# Patient Record
Sex: Male | Born: 1970 | Race: White | Hispanic: No | Smoking: Current every day smoker
Health system: Southern US, Community
[De-identification: ages and names within clinical notes are randomized; demographics above are authoritative.]

## PROBLEM LIST (undated history)

## (undated) DIAGNOSIS — K5792 Diverticulitis of intestine, part unspecified, without perforation or abscess without bleeding: Secondary | ICD-10-CM

## (undated) DIAGNOSIS — G4730 Sleep apnea, unspecified: Secondary | ICD-10-CM

## (undated) DIAGNOSIS — G2581 Restless legs syndrome: Secondary | ICD-10-CM

## (undated) DIAGNOSIS — E785 Hyperlipidemia, unspecified: Secondary | ICD-10-CM

## (undated) DIAGNOSIS — F419 Anxiety disorder, unspecified: Secondary | ICD-10-CM

## (undated) DIAGNOSIS — I2510 Atherosclerotic heart disease of native coronary artery without angina pectoris: Principal | ICD-10-CM

## (undated) DIAGNOSIS — F32A Depression, unspecified: Secondary | ICD-10-CM

## (undated) DIAGNOSIS — M1990 Unspecified osteoarthritis, unspecified site: Secondary | ICD-10-CM

## (undated) DIAGNOSIS — I219 Acute myocardial infarction, unspecified: Secondary | ICD-10-CM

## (undated) DIAGNOSIS — R7303 Prediabetes: Secondary | ICD-10-CM

## (undated) DIAGNOSIS — J449 Chronic obstructive pulmonary disease, unspecified: Secondary | ICD-10-CM

## (undated) DIAGNOSIS — I252 Old myocardial infarction: Secondary | ICD-10-CM

## (undated) HISTORY — DX: Atherosclerotic heart disease of native coronary artery without angina pectoris: I25.10

## (undated) HISTORY — PX: CARDIAC CATHETERIZATION: SHX172

## (undated) HISTORY — DX: Anxiety disorder, unspecified: F41.9

## (undated) HISTORY — DX: Unspecified osteoarthritis, unspecified site: M19.90

## (undated) HISTORY — DX: Old myocardial infarction: I25.2

## (undated) HISTORY — DX: Sleep apnea, unspecified: G47.30

## (undated) HISTORY — DX: Diverticulitis of intestine, part unspecified, without perforation or abscess without bleeding: K57.92

## (undated) HISTORY — DX: Depression, unspecified: F32.A

## (undated) HISTORY — DX: Restless legs syndrome: G25.81

## (undated) HISTORY — DX: Hyperlipidemia, unspecified: E78.5

---

## 2014-01-23 HISTORY — PX: COLONOSCOPY: SHX174

## 2015-04-11 ENCOUNTER — Ambulatory Visit (INDEPENDENT_AMBULATORY_CARE_PROVIDER_SITE_OTHER): Payer: PRIVATE HEALTH INSURANCE

## 2015-04-11 ENCOUNTER — Ambulatory Visit (INDEPENDENT_AMBULATORY_CARE_PROVIDER_SITE_OTHER): Payer: PRIVATE HEALTH INSURANCE | Admitting: Sports Medicine

## 2015-04-11 ENCOUNTER — Encounter: Payer: Self-pay | Admitting: Sports Medicine

## 2015-04-11 DIAGNOSIS — B079 Viral wart, unspecified: Secondary | ICD-10-CM

## 2015-04-11 DIAGNOSIS — M79671 Pain in right foot: Secondary | ICD-10-CM

## 2015-04-11 DIAGNOSIS — M899 Disorder of bone, unspecified: Secondary | ICD-10-CM

## 2015-04-11 DIAGNOSIS — L603 Nail dystrophy: Secondary | ICD-10-CM | POA: Diagnosis not present

## 2015-04-11 NOTE — Progress Notes (Signed)
Patient ID: Billy Cummings, male   DOB: Aug 30, 1970, 44 y.o.   MRN: [id number] Subjective: Billy Cummings is a 44 y.o. male patient who presents to office for evaluation of Right foot pain. Patient complains of progressive pain especially over the last few months at the second toe where there is a callus lesion, which he routinely trims himself to alleviate pain. Patient also states that the lesion tends to build up and never appears to be getting better. Patient also admits to pain in ball of foot. States that this has been going on for several months as well made worse with long hours of standing admits to no treatment for this concern. Patient is also concerned of nail thickness and discoloration, especially at the big toes and fourth and fifth toes. Patient has tried over-the-counter medications with no improvement in appearance of nails. Patient denies any other pedal complaints. Denies injury/trip/fall/sprain/any causative factors.   Review of Systems  All other systems reviewed and are negative.  There are no active problems to display for this patient.  No current outpatient prescriptions on file prior to visit.   No current facility-administered medications on file prior to visit.   No Known Allergies   Objective:  General: Alert and oriented x3 in no acute distress  Dermatology: There is a less than 0.5 cm keratotic lesion once pared revealing multiple pinpoint capillaries suggestive of wart at the plantar aspect of the right second toe, No open lesions bilateral lower extremities, no webspace macerations, no ecchymosis bilateral, all nails x 10 are mildly dystrophic, thickened, yellow, with most thickening noted at bilateral hallux and fourth and fifth toes. There is also generalized dryness and peeling skin however asymptomatic possible tinea.   Vascular: Dorsalis Pedis and Posterior Tibial pedal pulses 2/4, Capillary Fill Time 3 seconds,(+) pedal hair growth bilateral, no edema bilateral  lower extremities, Temperature gradient within normal limits.  Neurology: Gross sensation intact via light touch bilateral, No babinski sign present bilateral. (- )Tinels sign.   Musculoskeletal: There is moderate tenderness to palpation of the right second toe at area of warty lesion Mild tenderness with palpation at fibular sesamoid, right foot,No pain with calf compression bilateral. Ankle and pedal joint range of motion within normal limits. Strength within normal limits in all groups bilateral.   Xrays  Right Foot 3 views    Impression: Normal osseous mineralization. Joint spaces well preserved. There is mild narrowing at the sesamoid complex and not clear visualization of the grooves that the sesamoid sits in underneath the first metatarsal head, likely suggestive of arthritis. There are no fractures or dislocations. Mild hammertoe deformity present. Soft tissues within normal limits, no foreign body.  Assessment and Plan: Problem List Items Addressed This Visit    None    Visit Diagnoses    Right foot pain    -  Primary    Relevant Orders    DG Foot 2 Views Right    Viral warts        Right second toe    Relevant Orders    Fungus Culture with Smear    Sesamoid pain        Likely secondary to mechanical overload versus arthritis    Nail dystrophy        Possible onychomycosis    Relevant Orders    Fungus Culture with Smear       -Complete examination performed -Xrays reviewed -Discussed treatement options -Mechanically debrided warty lesion using a sterile chisel blade at right second  toe until pinpoint bleeding and applied chemical Cantharone covered with Band-Aid. Advised patient on blistering reaction and appropriate care. -Patient metatarsal pad for ball of foot/ sesamoid pain with instructions on use and application. Informed patient that if these pads work well that we will make custom orthotic with sesamoid accommodation. -Fungal Culture was obtained from all nails and  sent to lab for analysis for possible onychomycosis; will further discuss treatment options for fungus pending lab results -Recommend good supportive shoes daily for foot type. -Patient to return to office in 2 weeks or sooner if condition worsens.  Landis Martins, DPM

## 2015-04-25 ENCOUNTER — Ambulatory Visit (INDEPENDENT_AMBULATORY_CARE_PROVIDER_SITE_OTHER): Payer: PRIVATE HEALTH INSURANCE | Admitting: Sports Medicine

## 2015-04-25 ENCOUNTER — Encounter: Payer: Self-pay | Admitting: Sports Medicine

## 2015-04-25 DIAGNOSIS — B079 Viral wart, unspecified: Secondary | ICD-10-CM

## 2015-04-25 DIAGNOSIS — M899 Disorder of bone, unspecified: Secondary | ICD-10-CM

## 2015-04-25 DIAGNOSIS — B351 Tinea unguium: Secondary | ICD-10-CM

## 2015-04-25 DIAGNOSIS — M79671 Pain in right foot: Secondary | ICD-10-CM | POA: Diagnosis not present

## 2015-04-25 DIAGNOSIS — B070 Plantar wart: Secondary | ICD-10-CM | POA: Diagnosis not present

## 2015-04-25 NOTE — Progress Notes (Signed)
Patient ID: Billy Cummings, male   DOB: 26-Sep-1970, 45 y.o.   MRN: [id number]  Subjective: Billy Cummings is a 45 y.o. male patient who returns to office for follow up evaluation of Right foot pain. Patient states that wart site is feeling better and that he got the blister reaction and applied neosporin and bandaid with no problems. Patient states that met pad helped pain in ball of foot. Also is here for Fungal culture results. No other issues.   There are no active problems to display for this patient.  No current outpatient prescriptions on file prior to visit.   No current facility-administered medications on file prior to visit.   No Known Allergies   Objective:  General: Alert and oriented x3 in no acute distress  Dermatology: There is a less than 0.4 cm keratotic lesion once pared revealing multiple pinpoint capillaries suggestive of wart at the plantar aspect of the right second toe, No open lesions bilateral lower extremities, no webspace macerations, no ecchymosis bilateral, all nails x 10 are mildly dystrophic, thickened, yellow, with most thickening noted at bilateral hallux and fourth and fifth toes. There is also generalized dryness and peeling skin however asymptomatic possible tinea.   Vascular: Dorsalis Pedis and Posterior Tibial pedal pulses 2/4, Capillary Fill Time 3 seconds,(+) pedal hair growth bilateral, no edema bilateral lower extremities, Temperature gradient within normal limits.  Neurology: Gross sensation intact via light touch bilateral, No babinski sign present bilateral. (- )Tinels sign.   Musculoskeletal: There is decreased tenderness to palpation of the right second toe at area of warty lesion, No tenderness with palpation at fibular sesamoid, right foot,No pain with calf compression bilateral. Ankle and pedal joint range of motion within normal limits. Strength within normal limits in all groups bilateral.   Assessment and Plan: Problem List Items Addressed This  Visit    None    Visit Diagnoses    Right foot pain    -  Primary    Viral warts        R 2nd toe    Sesamoid pain        resolved    Dermatophytosis of nail        + Fungal culture, T. Rubrum     Relevant Orders    Hepatic Function Panel    CBC with Differential    Basic Metabolic Panel      -Complete examination performed -Discussed treatement options -Fungal culture results reviewed; ordered labs and will review to start Lamisil if within normal limits; will call patient. -Mechanically debrided warty lesion using a sterile chisel blade at right second toe until pinpoint bleeding and applied chemical Cantharone covered with Band-Aid. Advised patient on blistering reaction and appropriate care. -Gave patient metatarsal pad for ball of foot/ sesamoid pain with instructions on use and application. Office to check orthotic insurance coverage and will consider scanning at next visit and adding met pad accomodation. -Recommend good supportive shoes daily for foot type. -Patient to return to office in 4-6 weeks or sooner if condition worsens.  Landis Martins, DPM

## 2015-05-15 ENCOUNTER — Encounter: Payer: Self-pay | Admitting: Sports Medicine

## 2015-05-22 ENCOUNTER — Telehealth: Payer: Self-pay | Admitting: Sports Medicine

## 2015-05-22 NOTE — Telephone Encounter (Signed)
Discussed blood-work with patient; elevated WBC; patient reports that he had his labs work reviewed by [employer] who thinks that elevation is from Smoking; patient is to have repeat blood-work done in the next few weeks of which he will bring with him at next appointment for further review and consideration to start Lamisil. -Dr. Cannon Kettle

## 2015-06-06 ENCOUNTER — Ambulatory Visit: Payer: PRIVATE HEALTH INSURANCE | Admitting: Sports Medicine

## 2015-08-15 HISTORY — PX: CORONARY ANGIOPLASTY WITH STENT PLACEMENT: SHX49

## 2015-08-29 DIAGNOSIS — F17210 Nicotine dependence, cigarettes, uncomplicated: Secondary | ICD-10-CM

## 2015-08-29 DIAGNOSIS — I2510 Atherosclerotic heart disease of native coronary artery without angina pectoris: Secondary | ICD-10-CM | POA: Insufficient documentation

## 2015-08-29 DIAGNOSIS — E785 Hyperlipidemia, unspecified: Secondary | ICD-10-CM

## 2015-08-29 HISTORY — DX: Nicotine dependence, cigarettes, uncomplicated: F17.210

## 2015-08-29 HISTORY — DX: Hyperlipidemia, unspecified: E78.5

## 2015-08-29 HISTORY — DX: Atherosclerotic heart disease of native coronary artery without angina pectoris: I25.10

## 2017-04-08 ENCOUNTER — Ambulatory Visit (INDEPENDENT_AMBULATORY_CARE_PROVIDER_SITE_OTHER): Payer: No Typology Code available for payment source | Admitting: Cardiology

## 2017-04-08 ENCOUNTER — Encounter: Payer: Self-pay | Admitting: *Deleted

## 2017-04-08 ENCOUNTER — Other Ambulatory Visit: Payer: Self-pay | Admitting: *Deleted

## 2017-04-08 VITALS — BP 124/80 | HR 88 | Ht 74.0 in | Wt 215.0 lb

## 2017-04-08 DIAGNOSIS — I2510 Atherosclerotic heart disease of native coronary artery without angina pectoris: Secondary | ICD-10-CM | POA: Diagnosis not present

## 2017-04-08 DIAGNOSIS — E785 Hyperlipidemia, unspecified: Secondary | ICD-10-CM | POA: Diagnosis not present

## 2017-04-08 DIAGNOSIS — J020 Streptococcal pharyngitis: Secondary | ICD-10-CM | POA: Diagnosis not present

## 2017-04-08 DIAGNOSIS — R079 Chest pain, unspecified: Secondary | ICD-10-CM

## 2017-04-08 DIAGNOSIS — F17210 Nicotine dependence, cigarettes, uncomplicated: Secondary | ICD-10-CM

## 2017-04-08 NOTE — Patient Instructions (Signed)
Medication Instructions:  Your physician recommends that you continue on your current medications as directed. Please refer to the Current Medication list given to you today.  Labwork: None  Testing/Procedures: Your physician has requested that you have a lexiscan myoview. For further information please visit [email or web address]. Please follow instruction sheet, as given.   Follow-Up: Your physician recommends that you schedule a follow-up appointment in: 1 month  Any Other Special Instructions Will Be Listed Below (If Applicable).     If you need a refill on your cardiac medications before your next appointment, please call your pharmacy.   [place], RN, BSN

## 2017-04-08 NOTE — Progress Notes (Signed)
Cardiology Office Note:    Date:  04/08/2017   ID:  Billy Cummings, DOB 31-Aug-1970, MRN [id number]  PCP:  System, Pcp Not In  Cardiologist:  Jenean Lindau, MD   Referring MD: No ref. provider found    ASSESSMENT:    1. Coronary artery disease involving native coronary artery of native heart without angina pectoris   2. Strep throat   3. Cigarette smoker   4. Dyslipidemia   5. Chest pain, unspecified type    PLAN:    In order of problems listed above:  1. Secondary prevention stressed with the patient.  Importance of compliance with diet and medications stressed and he vocalized understanding. 2. I spent 5 minutes with the patient discussing solely about smoking. Smoking cessation was counseled. I suggested to the patient also different medications and pharmacological interventions. Patient is keen to try stopping on its own at this time. He will get back to me if he needs any further assistance in this matter. 3. His blood pressure stable I told him to keep himself well-hydrated. 4. I mentioned to the patient that he will need some form of stress testing to evaluate for objective evidence of coronary artery disease.  He is agreeable and vocalized understanding. 5. His lipids and other blood work was checked at his workplace and he will get me a copy of these when he comes to see me in follow-up appointment in a month. 6. He knows to go to the nearest emergency room for any concerning symptoms.   Medication Adjustments/Labs and Tests Ordered: Current medicines are reviewed at length with the patient today.  Concerns regarding medicines are outlined above.  Orders Placed This Encounter  Procedures  . MYOCARDIAL PERFUSION IMAGING   No orders of the defined types were placed in this encounter.    History of Present Illness:    Billy Cummings is a 46 y.o. male who is being seen today for the evaluation of chest pain at the request of No ref. provider found.  This patient has been  under my care in my previous practice.  He is here now to transfer his care and be established with my current practice.  Patient has known coronary artery disease and has coronary stenting and restenting for the restenosis.  Unfortunately continues to smoke.  Is an active gentleman.  He does not exercise on a regular basis.  He tells me that he has chest tightness at times.  This is without any exercise.  When he is active and ambulating or sexually active he does not have any chest pain.  No orthopnea or PND.  No syncope.  No radiation of the symptoms to the neck or to any part of the body.  At the time of my evaluation, the patient is alert awake oriented and in no distress.  Past Medical History:  Diagnosis Date  . Coronary artery disease involving native coronary artery of native heart without angina pectoris 08/29/2015   Overview:  STEMI -late restenosis ,inferior wall--stenting of RCA x 2 08/15/15 and old PCI and stent of RCA in 2012  . Dyslipidemia 08/29/2015    Past Surgical History:  Procedure Laterality Date  . CORONARY ANGIOPLASTY WITH STENT PLACEMENT Left 08/15/2015    Current Medications: Current Meds  Medication Sig  . amoxicillin-clavulanate (AUGMENTIN) 875-125 MG tablet Take 1 tablet by mouth 2 (two) times daily. For 10 days  . aspirin 81 MG chewable tablet Chew 81 mg by mouth.  Marland Kitchen atorvastatin (LIPITOR) 40  MG tablet Take 1 tablet once a day for high cholesterol  . BRILINTA 90 MG TABS tablet Take 90 mg by mouth 2 (two) times daily.  . Coenzyme Q-10 200 MG CAPS Take 200 mg by mouth.  . fenofibrate 160 MG tablet Take 160 mg by mouth daily.     Allergies:   Patient has no known allergies.   Social History   Socioeconomic History  . Marital status: Single    Spouse name: None  . Number of children: None  . Years of education: None  . Highest education level: None  Social Needs  . Financial resource strain: None  . Food insecurity - worry: None  . Food insecurity -  inability: None  . Transportation needs - medical: None  . Transportation needs - non-medical: None  Occupational History  . None  Tobacco Use  . Smoking status: Current Every Day Smoker  . Smokeless tobacco: Never Used  Substance and Sexual Activity  . Alcohol use: No    Alcohol/week: 0.0 oz    Frequency: Never  . Drug use: No  . Sexual activity: None  Other Topics Concern  . None  Social History Narrative  . None     Family History: The patient's family history includes Heart attack in his brother, father, and mother; Heart disease in his brother, father, and mother.  ROS:   Please see the history of present illness.    All other systems reviewed and are negative.  EKGs/Labs/Other Studies Reviewed:    The following studies were reviewed today: I discussed my findings with the patient at extensive length.  His coronary angiography report was discussed with the patient this was done in 2017   Recent Labs: No results found for requested labs within last 8760 hours.  Recent Lipid Panel No results found for: CHOL, TRIG, HDL, CHOLHDL, VLDL, LDLCALC, LDLDIRECT  Physical Exam:    VS:  BP 124/80 (BP Location: Left Arm, Patient Position: Sitting)   Pulse 88   Ht 6\' 2"  (1.88 m)   Wt 215 lb (97.5 kg)   SpO2 98%   BMI 27.60 kg/m     Wt Readings from Last 3 Encounters:  04/08/17 215 lb (97.5 kg)     GEN: Patient is in no acute distress HEENT: Normal NECK: No JVD; No carotid bruits LYMPHATICS: No lymphadenopathy CARDIAC: S1 S2 regular, 2/6 systolic murmur at the apex. RESPIRATORY:  Clear to auscultation without rales, wheezing or rhonchi  ABDOMEN: Soft, non-tender, non-distended MUSCULOSKELETAL:  No edema; No deformity  SKIN: Warm and dry NEUROLOGIC:  Alert and oriented x 3 PSYCHIATRIC:  Normal affect    Signed, Jenean Lindau, MD  04/08/2017 9:58 AM    [facility]

## 2017-04-10 ENCOUNTER — Other Ambulatory Visit: Payer: Self-pay

## 2017-04-10 ENCOUNTER — Telehealth: Payer: Self-pay

## 2017-04-10 DIAGNOSIS — E785 Hyperlipidemia, unspecified: Secondary | ICD-10-CM

## 2017-04-10 DIAGNOSIS — R079 Chest pain, unspecified: Secondary | ICD-10-CM

## 2017-04-10 MED ORDER — ICOSAPENT ETHYL 1 G PO CAPS: 2.0000 | ORAL_CAPSULE | Freq: Two times a day (BID) | ORAL | 1 refills | Status: DC

## 2017-04-10 NOTE — Telephone Encounter (Signed)
Informed patient of his lab results. Instructed patient that new medication would be added and repeat labs would be needed in 6 weeks. Educated patient regarding dieting; patient was agreeable to everything.

## 2017-04-15 ENCOUNTER — Telehealth (HOSPITAL_COMMUNITY): Payer: Self-pay | Admitting: *Deleted

## 2017-04-15 ENCOUNTER — Telehealth: Payer: Self-pay

## 2017-04-15 NOTE — Telephone Encounter (Signed)
Patient called stating that he was told to be at [facility] on 12/26 for lexi; no appointment could be found. Patient was given new date and time for lexi at [place]. Appointment made for 12/28 at 11:00. Prep information given to patient.

## 2017-04-15 NOTE — Telephone Encounter (Signed)
Patient given detailed instructions per Myocardial Perfusion Study Information Sheet for the test on 04/17/17 at 11:00. Patient notified to arrive 15 minutes early and that it is imperative to arrive on time for appointment to keep from having the test rescheduled.  If you need to cancel or reschedule your appointment, please call the office within 24 hours of your appointment. . Patient verbalized understanding.Billy Cummings

## 2017-04-17 ENCOUNTER — Encounter (HOSPITAL_COMMUNITY): Payer: No Typology Code available for payment source

## 2017-04-20 ENCOUNTER — Telehealth (HOSPITAL_COMMUNITY): Payer: Self-pay | Admitting: *Deleted

## 2017-04-20 NOTE — Telephone Encounter (Signed)
Left message on voicemail per DPR in reference to upcoming appointment scheduled on 04/24/17 at 0815 with detailed instructions given per Myocardial Perfusion Study Information Sheet for the test. LM to arrive 15 minutes early, and that it is imperative to arrive on time for appointment to keep from having the test rescheduled. If you need to cancel or reschedule your appointment, please call the office within 24 hours of your appointment. Failure to do so may result in a cancellation of your appointment, and a $50 no show fee. Phone number given for call back for any questions.

## 2017-04-24 ENCOUNTER — Ambulatory Visit (HOSPITAL_COMMUNITY): Payer: No Typology Code available for payment source | Attending: Cardiology

## 2017-04-24 DIAGNOSIS — R079 Chest pain, unspecified: Secondary | ICD-10-CM | POA: Insufficient documentation

## 2017-04-24 LAB — MYOCARDIAL PERFUSION IMAGING
CHL CUP MPHR: 174 {beats}/min
CHL CUP NUCLEAR SDS: 3
CHL CUP NUCLEAR SRS: 0
CHL CUP NUCLEAR SSS: 3
CHL CUP RESTING HR STRESS: 80 {beats}/min
CSEPED: 9 min
CSEPEW: 10.1 METS
CSEPHR: 86 %
CSEPPHR: 151 {beats}/min
Exercise duration (sec): 31 s
LV dias vol: 122 mL (ref 62–150)
LV sys vol: 47 mL
RATE: 0.39
TID: 0.91

## 2017-04-24 MED ORDER — TECHNETIUM TC 99M TETROFOSMIN IV KIT
10.5000 | PACK | Freq: Once | INTRAVENOUS | Status: AC | PRN
  Administered 2017-04-24: 10.5 via INTRAVENOUS
  Filled 2017-04-24: qty 11

## 2017-04-24 MED ORDER — TECHNETIUM TC 99M TETROFOSMIN IV KIT
32.3000 | PACK | Freq: Once | INTRAVENOUS | Status: AC | PRN
  Administered 2017-04-24: 32.3 via INTRAVENOUS
  Filled 2017-04-24: qty 33

## 2017-05-04 ENCOUNTER — Other Ambulatory Visit: Payer: Self-pay

## 2017-05-04 NOTE — Telephone Encounter (Signed)
Left voicemail and faxed to pharmacy that patient needs follow up liver/lipid in order for refill to be sent.

## 2017-05-11 ENCOUNTER — Other Ambulatory Visit: Payer: Self-pay

## 2017-05-11 ENCOUNTER — Ambulatory Visit (INDEPENDENT_AMBULATORY_CARE_PROVIDER_SITE_OTHER): Payer: No Typology Code available for payment source | Admitting: Cardiology

## 2017-05-11 ENCOUNTER — Encounter: Payer: Self-pay | Admitting: Cardiology

## 2017-05-11 VITALS — BP 128/94 | HR 76 | Ht 74.0 in | Wt 220.0 lb

## 2017-05-11 DIAGNOSIS — E781 Pure hyperglyceridemia: Secondary | ICD-10-CM | POA: Insufficient documentation

## 2017-05-11 DIAGNOSIS — F17210 Nicotine dependence, cigarettes, uncomplicated: Secondary | ICD-10-CM | POA: Diagnosis not present

## 2017-05-11 DIAGNOSIS — I2510 Atherosclerotic heart disease of native coronary artery without angina pectoris: Secondary | ICD-10-CM | POA: Diagnosis not present

## 2017-05-11 HISTORY — DX: Pure hyperglyceridemia: E78.1

## 2017-05-11 MED ORDER — ATORVASTATIN CALCIUM 80 MG PO TABS: 80.0000 mg | ORAL_TABLET | Freq: Every day | ORAL | 1 refills | Status: DC

## 2017-05-11 NOTE — Patient Instructions (Addendum)
Medication Instructions:  Your physician has recommended you make the following change in your medication:  CHANGE lipitor to 80 mg daily  Labwork: Your physician recommends that you have the following labs drawn: fasting liver and lipid panel in 6 weeks  Testing/Procedures: None  Follow-Up: Your physician recommends that you schedule a follow-up appointment in: 3 months  Any Other Special Instructions Will Be Listed Below (If Applicable).   Fat and Cholesterol Restricted Diet Getting too much fat and cholesterol in your diet may cause health problems. Following this diet helps keep your fat and cholesterol at normal levels. This can keep you from getting sick. What types of fat should I choose?  Choose monosaturated and polyunsaturated fats. These are found in foods such as olive oil, canola oil, flaxseeds, walnuts, almonds, and seeds.  Eat more omega-3 fats. Good choices include salmon, mackerel, sardines, tuna, flaxseed oil, and ground flaxseeds.  Limit saturated fats. These are in animal products such as meats, butter, and cream. They can also be in plant products such as palm oil, palm kernel oil, and coconut oil.  Avoid foods with partially hydrogenated oils in them. These contain trans fats. Examples of foods that have trans fats are stick margarine, some tub margarines, cookies, crackers, and other baked goods. What general guidelines do I need to follow?  Check food labels. Look for the words "trans fat" and "saturated fat."  When preparing a meal: ? Fill half of your plate with vegetables and green salads. ? Fill one fourth of your plate with whole grains. Look for the word "whole" as the first word in the ingredient list. ? Fill one fourth of your plate with lean protein foods.  Eat more foods that have fiber, like apples, carrots, beans, peas, and barley.  Eat more home-cooked foods. Eat less at restaurants and buffets.  Limit or avoid alcohol.  Limit foods high  in starch and sugar.  Limit fried foods.  [place] foods without frying them. Baking, boiling, grilling, and broiling are all great options.  Lose weight if you are overweight. Losing even a small amount of weight can help your overall health. It can also help prevent diseases such as diabetes and heart disease. What foods can I eat? Grains Whole grains, such as whole wheat or whole grain breads, crackers, cereals, and pasta. Unsweetened oatmeal, bulgur, barley, quinoa, or brown rice. Corn or whole wheat flour tortillas. Vegetables Fresh or frozen vegetables (raw, steamed, roasted, or grilled). Green salads. Fruits All fresh, canned (in natural juice), or frozen fruits. Meat and Other Protein Products Ground beef (85% or leaner), grass-fed beef, or beef trimmed of fat. Skinless chicken or [place]. Ground chicken or [place]. Pork trimmed of fat. All fish and seafood. Eggs. Dried beans, peas, or lentils. Unsalted nuts or seeds. Unsalted canned or dry beans. Dairy Low-fat dairy products, such as skim or 1% milk, 2% or reduced-fat cheeses, low-fat ricotta or cottage cheese, or plain low-fat yogurt. Fats and Oils Tub margarines without trans fats. Light or reduced-fat mayonnaise and salad dressings. Avocado. Olive, canola, sesame, or safflower oils. Natural peanut or almond butter (choose ones without added sugar and oil). The items listed above may not be a complete list of recommended foods or beverages. Contact your dietitian for more options. What foods are not recommended? Grains White bread. White pasta. White rice. Cornbread. Bagels, pastries, and croissants. Crackers that contain trans fat. Vegetables White potatoes. Corn. Creamed or fried vegetables. Vegetables in a cheese sauce. Fruits Dried fruits. Canned  fruit in light or heavy syrup. Fruit juice. Meat and Other Protein Products Fatty cuts of meat. Ribs, chicken wings, bacon, sausage, bologna, salami, chitterlings, fatback, hot dogs,  bratwurst, and packaged luncheon meats. Liver and organ meats. Dairy Whole or 2% milk, cream, half-and-half, and cream cheese. Whole milk cheeses. Whole-fat or sweetened yogurt. Full-fat cheeses. Nondairy creamers and whipped toppings. Processed cheese, cheese spreads, or cheese curds. Sweets and Desserts Corn syrup, sugars, honey, and molasses. Candy. Jam and jelly. Syrup. Sweetened cereals. Cookies, pies, cakes, donuts, muffins, and ice cream. Fats and Oils Butter, stick margarine, lard, shortening, ghee, or bacon fat. Coconut, palm kernel, or palm oils. Beverages Alcohol. Sweetened drinks (such as sodas, lemonade, and fruit drinks or punches). The items listed above may not be a complete list of foods and beverages to avoid. Contact your dietitian for more information. This information is not intended to replace advice given to you by your health care provider. Make sure you discuss any questions you have with your health care provider. Document Released: 10/07/2011 Document Revised: 12/13/2015 Document Reviewed: 07/07/2013 Elsevier Interactive Patient Education  [employer].    If you need a refill on your cardiac medications before your next appointment, please call your pharmacy.   [place], RN, BSN

## 2017-05-11 NOTE — Progress Notes (Signed)
Cardiology Office Note:    Date:  05/11/2017   ID:  Billy Cummings, DOB [birth year], MRN [id number]  PCP:  System, Pcp Not In  Cardiologist:  Jenean Lindau, MD   Referring MD: No ref. provider found    ASSESSMENT:    1. Coronary artery disease involving native coronary artery of native heart without angina pectoris   2. Cigarette smoker   3. Hypertriglyceridemia    PLAN:    In order of problems listed above:  1. Secondary prevention stressed with the patient.  Importance of compliance with diet and medications stressed and he vocalized understanding. 2. Importance of regular exercise stressed and I stressed to him the importance of having a daily exercise routine and protocol. 3. Lipids were reviewed and I increased his Lipitor to 80 mg daily.  He will be back in 6 weeks for liver lipid check.  I discussed with him extensively about his triglycerides and diet was outlined.  He plans to and promises to follow this. 4. I spent 5 minutes with the patient discussing solely about smoking. Smoking cessation was counseled. I suggested to the patient also different medications and pharmacological interventions. Patient is keen to try stopping on its own at this time. He will get back to me if he needs any further assistance in this matter.   Medication Adjustments/Labs and Tests Ordered: Current medicines are reviewed at length with the patient today.  Concerns regarding medicines are outlined above.  No orders of the defined types were placed in this encounter.  Meds ordered this encounter  Medications  . atorvastatin (LIPITOR) 80 MG tablet    Sig: Take 1 tablet (80 mg total) by mouth daily at 6 PM.    Dispense:  90 tablet    Refill:  1     Chief Complaint  Patient presents with  . Coronary Artery Disease    1 month f/u     History of Present Illness:    Billy Cummings is a 47 y.o. male.  The patient has known coronary artery disease and market hypertriglyceridemia.  Unfortunately  continues to smoke and leads a sedentary lifestyle.  No chest pain orthopnea or PND.  At the time of my evaluation, the patient is alert awake oriented and in no distress.  Past Medical History:  Diagnosis Date  . Coronary artery disease involving native coronary artery of native heart without angina pectoris 08/29/2015   Overview:  STEMI -late restenosis ,inferior wall--stenting of RCA x 2 08/15/15 and old PCI and stent of RCA in 2012  . Dyslipidemia 08/29/2015    Past Surgical History:  Procedure Laterality Date  . CORONARY ANGIOPLASTY WITH STENT PLACEMENT Left 08/15/2015    Current Medications: Current Meds  Medication Sig  . aspirin 325 MG tablet Take 325 mg by mouth daily.  . Coenzyme Q-10 200 MG CAPS Take 200 mg by mouth.  . DOCOSAHEXAENOIC ACID PO Take by mouth.  . fenofibrate 160 MG tablet Take 160 mg by mouth daily.  [name] 1 g CAPS Take 2 capsules by mouth 2 (two) times daily.  . [DISCONTINUED] atorvastatin (LIPITOR) 40 MG tablet Take 1 tablet once a day for high cholesterol     Allergies:   Patient has no known allergies.   Social History   Socioeconomic History  . Marital status: Single    Spouse name: None  . Number of children: None  . Years of education: None  . Highest education level: None  Social Needs  .  Financial resource strain: None  . Food insecurity - worry: None  . Food insecurity - inability: None  . Transportation needs - medical: None  . Transportation needs - non-medical: None  Occupational History  . None  Tobacco Use  . Smoking status: Current Every Day Smoker  . Smokeless tobacco: Never Used  Substance and Sexual Activity  . Alcohol use: No    Alcohol/week: 0.0 oz    Frequency: Never  . Drug use: No  . Sexual activity: None  Other Topics Concern  . None  Social History Narrative  . None     Family History: The patient's family history includes Heart attack in his brother, father, and mother; Heart disease in his  brother, father, and mother.  ROS:   Please see the history of present illness.    All other systems reviewed and are negative.  EKGs/Labs/Other Studies Reviewed:    The following studies were reviewed today: Patient is brought lab work and I reviewed this with him extensively.  His triglycerides are better but still markedly elevated and I discussed this with him.   Recent Labs: No results found for requested labs within last 8760 hours.  Recent Lipid Panel No results found for: CHOL, TRIG, HDL, CHOLHDL, VLDL, LDLCALC, LDLDIRECT  Physical Exam:    VS:  BP (!) 128/94 (BP Location: Right Arm, Patient Position: Sitting, Cuff Size: Large)   Pulse 76   Ht 6\' 2"  (1.88 m)   Wt 220 lb (99.8 kg)   SpO2 97%   BMI 28.25 kg/m     Wt Readings from Last 3 Encounters:  05/11/17 220 lb (99.8 kg)  04/08/17 215 lb (97.5 kg)     GEN: Patient is in no acute distress HEENT: Normal NECK: No JVD; No carotid bruits LYMPHATICS: No lymphadenopathy CARDIAC: Hear sounds regular, 2/6 systolic murmur at the apex. RESPIRATORY:  Clear to auscultation without rales, wheezing or rhonchi  ABDOMEN: Soft, non-tender, non-distended MUSCULOSKELETAL:  No edema; No deformity  SKIN: Warm and dry NEUROLOGIC:  Alert and oriented x 3 PSYCHIATRIC:  Normal affect   Signed, Jenean Lindau, MD  05/11/2017 10:20 AM    [place]

## 2017-06-04 ENCOUNTER — Telehealth: Payer: Self-pay | Admitting: Cardiology

## 2017-06-04 NOTE — Telephone Encounter (Signed)
Returning your call. °

## 2017-06-05 NOTE — Telephone Encounter (Signed)
Called about his mother's medications; refill sent.

## 2017-07-17 ENCOUNTER — Telehealth: Payer: Self-pay

## 2017-07-17 NOTE — Telephone Encounter (Signed)
Left voicemail regarding overdue liver and lipid panel. Instructed to have these done at the [facility] in [place].

## 2017-09-07 ENCOUNTER — Telehealth: Payer: Self-pay

## 2017-09-07 NOTE — Telephone Encounter (Signed)
Called and left patient a voicemail regarding overdue labs

## 2019-01-17 IMAGING — NM NM MISC PROCEDURE
3 series · 18 of 18 positions shown · non-contrast
Comparison: none

[Series 1: wbr_r-proj_st rest_(id)_sa · 6.5mm · 6.51mm/px · 6 of 64 frames shown]
[frame 6/64]
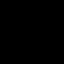
[frame 16/64]
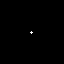
[frame 27/64]
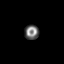
[frame 38/64]
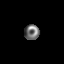
[frame 48/64]
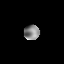
[frame 59/64]
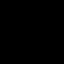

[Series 1: wbr_s-proj_st stress - gated_(id)_sa · 6.5mm · 6.51mm/px · 6 of 512 frames shown]
[frame 43/512]
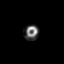
[frame 128/512]
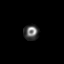
[frame 214/512]
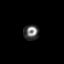
[frame 299/512]
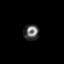
[frame 384/512]
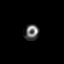
[frame 470/512]
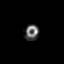

[Series 1: wbr_s-proj_st stress - perfusion_(id)_sa · 6.5mm · 6.51mm/px · 6 of 64 frames shown]
[frame 6/64]
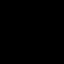
[frame 16/64]
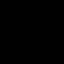
[frame 27/64]
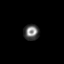
[frame 38/64]
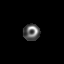
[frame 48/64]
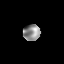
[frame 59/64]
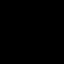

[18 of 18 positions shown; findings below may reference images not displayed]

Canned report from images found in remote index.

Refer to host system for actual result text.

## 2019-04-22 DIAGNOSIS — Z8673 Personal history of transient ischemic attack (TIA), and cerebral infarction without residual deficits: Secondary | ICD-10-CM

## 2019-04-22 HISTORY — DX: Personal history of transient ischemic attack (TIA), and cerebral infarction without residual deficits: Z86.73

## 2019-04-29 ENCOUNTER — Ambulatory Visit: Payer: No Typology Code available for payment source | Admitting: Cardiology

## 2019-05-04 ENCOUNTER — Ambulatory Visit (INDEPENDENT_AMBULATORY_CARE_PROVIDER_SITE_OTHER): Payer: No Typology Code available for payment source | Admitting: Cardiology

## 2019-05-04 ENCOUNTER — Encounter: Payer: Self-pay | Admitting: Cardiology

## 2019-05-04 ENCOUNTER — Other Ambulatory Visit: Payer: Self-pay

## 2019-05-04 VITALS — BP 132/92 | HR 72 | Ht 74.0 in | Wt 234.0 lb

## 2019-05-04 DIAGNOSIS — E781 Pure hyperglyceridemia: Secondary | ICD-10-CM | POA: Diagnosis not present

## 2019-05-04 DIAGNOSIS — I2510 Atherosclerotic heart disease of native coronary artery without angina pectoris: Secondary | ICD-10-CM

## 2019-05-04 DIAGNOSIS — F17210 Nicotine dependence, cigarettes, uncomplicated: Secondary | ICD-10-CM | POA: Diagnosis not present

## 2019-05-04 MED ORDER — ASPIRIN EC 81 MG PO TBEC: 81.0000 mg | DELAYED_RELEASE_TABLET | Freq: Every day | ORAL | 3 refills | Status: DC

## 2019-05-04 MED ORDER — NITROGLYCERIN 0.4 MG SL SUBL: 0.4000 mg | SUBLINGUAL_TABLET | SUBLINGUAL | 3 refills | Status: DC | PRN

## 2019-05-04 NOTE — Patient Instructions (Signed)
Medication Instructions:  Your physician has recommended you make the following change in your medication:  START taking nitroglycerin as needed for chest pain, When having chest pain, stop what you are doing and sit down. Take 1 nitro, wait 5 minutes. Still having chest pain, take 1 nitro, wait 5 minutes. Still having chest pain, take 1 nitro, dial 911. Total of 3 nitro in 15 minutes.   START taking aspirin 81 mg(1 tablet) once daily  *If you need a refill on your cardiac medications before your next appointment, please call your pharmacy*  Lab Work: NONE If you have labs (blood work) drawn today and your tests are completely normal, you will receive your results only by:  [facility] (if you have MyChart) OR  A paper copy in the mail If you have any lab test that is abnormal or we need to change your treatment, we will call you to review the results.  Testing/Procedures: You had an EKG Performed today    Follow-Up: At [facility], you and your health needs are our priority.  As part of our continuing mission to provide you with exceptional heart care, we have created designated Provider Care Teams.  These Care Teams include your primary Cardiologist (physician) and Advanced Practice Providers (APPs -  Physician Assistants and Nurse Practitioners) who all work together to provide you with the care you need, when you need it.  Your next appointment:   6 month(s)  The format for your next appointment:   In Person  Provider:   Jyl Heinz, MD  Other Instructions  Aspirin and Your Heart  Aspirin is a medicine that prevents the cells in the blood that are used for clotting, called platelets, from sticking together. Aspirin can be used to help reduce the risk of blood clots, heart attacks, and other heart-related problems. Can I take aspirin? Your health care provider will help you determine whether it is safe and beneficial for you to take aspirin daily. Taking aspirin  daily may be helpful if you:  Have had a heart attack or chest pain.  Are at risk for a heart attack.  Have undergone open-heart surgery, such as coronary artery bypass surgery (CABG).  Have had coronary angioplasty or a stent.  Have had certain types of stroke or transient ischemic attack (TIA).  Have peripheral artery disease (PAD).  Have chronic heart rhythm problems such as atrial fibrillation and cannot take an anticoagulant.  Have valve disease or have had surgery on a valve. What are the risks? Daily use of aspirin can cause side effects. Some of these include:  Bleeding. Bleeding problems can be minor or serious. An example of a minor problem is a cut that does not stop bleeding. An example of a more serious problem is stomach bleeding or, rarely, bleeding into the brain. Your risk of bleeding is increased if you are also taking non-steroidal anti-inflammatory drugs (NSAIDs).  Increased bruising.  Upset stomach.  An allergic reaction. People who have nasal polyps have an increased risk of developing an aspirin allergy. General guidelines  Take aspirin only as told by your health care provider. Make sure that you understand how much you should take and what form you should take. The two forms of aspirin are: ? Non-enteric-coated.This type of aspirin does not have a coating and is absorbed quickly. This type of aspirin also comes in a chewable form. ? Enteric-coated. This type of aspirin has a coating that releases the medicine very slowly. Enteric-coated aspirin might  cause less stomach upset than non-enteric-coated aspirin. This type of aspirin should not be chewed or crushed.  Limit alcohol intake to no more than 1 drink a day for nonpregnant women and 2 drinks a day for men. Drinking alcohol increases your risk of bleeding. One drink equals 12 oz of beer, 5 oz of wine, or 1 oz of hard liquor. Contact a health care provider if you:  Have unusual bleeding or  bruising.  Have stomach pain or nausea.  Have ringing in your ears.  Have an allergic reaction that causes: ? Hives. ? Itchy skin. ? Swelling of the lips, tongue, or face. Get help right away if you:  Notice that your bowel movements are bloody, dark red, or black in color.  Vomit or cough up blood.  Have blood in your urine.  Cough, have noisy breathing (wheeze), or feel short of breath.  Have chest pain, especially if the pain spreads to the arms, back, neck, or jaw.  Have a severe headache, or a headache with confusion, or dizziness. These symptoms may represent a serious problem that is an emergency. Do not wait to see if the symptoms will go away. Get medical help right away. Call your local emergency services (911 in the [place] Do not drive yourself to the hospital. Summary  Aspirin can be used to help reduce the risk of blood clots, heart attacks, and other heart-related problems.  Daily use of aspirin can increase your risk of side effects. Your health care provider will help you determine whether it is safe and beneficial for you to take aspirin daily.  Take aspirin only as told by your health care provider. Make sure that you understand how much you can take and what form you can take. This information is not intended to replace advice given to you by your health care provider. Make sure you discuss any questions you have with your health care provider. Document Revised: 02/05/2017 Document Reviewed: 02/05/2017 Elsevier Patient Education  [place]. Nitroglycerin sublingual tablets What is this medicine? NITROGLYCERIN (nye troe GLI ser in) is a type of vasodilator. It relaxes blood vessels, increasing the blood and oxygen supply to your heart. This medicine is used to relieve chest pain caused by angina. It is also used to prevent chest pain before activities like climbing stairs, going outdoors in cold weather, or sexual activity. This medicine may be used for  other purposes; ask your health care provider or pharmacist if you have questions. COMMON BRAND NAME(S): Nitroquick, Nitrostat, Nitrotab What should I tell my health care provider before I take this medicine? They need to know if you have any of these conditions:  anemia  head injury, recent stroke, or bleeding in the brain  liver disease  previous heart attack  an unusual or allergic reaction to nitroglycerin, other medicines, foods, dyes, or preservatives  pregnant or trying to get pregnant  breast-feeding How should I use this medicine? Take this medicine by mouth as needed. At the first sign of an angina attack (chest pain or tightness) place one tablet under your tongue. You can also take this medicine 5 to 10 minutes before an event likely to produce chest pain. Follow the directions on the prescription label. Let the tablet dissolve under the tongue. Do not swallow whole. Replace the dose if you accidentally swallow it. It will help if your mouth is not dry. Saliva around the tablet will help it to dissolve more quickly. Do not eat or drink, smoke  or chew tobacco while a tablet is dissolving. If you are not better within 5 minutes after taking ONE dose of nitroglycerin, call 9-1-1 immediately to seek emergency medical care. Do not take more than 3 nitroglycerin tablets over 15 minutes. If you take this medicine often to relieve symptoms of angina, your doctor or health care professional may provide you with different instructions to manage your symptoms. If symptoms do not go away after following these instructions, it is important to call 9-1-1 immediately. Do not take more than 3 nitroglycerin tablets over 15 minutes. Talk to your pediatrician regarding the use of this medicine in children. Special care may be needed. Overdosage: If you think you have taken too much of this medicine contact a poison control center or emergency room at once. NOTE: This medicine is only for you. Do not  share this medicine with others. What if I miss a dose? This does not apply. This medicine is only used as needed. What may interact with this medicine? Do not take this medicine with any of the following medications:  certain migraine medicines like ergotamine and dihydroergotamine (DHE)  medicines used to treat erectile dysfunction like sildenafil, tadalafil, and vardenafil  riociguat This medicine may also interact with the following medications:  alteplase  aspirin  heparin  medicines for high blood pressure  medicines for mental depression  other medicines used to treat angina  phenothiazines like chlorpromazine, mesoridazine, prochlorperazine, thioridazine This list may not describe all possible interactions. Give your health care provider a list of all the medicines, herbs, non-prescription drugs, or dietary supplements you use. Also tell them if you smoke, drink alcohol, or use illegal drugs. Some items may interact with your medicine. What should I watch for while using this medicine? Tell your doctor or health care professional if you feel your medicine is no longer working. Keep this medicine with you at all times. Sit or lie down when you take your medicine to prevent falling if you feel dizzy or faint after using it. Try to remain calm. This will help you to feel better faster. If you feel dizzy, take several deep breaths and lie down with your feet propped up, or bend forward with your head resting between your knees. You may get drowsy or dizzy. Do not drive, use machinery, or do anything that needs mental alertness until you know how this drug affects you. Do not stand or sit up quickly, especially if you are an older patient. This reduces the risk of dizzy or fainting spells. Alcohol can make you more drowsy and dizzy. Avoid alcoholic drinks. Do not treat yourself for coughs, colds, or pain while you are taking this medicine without asking your doctor or health care  professional for advice. Some ingredients may increase your blood pressure. What side effects may I notice from receiving this medicine? Side effects that you should report to your doctor or health care professional as soon as possible:  blurred vision  dry mouth  skin rash  sweating  the feeling of extreme pressure in the head  unusually weak or tired Side effects that usually do not require medical attention (report to your doctor or health care professional if they continue or are bothersome):  flushing of the face or neck  headache  irregular heartbeat, palpitations  nausea, vomiting This list may not describe all possible side effects. Call your doctor for medical advice about side effects. You may report side effects to FDA at 1-800-FDA-1088. Where should I keep  my medicine? Keep out of the reach of children. Store at room temperature between 20 and 25 degrees C (68 and 77 degrees F). Store in [facility]. Protect from light and moisture. Keep tightly closed. Throw away any unused medicine after the expiration date. NOTE: This sheet is a summary. It may not cover all possible information. If you have questions about this medicine, talk to your doctor, pharmacist, or health care provider.  2020 Elsevier/Gold Standard (2013-02-03 17:57:36)

## 2019-05-04 NOTE — Progress Notes (Signed)
Cardiology Office Note:    Date:  05/04/2019   ID:  Billy Cummings, DOB [birth year], MRN [id number]  PCP:  System, Pcp Not In  Cardiologist:  Jenean Lindau, MD   Referring MD: No ref. provider found    ASSESSMENT:    1. Coronary artery disease involving native coronary artery of native heart without angina pectoris   2. Hypertriglyceridemia   3. Cigarette smoker    PLAN:    In order of problems listed above:  1. Coronary artery disease: Secondary prevention stressed with the patient.  Importance of compliance with diet and medication stressed and he vocalized understanding.  Importance of walking at least half an hour a day 5 days a week was stressed and he says he will try to do so. 2. Mixed dyslipidemia: Diet was discussed.  He had blood work at his place about 2 months ago and is on drop a copy in the next few days and I will review it and get back to him. 3. Cigarette smoker: I spent 5 minutes with the patient discussing solely about smoking. Smoking cessation was counseled. I suggested to the patient also different medications and pharmacological interventions. Patient is keen to try stopping on its own at this time. He will get back to me if he needs any further assistance in this matter. 4. Patient will be seen in follow-up appointment in 6 months or earlier if the patient has any concerns 5. Sublingual nitroglycerin prescription was sent, its protocol and 911 protocol explained and the patient vocalized understanding questions were answered to the patient's satisfaction.  He was also advised to take a coated baby aspirin on a regular basis.   Medication Adjustments/Labs and Tests Ordered: Current medicines are reviewed at length with the patient today.  Concerns regarding medicines are outlined above.  No orders of the defined types were placed in this encounter.  No orders of the defined types were placed in this encounter.    Chief Complaint  Patient presents with  .  Follow-up    Annual Visit     History of Present Illness:    Billy Cummings is a 49 y.o. male.  Patient has past medical history of coronary artery disease, and dyslipidemia.  He denies any problems at this time and takes care of activities of daily living.  He leads a sedentary lifestyle.  He smokes on a regular basis.  At the time of my evaluation, the patient is alert awake oriented and in no distress.  Past Medical History:  Diagnosis Date  . Coronary artery disease involving native coronary artery of native heart without angina pectoris 08/29/2015   Overview:  STEMI -late restenosis ,inferior wall--stenting of RCA x 2 08/15/15 and old PCI and stent of RCA in 2012  . Dyslipidemia 08/29/2015    Past Surgical History:  Procedure Laterality Date  . CORONARY ANGIOPLASTY WITH STENT PLACEMENT Left 08/15/2015    Current Medications: Current Meds  Medication Sig  . Coenzyme Q-10 200 MG CAPS Take 200 mg by mouth.  . DOCOSAHEXAENOIC ACID PO Take by mouth.  . famotidine (PEPCID) 20 MG tablet Take 20 mg by mouth daily.  . fenofibrate 160 MG tablet Take 160 mg by mouth daily.  . fluticasone (FLONASE) 50 MCG/ACT nasal spray Place 1 spray into both nostrils daily.  [name] 1 g CAPS Take 2 capsules by mouth 2 (two) times daily.  Marland Kitchen levocetirizine (XYZAL) 5 MG tablet Take 5 mg by mouth at bedtime.  Marland Kitchen  rosuvastatin (CRESTOR) 10 MG tablet Take 10 mg by mouth at bedtime.  . Vitamin D, Ergocalciferol, (DRISDOL) 1.25 MG (50000 UNIT) CAPS capsule Take 50,000 Units by mouth once a week.     Allergies:   Patient has no known allergies.   Social History   Socioeconomic History  . Marital status: Single    Spouse name: Not on file  . Number of children: Not on file  . Years of education: Not on file  . Highest education level: Not on file  Occupational History  . Not on file  Tobacco Use  . Smoking status: Current Every Day Smoker  . Smokeless tobacco: Never Used  Substance and Sexual  Activity  . Alcohol use: No    Alcohol/week: 0.0 standard drinks  . Drug use: No  . Sexual activity: Not on file  Other Topics Concern  . Not on file  Social History Narrative  . Not on file   Social Determinants of Health   Financial Resource Strain:   . Difficulty of Paying Living Expenses: Not on file  Food Insecurity:   . Worried About Charity fundraiser in the Last Year: Not on file  . Ran Out of Food in the Last Year: Not on file  Transportation Needs:   . Lack of Transportation (Medical): Not on file  . Lack of Transportation (Non-Medical): Not on file  Physical Activity:   . Days of Exercise per Week: Not on file  . Minutes of Exercise per Session: Not on file  Stress:   . Feeling of Stress : Not on file  Social Connections:   . Frequency of Communication with Friends and Family: Not on file  . Frequency of Social Gatherings with Friends and Family: Not on file  . Attends Religious Services: Not on file  . Active Member of Clubs or Organizations: Not on file  . Attends Archivist Meetings: Not on file  . Marital Status: Not on file     Family History: The patient's family history includes Heart attack in his brother, father, and mother; Heart disease in his brother, father, and mother.  ROS:   Please see the history of present illness.    All other systems reviewed and are negative.  EKGs/Labs/Other Studies Reviewed:    The following studies were reviewed today: I discussed my findings with the patient at length   Recent Labs: No results found for requested labs within last 8760 hours.  Recent Lipid Panel No results found for: CHOL, TRIG, HDL, CHOLHDL, VLDL, LDLCALC, LDLDIRECT  Physical Exam:    VS:  BP (!) 132/92   Pulse 72   Ht 6\' 2"  (1.88 m)   Wt 234 lb (106.1 kg)   BMI 30.04 kg/m     Wt Readings from Last 3 Encounters:  05/04/19 234 lb (106.1 kg)  05/11/17 220 lb (99.8 kg)  04/08/17 215 lb (97.5 kg)     GEN: Patient is in no  acute distress HEENT: Normal NECK: No JVD; No carotid bruits LYMPHATICS: No lymphadenopathy CARDIAC: Hear sounds regular, 2/6 systolic murmur at the apex. RESPIRATORY:  Clear to auscultation without rales, wheezing or rhonchi  ABDOMEN: Soft, non-tender, non-distended MUSCULOSKELETAL:  No edema; No deformity  SKIN: Warm and dry NEUROLOGIC:  Alert and oriented x 3 PSYCHIATRIC:  Normal affect   Signed, Jenean Lindau, MD  05/04/2019 3:18 PM    [facility]

## 2020-02-23 ENCOUNTER — Ambulatory Visit (INDEPENDENT_AMBULATORY_CARE_PROVIDER_SITE_OTHER): Payer: No Typology Code available for payment source | Admitting: Cardiology

## 2020-02-23 ENCOUNTER — Encounter: Payer: Self-pay | Admitting: Cardiology

## 2020-02-23 ENCOUNTER — Other Ambulatory Visit: Payer: Self-pay

## 2020-02-23 VITALS — BP 157/97 | HR 70 | Ht 74.0 in | Wt 243.2 lb

## 2020-02-23 DIAGNOSIS — F17210 Nicotine dependence, cigarettes, uncomplicated: Secondary | ICD-10-CM

## 2020-02-23 DIAGNOSIS — I2510 Atherosclerotic heart disease of native coronary artery without angina pectoris: Secondary | ICD-10-CM | POA: Diagnosis not present

## 2020-02-23 DIAGNOSIS — E781 Pure hyperglyceridemia: Secondary | ICD-10-CM

## 2020-02-23 DIAGNOSIS — I10 Essential (primary) hypertension: Secondary | ICD-10-CM

## 2020-02-23 HISTORY — DX: Essential (primary) hypertension: I10

## 2020-02-23 MED ORDER — LOSARTAN POTASSIUM 50 MG PO TABS: 50.0000 mg | ORAL_TABLET | Freq: Every day | ORAL | 3 refills | Status: DC

## 2020-02-23 NOTE — Progress Notes (Signed)
Cardiology Office Note:    Date:  02/23/2020   ID:  Billy Cummings, DOB Feb 19, 1971, MRN [id number]  PCP:  Pc, [facility]  Cardiologist:  Billy Lindau, MD   Referring MD: No ref. provider found    ASSESSMENT:    1. Coronary artery disease involving native coronary artery of native heart without angina pectoris   2. Hypertriglyceridemia   3. Cigarette smoker   4. Essential hypertension    PLAN:    In order of problems listed above:  1. Coronary artery disease: Secondary prevention stressed with the patient.  Importance of compliance with diet medication stressed and she vocalized understanding. 2. Essential hypertension: Blood pressure is elevated.  Lifestyle modification and salt intake issues were discussed.  He was advised to take Cozaar 50 mg daily.  He will be back in 1 week for complete blood work including fasting lipids 3. Mixed dyslipidemia: Diet was emphasized.  He will be back in a week for blood work and I will advise him accordingly. 4. Cigarette smoker: I spent 5 minutes with the patient discussing solely about smoking. Smoking cessation was counseled. I suggested to the patient also different medications and pharmacological interventions. Patient is keen to try stopping on its own at this time. He will get back to me if he needs any further assistance in this matter. 5. Patient will be seen in follow-up appointment in 4 months or earlier if the patient has any concerns    Medication Adjustments/Labs and Tests Ordered: Current medicines are reviewed at length with the patient today.  Concerns regarding medicines are outlined above.  Orders Placed This Encounter  Procedures  . Basic metabolic panel  . CBC with Differential/Platelet  . Hepatic function panel  . Lipid panel  . TSH   Meds ordered this encounter  Medications  . losartan (COZAAR) 50 MG tablet    Sig: Take 1 tablet (50 mg total) by mouth daily.    Dispense:  30 tablet    Refill:  3      No chief complaint on file.    History of Present Illness:    Billy Cummings is a 49 y.o. male.  Patient has past medical history of coronary artery disease.  He denies any problems at this time and takes care of activities of daily living.  He has history of chronic smoking and continues to smoke unfortunately.  He has history of hypertriglyceridemia and essential hypertension.  His blood pressure is elevated.  He denies any problems at this time and takes care of activities of daily living.  Unfortunately is not very compliant with diet and exercise.  And he understands the risks.  Past Medical History:  Diagnosis Date  . Coronary artery disease involving native coronary artery of native heart without angina pectoris 08/29/2015   Overview:  STEMI -late restenosis ,inferior wall--stenting of RCA x 2 08/15/15 and old PCI and stent of RCA in 2012  . Dyslipidemia 08/29/2015    Past Surgical History:  Procedure Laterality Date  . CORONARY ANGIOPLASTY WITH STENT PLACEMENT Left 08/15/2015    Current Medications: Current Meds  Medication Sig  . amoxicillin-clavulanate (AUGMENTIN) 875-125 MG tablet Take 1 tablet by mouth 2 (two) times daily.  Marland Kitchen aspirin EC 81 MG tablet Take 1 tablet (81 mg total) by mouth daily.  . citalopram (CELEXA) 20 MG tablet Take 30 mg by mouth daily.  . Coenzyme Q-10 200 MG CAPS Take 200 mg by mouth.  . DOCOSAHEXAENOIC ACID PO  Take by mouth.  . famotidine (PEPCID) 20 MG tablet Take 20 mg by mouth daily.  . fenofibrate 160 MG tablet Take 160 mg by mouth daily.  . fluticasone (FLONASE) 50 MCG/ACT nasal spray Place 1 spray into both nostrils daily.  . hydrochlorothiazide (HYDRODIURIL) 12.5 MG tablet Take 12.5 mg by mouth every morning.  [name] 1 g CAPS Take 2 capsules by mouth 2 (two) times daily.  Marland Kitchen levocetirizine (XYZAL) 5 MG tablet Take 5 mg by mouth at bedtime.  Marland Kitchen omega-3 acid ethyl esters (LOVAZA) 1 g capsule Take 2 capsules by mouth 2 (two) times  daily.  . pantoprazole (PROTONIX) 40 MG tablet Take 40 mg by mouth 2 (two) times daily.  . rosuvastatin (CRESTOR) 40 MG tablet Take 40 mg by mouth daily.  . Vitamin D, Ergocalciferol, (DRISDOL) 1.25 MG (50000 UNIT) CAPS capsule Take 50,000 Units by mouth once a week.     Allergies:   Patient has no known allergies.   Social History   Socioeconomic History  . Marital status: Single    Spouse name: Not on file  . Number of children: Not on file  . Years of education: Not on file  . Highest education level: Not on file  Occupational History  . Not on file  Tobacco Use  . Smoking status: Current Every Day Smoker  . Smokeless tobacco: Never Used  Substance and Sexual Activity  . Alcohol use: No    Alcohol/week: 0.0 standard drinks  . Drug use: No  . Sexual activity: Not on file  Other Topics Concern  . Not on file  Social History Narrative  . Not on file   Social Determinants of Health   Financial Resource Strain:   . Difficulty of Paying Living Expenses: Not on file  Food Insecurity:   . Worried About Charity fundraiser in the Last Year: Not on file  . Ran Out of Food in the Last Year: Not on file  Transportation Needs:   . Lack of Transportation (Medical): Not on file  . Lack of Transportation (Non-Medical): Not on file  Physical Activity:   . Days of Exercise per Week: Not on file  . Minutes of Exercise per Session: Not on file  Stress:   . Feeling of Stress : Not on file  Social Connections:   . Frequency of Communication with Friends and Family: Not on file  . Frequency of Social Gatherings with Friends and Family: Not on file  . Attends Religious Services: Not on file  . Active Member of Clubs or Organizations: Not on file  . Attends Archivist Meetings: Not on file  . Marital Status: Not on file     Family History: The patient's family history includes Heart attack in his brother, father, and mother; Heart disease in his brother, father, and  mother.  ROS:   Please see the history of present illness.    All other systems reviewed and are negative.  EKGs/Labs/Other Studies Reviewed:    The following studies were reviewed today: EKG reveals sinus rhythm and nonspecific ST-T changes.   Recent Labs: No results found for requested labs within last 8760 hours.  Recent Lipid Panel No results found for: CHOL, TRIG, HDL, CHOLHDL, VLDL, LDLCALC, LDLDIRECT  Physical Exam:    VS:  BP (!) 157/97   Pulse 70   Ht 6\' 2"  (1.88 m)   Wt 243 lb 3.2 oz (110.3 kg)   SpO2 97%   BMI 31.23  kg/m     Wt Readings from Last 3 Encounters:  02/23/20 243 lb 3.2 oz (110.3 kg)  05/04/19 234 lb (106.1 kg)  05/11/17 220 lb (99.8 kg)     GEN: Patient is in no acute distress HEENT: Normal NECK: No JVD; No carotid bruits LYMPHATICS: No lymphadenopathy CARDIAC: Hear sounds regular, 2/6 systolic murmur at the apex. RESPIRATORY:  Clear to auscultation without rales, wheezing or rhonchi  ABDOMEN: Soft, non-tender, non-distended MUSCULOSKELETAL:  No edema; No deformity  SKIN: Warm and dry NEUROLOGIC:  Alert and oriented x 3 PSYCHIATRIC:  Normal affect   Signed, Billy Lindau, MD  02/23/2020 4:56 PM    [facility]

## 2020-02-23 NOTE — Patient Instructions (Signed)
Medication Instructions:  Your physician has recommended you make the following change in your medication:   Take Cozaar 50 mg daily.  *If you need a refill on your cardiac medications before your next appointment, please call your pharmacy*   Lab Work: Your physician recommends that you return for lab work in: 1 week. You need to have labs done when you are fasting.  You can come Monday through Friday 8:30 am to 12:00 pm and 1:15 to 4:30. You do not need to make an appointment as the order has already been placed. The labs you are going to have done are BMET, CBC, TSH, LFT and Lipids.   If you have labs (blood work) drawn today and your tests are completely normal, you will receive your results only by: Marland Kitchen MyChart Message (if you have MyChart) OR . A paper copy in the mail If you have any lab test that is abnormal or we need to change your treatment, we will call you to review the results.   Testing/Procedures: None ordered   Follow-Up: At [facility], you and your health needs are our priority.  As part of our continuing mission to provide you with exceptional heart care, we have created designated Provider Care Teams.  These Care Teams include your primary Cardiologist (physician) and Advanced Practice Providers (APPs -  Physician Assistants and Nurse Practitioners) who all work together to provide you with the care you need, when you need it.  We recommend signing up for the patient portal called "MyChart".  Sign up information is provided on this After Visit Summary.  MyChart is used to connect with patients for Virtual Visits (Telemedicine).  Patients are able to view lab/test results, encounter notes, upcoming appointments, etc.  Non-urgent messages can be sent to your provider as well.   To learn more about what you can do with MyChart, go to [email or web address].    Your next appointment:   3 month(s)  The format for your next appointment:   In Person  Provider:     Jyl Heinz, MD   Other Instructions  Blood Pressure Record Sheet To take your blood pressure, you will need a blood pressure machine. You can buy a blood pressure machine (blood pressure monitor) at your clinic, drug store, or online. When choosing one, consider:  An automatic monitor that has an arm cuff.  A cuff that wraps snugly around your upper arm. You should be able to fit only one finger between your arm and the cuff.  A device that stores blood pressure reading results.  Do not choose a monitor that measures your blood pressure from your wrist or finger. Follow your health care provider's instructions for how to take your blood pressure. To use this form:  Get one reading in the morning (a.m.) 1-2 hours after your medication you take any medicines.  Get one reading in the evening (p.m.) before supper.  Take at least 2 readings with each blood pressure check. This makes sure the results are correct. Wait 1-2 minutes between measurements.  Write down the results in the spaces on this form.  Repeat this once a week, or as told by your health care provider.  Make a follow-up appointment with your health care provider to discuss the results. Blood pressure log Date: _______________________  a.m. _____________________(1st reading) _____________________(2nd reading)  p.m. _____________________(1st reading) _____________________(2nd reading) Date: _______________________  a.m. _____________________(1st reading) _____________________(2nd reading)  p.m. _____________________(1st reading) _____________________(2nd reading) Date: _______________________  a.m. _____________________(1st reading) _____________________(2nd reading)  p.m. _____________________(1st reading) _____________________(2nd reading) Date: _______________________  a.m. _____________________(1st reading) _____________________(2nd reading)  p.m. _____________________(1st reading)  _____________________(2nd reading) Date: _______________________  a.m. _____________________(1st reading) _____________________(2nd reading)  p.m. _____________________(1st reading) _____________________(2nd reading) This information is not intended to replace advice given to you by your health care provider. Make sure you discuss any questions you have with your health care provider. Document Revised: 06/05/2017 Document Reviewed: 04/07/2017 Elsevier Patient Education  [employer].  Losartan Tablets What is this medicine? LOSARTAN (loe SAR tan) is an angiotensin II receptor blocker, also known as an ARB. It treats high blood pressure. It can slow kidney damage in some patients. It may also be used to lower the risk of stroke. This medicine may be used for other purposes; ask your health care provider or pharmacist if you have questions. COMMON BRAND NAME(S): Cozaar What should I tell my health care provider before I take this medicine? They need to know if you have any of these conditions:  heart failure  kidney or liver disease  an unusual or allergic reaction to losartan, other medicines, foods, dyes, or preservatives  pregnant or trying to get pregnant  breast-feeding How should I use this medicine? Take this drug by mouth. Take it as directed on the prescription label at the same time every day. You can take it with or without food. If it upsets your stomach, take it with food. Keep taking it unless your health care provider tells you to stop. Talk to your health care provider about the use of this drug in children. While it may be prescribed for children as young as 6 for selected conditions, precautions do apply. Overdosage: If you think you have taken too much of this medicine contact a poison control center or emergency room at once. NOTE: This medicine is only for you. Do not share this medicine with others. What if I miss a dose? If you miss a dose, take it as soon  as you can. If it is almost time for your next dose, take only that dose. Do not take double or extra doses. What may interact with this medicine?  blood pressure medicines  diuretics, especially triamterene, spironolactone, or amiloride  fluconazole  NSAIDs, medicines for pain and inflammation, like ibuprofen or naproxen  potassium salts or potassium supplements  rifampin This list may not describe all possible interactions. Give your health care provider a list of all the medicines, herbs, non-prescription drugs, or dietary supplements you use. Also tell them if you smoke, drink alcohol, or use illegal drugs. Some items may interact with your medicine. What should I watch for while using this medicine? Visit your doctor or health care professional for regular checks on your progress. Check your blood pressure as directed. Ask your doctor or health care professional what your blood pressure should be and when you should contact him or her. Call your doctor or health care professional if you notice an irregular or fast heart beat. Women should inform their doctor if they wish to become pregnant or think they might be pregnant. There is a potential for serious side effects to an unborn child, particularly in the second or third trimester. Talk to your health care professional or pharmacist for more information. You may get drowsy or dizzy. Do not drive, use machinery, or do anything that needs mental alertness until you know how this drug affects you. Do not stand or sit up quickly, especially if you are an older patient. This reduces the risk  of dizzy or fainting spells. Alcohol can make you more drowsy and dizzy. Avoid alcoholic drinks. Avoid salt substitutes unless you are told otherwise by your doctor or health care professional. Do not treat yourself for coughs, colds, or pain while you are taking this medicine without asking your doctor or health care professional for advice. Some  ingredients may increase your blood pressure. What side effects may I notice from receiving this medicine? Side effects that you should report to your doctor or health care professional as soon as possible:  confusion, dizziness, light headedness or fainting spells  decreased amount of urine passed  difficulty breathing or swallowing, hoarseness, or tightening of the throat  fast or irregular heart beat, palpitations, or chest pain  skin rash, itching  swelling of your face, lips, tongue, hands, or feet Side effects that usually do not require medical attention (report to your doctor or health care professional if they continue or are bothersome):  cough  decreased sexual function or desire  headache  nasal congestion or stuffiness  nausea or stomach pain  sore or cramping muscles This list may not describe all possible side effects. Call your doctor for medical advice about side effects. You may report side effects to FDA at 1-800-FDA-1088. Where should I keep my medicine? Keep out of the reach of children and pets. Store at room temperature between 15 and 30 degrees C (59 and 86 degrees F). Protect from light. Keep the container tightly closed. Throw away any unused drug after the expiration date. NOTE: This sheet is a summary. It may not cover all possible information. If you have questions about this medicine, talk to your doctor, pharmacist, or health care provider.  2020 Elsevier/Gold Standard (2018-11-10 12:12:28)

## 2020-05-30 ENCOUNTER — Ambulatory Visit: Payer: No Typology Code available for payment source | Admitting: Cardiology

## 2020-06-04 ENCOUNTER — Encounter: Payer: Self-pay | Admitting: Pulmonary Disease

## 2020-06-28 ENCOUNTER — Ambulatory Visit (INDEPENDENT_AMBULATORY_CARE_PROVIDER_SITE_OTHER): Payer: No Typology Code available for payment source | Admitting: Pulmonary Disease

## 2020-06-28 ENCOUNTER — Other Ambulatory Visit: Payer: Self-pay

## 2020-06-28 ENCOUNTER — Encounter: Payer: Self-pay | Admitting: Pulmonary Disease

## 2020-06-28 VITALS — BP 138/80 | HR 73 | Temp 97.2°F | Ht 74.0 in | Wt 242.0 lb

## 2020-06-28 DIAGNOSIS — G4733 Obstructive sleep apnea (adult) (pediatric): Secondary | ICD-10-CM

## 2020-06-28 DIAGNOSIS — F17210 Nicotine dependence, cigarettes, uncomplicated: Secondary | ICD-10-CM

## 2020-06-28 HISTORY — DX: Obstructive sleep apnea (adult) (pediatric): G47.33

## 2020-06-28 NOTE — Patient Instructions (Signed)
  Rx for autoCPAP 5-15 cm with nasal mask Start on nicotine patch 14/day & cut down on smoking

## 2020-06-28 NOTE — Progress Notes (Signed)
Subjective:    Patient ID: Billy Cummings, male    DOB: May 06, 1970, 50 y.o.   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 He works for [employer].  He smokes about 1 pack/day.  He used to be a heavy drinker but has cut down significantly PMH-MI 2017 & 2012 , hyperlipidemia, restless leg syndrome on Mirapex  He reports dyspnea on unusual exertion.  He denies frequent chest colds.  He has gained 40 pounds over the last 2 years.  He denies daily cough or wheezing.  He was given Flovent HFA inhaler but he has not opened the box yet.  Epworth sleepiness score is 18 and he reports sleepiness while sitting and reading, watching TV, as a passenger in a car.  He is to train other heavy equipment operator's and if he sits still for prolonged time he will feel very sleepy.  He is a Financial trader.  Bedtime is between 9 and 10 PM, sleep latency is minimal, he sleeps on his left side with 1 pillow, reports 3-4 nocturnal awakenings including nocturia and is out of bed by 5 AM feeling tired with dryness of mouth but denies headaches. Girlfriend has noted loud snoring. He often moves his legs around in bed, this is worse in the evenings and he has an irresistible urge to move his legs There is no history suggestive of cataplexy, sleep paralysis or parasomnias  Reviewed PCP notes  Significant tests/ events reviewed NPSG 10/2019 severe OSA, AHI 41/hour, supine AHI 71/hour, lowest desaturation 83%-210 pounds  Spirometry ratio 78, FEV1 72%, FVC 72%  Past Medical History:  Diagnosis Date  . Cigarette smoker 08/29/2015  . Coronary artery disease involving native coronary artery of native heart without angina pectoris 08/29/2015   Overview:  STEMI -late restenosis ,inferior wall--stenting of RCA x 2 08/15/15 and old PCI and stent of RCA in 2012  . Dyslipidemia 08/29/2015  . Essential hypertension 02/23/2020  .  Hypertriglyceridemia 05/11/2017                                                     Past Surgical History:  Procedure Laterality Date  . CORONARY ANGIOPLASTY WITH STENT PLACEMENT Left 08/15/2015    No Known Allergies  Social History   Socioeconomic History  . Marital status: Single    Spouse name: Not on file  . Number of children: Not on file  . Years of education: Not on file  . Highest education level: Not on file  Occupational History  . Not on file  Tobacco Use  . Smoking status: Current Every Day Smoker    Packs/day: 2.00    Years: 35.00    Pack years: 70.00    Types: Cigarettes  . Smokeless tobacco: Never Used  . Tobacco comment: smokes 1 pack per day 06/28/2020  Vaping Use  . Vaping Use: Never used  Substance and Sexual Activity  . Alcohol use: No    Alcohol/week: 0.0 standard drinks  . Drug use: No  . Sexual activity: Not on file  Other Topics Concern  . Not on file  Social History Narrative  . Not on file   Social Determinants of Health   Financial Resource Strain: Not on file  Food Insecurity: Not on file  Transportation Needs: Not on file  Physical Activity: Not on file  Stress: Not on file  Social Connections: Not on file  Intimate Partner Violence: Not on file    Family History  Problem Relation Age of Onset  . Heart attack Mother   . Heart disease Mother   . Heart attack Father   . Heart disease Father   . Heart attack Brother   . Heart disease Brother       Review of Systems Shortness of breath with activity Nonproductive cough Acid heartburn, Weight gain Sneezing    Objective:   Physical Exam  Gen. Pleasant, obese, in no distress, normal affect ENT - no pallor,icterus, no post nasal drip, class 2 airway, mild overbite, long uvula Neck: No JVD, no thyromegaly, no carotid bruits Lungs: no use of accessory muscles, no dullness to percussion, decreased without rales or rhonchi  Cardiovascular: Rhythm regular, heart sounds   normal, no murmurs or gallops, no peripheral edema Abdomen: soft and non-tender, no hepatosplenomegaly, BS normal. Musculoskeletal: No deformities, no cyanosis or clubbing Neuro:  alert, non focal, no tremors        Assessment & Plan:

## 2020-06-28 NOTE — Assessment & Plan Note (Signed)
He does not have significant COPD on spirometry at this time, I have requested him to focus on smoking cessation rather than medications for breathing.  I advised him to get started on nicotine patch and set a quit date. We explained cardiovascular and pulmonary effects of smoking

## 2020-06-28 NOTE — Assessment & Plan Note (Signed)
He has severe OSA.  This will not be corrected by an oral appliance.  He is a heavy Company secretary.  We will write prescription for auto CPAP 5 to 15 cm with a nasal mask.  Hopefully he will adjust to this and this will help his somnolence significantly.  We will follow-up with him in 2 to 3 months and tweak settings as needed.  If he does not tolerate then we will arrange for formal titration study.  Weight loss encouraged, compliance with goal of at least 4-6 hrs every night is the expectation. Advised against medications with sedative side effects Cautioned against driving when sleepy - understanding that sleepiness will vary on a day to day basis

## 2020-06-28 NOTE — Progress Notes (Signed)
Per verbal from Dr Elsworth Soho, past sleep study was in consult paperwork and Dr Elsworth Soho has it and will give it to scan into patient chart tomorrow (Friday 06/29/2020).

## 2020-07-17 DIAGNOSIS — D72829 Elevated white blood cell count, unspecified: Secondary | ICD-10-CM | POA: Insufficient documentation

## 2020-07-17 HISTORY — DX: Elevated white blood cell count, unspecified: D72.829

## 2020-07-20 ENCOUNTER — Telehealth: Payer: Self-pay | Admitting: Pulmonary Disease

## 2020-07-20 NOTE — Telephone Encounter (Signed)
Pt's OV notes from 06/28/20 have been faxed to [facility] with [facility] at provided fax number by her. Attempted to call Butch Penny but unable to reach. Left Butch Penny a detailed message letting her know that pt's OV notes were faxed. Nothing further needed.

## 2020-09-03 ENCOUNTER — Ambulatory Visit: Payer: No Typology Code available for payment source | Admitting: Pulmonary Disease

## 2020-11-08 ENCOUNTER — Telehealth: Payer: Self-pay | Admitting: Cardiology

## 2020-11-08 NOTE — Telephone Encounter (Signed)
New message:     Patient calling to do a provider switch to see Tobb.

## 2020-11-08 NOTE — Telephone Encounter (Signed)
Pt has been advised that Dr. Harriet Masson will be moving to [facility] and he is aware and request to switch to her.

## 2020-11-12 NOTE — Progress Notes (Signed)
[facility]  [address] [place],  [place]  [address] [phone]  Clinic Day:  11/16/2020  Referring physician: P[facility], [facility]*  This document serves as a record of services personally performed by Hosie Poisson, MD. It was created on their behalf by Curry,Lauren E, a trained medical scribe. The creation of this record is based on the scribe's personal observations and the provider's statements to them.  CHIEF COMPLAINT:  CC: Leukocytosis and acute lymphocytosis  Current Treatment:  Surveillance   HISTORY OF PRESENT ILLNESS:  Billy Cummings is a 50 y.o. male referred by Carrolyn Meiers, FNP-C for the evaluation and treatment of persistent leukocytosis and acute lymphocytosis.  Records back from 2012 revealed a white count of 13.2, by 2015 this increased to 21.9 but slowly decreased over the year to 11.2 in October.  In March 2022 his white count was up to 15.2 with an ALC of 5.8, and by July the white count was 12.5 with an ALC of 4.7.  The patient states that the leukocytosis has been a chronic finding for many years.  He does smoke about 1-1.5 packs per day, and has been smoking since age 54.  He quit drinking in 2017.   INTERVAL HISTORY:  Avin is a current daily smoker, and has not been motivated to quit.  He does note cough and wheezing.  He also reports occasional night sweats.  He has chronic back pain and degenerative joint disease, and has been seen by a physician in [place].  He denies recent infections.  He has a history of TIA in 2021.  He has undergone coronary stent placement after an MI in 2017.  Otherwise, he has been fairly healthy.  His  appetite is good, and he is eating well.  He denies any significant weight loss or gain.  He is currently on a diet.  He denies fever, chills or other signs of infection.  He denies nausea, vomiting, bowel issues, or abdominal pain.  He denies sore throat, cough, dyspnea, or chest pain.  REVIEW OF  SYSTEMS:  Review of Systems  Constitutional: Negative.  Negative for appetite change, chills, fatigue, fever and unexpected weight change.  HENT:  Negative.    Eyes: Negative.   Respiratory:  Positive for cough and wheezing. Negative for chest tightness, hemoptysis and shortness of breath.   Cardiovascular: Negative.  Negative for chest pain, leg swelling and palpitations.  Gastrointestinal: Negative.  Negative for abdominal distention, abdominal pain, blood in stool, constipation, diarrhea, nausea and vomiting.  Endocrine:       Night sweats  Genitourinary: Negative.  Negative for difficulty urinating, dysuria, frequency and hematuria.   Musculoskeletal:  Positive for arthralgias and back pain. Negative for flank pain, gait problem and myalgias.  Skin: Negative.   Neurological: Negative.  Negative for dizziness, extremity weakness, gait problem, headaches, light-headedness, numbness, seizures and speech difficulty.  Hematological: Negative.   Psychiatric/Behavioral: Negative.  Negative for depression and sleep disturbance. The patient is not nervous/anxious.   All other systems reviewed and are negative.   VITALS:  Blood pressure 137/85, pulse 73, temperature 98.1 F (36.7 C), temperature source Oral, resp. rate 18, height '6\' 2"'$  (1.88 m), weight 239 lb 11.2 oz (108.7 kg), SpO2 96 %.  Wt Readings from Last 3 Encounters:  11/16/20 239 lb 11.2 oz (108.7 kg)  06/28/20 242 lb (109.8 kg)  02/23/20 243 lb 3.2 oz (110.3 kg)    Body mass index is 30.78 kg/m.  Performance  status (ECOG): 1 - Symptomatic but completely ambulatory  PHYSICAL EXAM:  Physical Exam Constitutional:      General: He is not in acute distress.    Appearance: Normal appearance. He is normal weight.  HENT:     Head: Normocephalic and atraumatic.  Eyes:     General: No scleral icterus.    Extraocular Movements: Extraocular movements intact.     Conjunctiva/sclera: Conjunctivae normal.     Pupils: Pupils are equal,  round, and reactive to light.  Neck:     Comments: 2-3 cm firm cyst in the left posterior neck Cardiovascular:     Rate and Rhythm: Normal rate and regular rhythm.     Pulses: Normal pulses.     Heart sounds: Normal heart sounds. No murmur heard.   No friction rub. No gallop.  Pulmonary:     Effort: Pulmonary effort is normal. No respiratory distress.     Breath sounds: Normal breath sounds.  Abdominal:     General: Bowel sounds are normal. There is no distension.     Palpations: Abdomen is soft. There is no hepatomegaly, splenomegaly or mass.     Tenderness: There is no abdominal tenderness.  Musculoskeletal:        General: Normal range of motion.     Cervical back: Normal range of motion and neck supple.     Right lower leg: No edema.     Left lower leg: No edema.  Lymphadenopathy:     Cervical: No cervical adenopathy.  Skin:    General: Skin is warm and dry.     Comments: He has a dilated prominent vein of his right elbow.  He had a few moles of the anterior chest which appear benign.  Neurological:     General: No focal deficit present.     Mental Status: He is alert and oriented to person, place, and time. Mental status is at baseline.  Psychiatric:        Mood and Affect: Mood normal.        Behavior: Behavior normal.        Thought Content: Thought content normal.        Judgment: Judgment normal.    LABS:   CBC Latest Ref Rng & Units 11/16/2020  WBC - 10.2  Hemoglobin 13.5 - 17.5 17.0  Hematocrit 41 - 53 49  Platelets 150 - 399 314   CMP Latest Ref Rng & Units 11/16/2020  BUN 4 - 21 13  Creatinine 0.6 - 1.3 1.1  Sodium 137 - 147 138  Potassium 3.4 - 5.3 3.9  Chloride 99 - 108 104  CO2 13 - 22 25(A)  Calcium 8.7 - 10.7 9.2  Alkaline Phos 25 - 125 110  AST 14 - 40 44(A)  ALT 10 - 40 60(A)     No results found for: TOTALPROTELP, ALBUMINELP, A1GS, A2GS, BETS, BETA2SER, GAMS, MSPIKE, SPEI No results found for: TIBC, FERRITIN, IRONPCTSAT No results found  for: LDH  STUDIES:  No results found.    HISTORY:   Past Medical History:  Diagnosis Date   Anxiety    Arthritis    Cigarette smoker 08/29/2015   Coronary artery disease involving native coronary artery of native heart without angina pectoris 08/29/2015   Overview:  STEMI -late restenosis ,inferior wall--stenting of RCA x 2 08/15/15 and old PCI and stent of RCA in 2012   Depression    Dyslipidemia 08/29/2015   Essential hypertension 02/23/2020   History of myocardial infarction  History of TIA (transient ischemic attack) 2021   Hypertriglyceridemia 05/11/2017   Restless leg syndrome    Sleep apnea     Past Surgical History:  Procedure Laterality Date   CORONARY ANGIOPLASTY WITH STENT PLACEMENT Left 08/15/2015    Family History  Problem Relation Age of Onset   Heart attack Mother    Heart disease Mother    Breast cancer Mother    Heart attack Father    Heart disease Father    Heart attack Brother    Heart disease Brother    Bladder Cancer Brother     Social History:  reports that he has been smoking cigarettes. He has a 57.00 pack-year smoking history. He has never used smokeless tobacco. He reports that he does not drink alcohol and does not use drugs.The patient is alone today.  He is divorced and lives at home alone.  He has 1 son.  He works as a Tourist information centre manager, and has never been exposed to chemicals or other toxic agents.  Allergies: No Known Allergies  Current Medications: Current Outpatient Medications  Medication Sig Dispense Refill   albuterol (VENTOLIN HFA) 108 (90 Base) MCG/ACT inhaler Inhale 1 puff into the lungs every 4 (four) hours as needed for wheezing or shortness of breath.     citalopram (CELEXA) 20 MG tablet Take 30 mg by mouth daily.     diclofenac Sodium (VOLTAREN) 1 % GEL Apply topically.     doxycycline (VIBRAMYCIN) 100 MG capsule Take 100 mg by mouth every 12 (twelve) hours.     ezetimibe (ZETIA) 10 MG tablet Take 10 mg by mouth daily.      famotidine (PEPCID) 20 MG tablet Take 20 mg by mouth daily.     fenofibrate 160 MG tablet Take 160 mg by mouth at bedtime.     fluticasone (FLOVENT HFA) 44 MCG/ACT inhaler Inhale 2 puffs into the lungs 2 (two) times daily.     folic acid (FOLVITE) 1 MG tablet Take 1 mg by mouth daily.     hydrochlorothiazide (HYDRODIURIL) 12.5 MG tablet Take 12.5 mg by mouth every morning.     levocetirizine (XYZAL) 5 MG tablet Take 5 mg by mouth at bedtime.     meclizine (ANTIVERT) 12.5 MG tablet Take 12.5 mg by mouth 3 (three) times daily as needed for dizziness.     nitroGLYCERIN (NITROSTAT) 0.4 MG SL tablet Place 0.4 mg under the tongue every 5 (five) minutes as needed for chest pain. As directed     omega-3 acid ethyl esters (LOVAZA) 1 g capsule Take 2 capsules by mouth 2 (two) times daily.     pantoprazole (PROTONIX) 40 MG tablet Take 40 mg by mouth 2 (two) times daily.     pramipexole (MIRAPEX) 0.25 MG tablet Take 0.25 mg by mouth daily. Take 2 tablets daily 2-3 hours before bedtime     rosuvastatin (CRESTOR) 40 MG tablet Take 40 mg by mouth daily.     Vitamin D, Ergocalciferol, (DRISDOL) 1.25 MG (50000 UNIT) CAPS capsule Take 50,000 Units by mouth once a week.     aspirin EC 81 MG tablet Take 1 tablet (81 mg total) by mouth daily. 90 tablet 3   atorvastatin (LIPITOR) 80 MG tablet Take 1 tablet (80 mg total) by mouth daily at 6 PM. 90 tablet 1   No current facility-administered medications for this visit.     ASSESSMENT & PLAN:   Assessment:   Persistent leukocytosis and now lymphocytosis, dating back to at least  2012.  His counts have fluctuated up and down over the past 10 years, and today they are in a normal range.  Likely this is related to his tobacco abuse.  Flow cytometry for CLL will be checked for completeness in view of his lymphcytosis, and I will also view a peripheral blood smear.     2.  Tobacco abuse.  He is not currently motivated to quit.  This is likely contributing to his  leukocytosis.  The importance of smoking cessation was reviewed today.  3.  Persistent polycythemia.  JAK-2 mutation will be checked, but this is most likely related to his tobacco abuse as well.  Plan: This is a pleasant 50 year old male who has has chronic leukocytosis and occasional lymphocytosis for many years, at least since 2012.  He is a current daily smoker which could contribute to his elevated white count.  Today, his counts are in the normal range.  For completeness flow cytometry for CLL in view of his lymphocytosis.  However, I am not highly suspicious as his levels have fluctuated up and down over the past 10 years.  I will also check a peripheral blood smear.  Because of his persistent polycythemia, I will also check for JAK-2 mutation, but most likely this is secondary polycythemia from his smoking.  Once we receive the results, I will contact him.  If no other etiology is found, then his care may be turned back over to his primary care, but I would be glad to see him back if needed.  He understands and agrees to this plan of care.  I have answered his questions and he knows to call with any concerns.  Thank you for the opportunity to participate in the care of your patients.  ADDENDUM: His peripheral blood smear is mainly remarkable for increased lymphocytes, occasionally atypical and occasional smudge cells.   I provided 40 minutes of face-to-face time during this this encounter and > 50% was spent counseling as documented under my assessment and plan.    Derwood Kaplan, MD [facility] AT [facility] [address] [place] [address] Dept: [phone] Dept Fax: [phone]   I, Rita Ohara, am acting as scribe for Derwood Kaplan, MD  I have reviewed this report as typed by the medical scribe, and it is complete and accurate.

## 2020-11-15 ENCOUNTER — Telehealth: Payer: Self-pay | Admitting: Oncology

## 2020-11-15 NOTE — Telephone Encounter (Signed)
Patient referred by Thalia Party, NP for Elevated WBC.  Appt made for 11/16/20 Labs 1:00 pm - Consult 1:30 pm

## 2020-11-16 ENCOUNTER — Inpatient Hospital Stay (INDEPENDENT_AMBULATORY_CARE_PROVIDER_SITE_OTHER): Payer: No Typology Code available for payment source | Admitting: Oncology

## 2020-11-16 ENCOUNTER — Other Ambulatory Visit: Payer: Self-pay

## 2020-11-16 ENCOUNTER — Other Ambulatory Visit: Payer: Self-pay | Admitting: Oncology

## 2020-11-16 ENCOUNTER — Encounter: Payer: Self-pay | Admitting: Oncology

## 2020-11-16 ENCOUNTER — Inpatient Hospital Stay: Payer: No Typology Code available for payment source | Attending: Oncology

## 2020-11-16 VITALS — BP 137/85 | HR 73 | Temp 98.1°F | Resp 18 | Ht 74.0 in | Wt 239.7 lb

## 2020-11-16 DIAGNOSIS — D72820 Lymphocytosis (symptomatic): Secondary | ICD-10-CM

## 2020-11-16 DIAGNOSIS — Z79899 Other long term (current) drug therapy: Secondary | ICD-10-CM | POA: Diagnosis not present

## 2020-11-16 DIAGNOSIS — C9110 Chronic lymphocytic leukemia of B-cell type not having achieved remission: Secondary | ICD-10-CM | POA: Insufficient documentation

## 2020-11-16 DIAGNOSIS — D751 Secondary polycythemia: Secondary | ICD-10-CM | POA: Diagnosis not present

## 2020-11-16 LAB — HEPATIC FUNCTION PANEL
ALT: 60 — AB (ref 10–40)
AST: 44 — AB (ref 14–40)
Alkaline Phosphatase: 110 (ref 25–125)
Bilirubin, Total: 0.6

## 2020-11-16 LAB — BASIC METABOLIC PANEL
BUN: 13 (ref 4–21)
CO2: 25 — AB (ref 13–22)
Chloride: 104 (ref 99–108)
Creatinine: 1.1 (ref 0.6–1.3)
Glucose: 116
Potassium: 3.9 (ref 3.4–5.3)
Sodium: 138 (ref 137–147)

## 2020-11-16 LAB — CBC AND DIFFERENTIAL
HCT: 49 (ref 41–53)
Hemoglobin: 17 (ref 13.5–17.5)
Neutrophils Absolute: 4.79
Platelets: 314 (ref 150–399)
WBC: 10.2

## 2020-11-16 LAB — COMPREHENSIVE METABOLIC PANEL
Albumin: 4.3 (ref 3.5–5.0)
Calcium: 9.2 (ref 8.7–10.7)

## 2020-11-16 LAB — LACTATE DEHYDROGENASE: LDH: 144 U/L (ref 98–192)

## 2020-11-16 LAB — CBC: RBC: 5.63 — AB (ref 3.87–5.11)

## 2020-11-16 NOTE — Progress Notes (Unsigned)
Spoke with Apolonio Schneiders @ [facility] [phone]; no PA required for CPT 81270 JAK 2 REF#Rachel U 11/16/2020 8:46 AM

## 2020-11-21 NOTE — Telephone Encounter (Signed)
11/21/2020 LVM for pt to call and schedule with Tobb/kbl

## 2020-11-23 ENCOUNTER — Inpatient Hospital Stay: Payer: No Typology Code available for payment source | Attending: Oncology

## 2020-11-23 ENCOUNTER — Other Ambulatory Visit: Payer: Self-pay

## 2020-11-23 ENCOUNTER — Other Ambulatory Visit: Payer: Self-pay | Admitting: Oncology

## 2020-11-23 DIAGNOSIS — D72820 Lymphocytosis (symptomatic): Secondary | ICD-10-CM

## 2020-11-23 DIAGNOSIS — Z79899 Other long term (current) drug therapy: Secondary | ICD-10-CM | POA: Insufficient documentation

## 2020-11-23 DIAGNOSIS — C9110 Chronic lymphocytic leukemia of B-cell type not having achieved remission: Secondary | ICD-10-CM | POA: Insufficient documentation

## 2020-11-23 LAB — JAK2 EXONS 12-15

## 2020-11-28 LAB — SURGICAL PATHOLOGY

## 2020-11-29 ENCOUNTER — Telehealth: Payer: Self-pay

## 2020-11-29 NOTE — Telephone Encounter (Signed)
Faxed reports to [facility]

## 2020-11-29 NOTE — Telephone Encounter (Signed)
Patient notified

## 2020-11-29 NOTE — Telephone Encounter (Signed)
-----   Message from Derwood Kaplan, MD sent at 11/28/2020  7:30 PM EDT ----- Regarding: call pt Tell him tests for leukemia or polycythemia are neg so I won't need to see him unless change in his blood.   Send copy of JAK 2 and Flow cytometry to his PCP

## 2020-12-25 ENCOUNTER — Telehealth: Payer: Self-pay | Admitting: Cardiology

## 2020-12-25 NOTE — Telephone Encounter (Signed)
Records sent via Epic.

## 2020-12-25 NOTE — Telephone Encounter (Signed)
[facility] (Patient's PCP) was requesting records and office visit notes for the patient. Please fax them to [phone] Attn: [facility]

## 2021-01-11 ENCOUNTER — Other Ambulatory Visit: Payer: Self-pay

## 2021-01-11 DIAGNOSIS — G2581 Restless legs syndrome: Secondary | ICD-10-CM | POA: Insufficient documentation

## 2021-01-11 DIAGNOSIS — M1990 Unspecified osteoarthritis, unspecified site: Secondary | ICD-10-CM | POA: Insufficient documentation

## 2021-01-11 DIAGNOSIS — F32A Depression, unspecified: Secondary | ICD-10-CM | POA: Insufficient documentation

## 2021-01-11 DIAGNOSIS — F419 Anxiety disorder, unspecified: Secondary | ICD-10-CM | POA: Insufficient documentation

## 2021-01-11 DIAGNOSIS — I252 Old myocardial infarction: Secondary | ICD-10-CM | POA: Insufficient documentation

## 2021-01-15 ENCOUNTER — Ambulatory Visit (INDEPENDENT_AMBULATORY_CARE_PROVIDER_SITE_OTHER): Payer: No Typology Code available for payment source | Admitting: Cardiology

## 2021-01-15 ENCOUNTER — Encounter: Payer: Self-pay | Admitting: Cardiology

## 2021-01-15 ENCOUNTER — Telehealth: Payer: Self-pay

## 2021-01-15 ENCOUNTER — Other Ambulatory Visit: Payer: Self-pay

## 2021-01-15 VITALS — BP 154/100 | HR 73 | Ht 74.0 in | Wt 245.0 lb

## 2021-01-15 DIAGNOSIS — I10 Essential (primary) hypertension: Secondary | ICD-10-CM | POA: Insufficient documentation

## 2021-01-15 DIAGNOSIS — F17210 Nicotine dependence, cigarettes, uncomplicated: Secondary | ICD-10-CM

## 2021-01-15 DIAGNOSIS — E785 Hyperlipidemia, unspecified: Secondary | ICD-10-CM | POA: Diagnosis not present

## 2021-01-15 DIAGNOSIS — I25118 Atherosclerotic heart disease of native coronary artery with other forms of angina pectoris: Secondary | ICD-10-CM | POA: Diagnosis not present

## 2021-01-15 DIAGNOSIS — E669 Obesity, unspecified: Secondary | ICD-10-CM

## 2021-01-15 DIAGNOSIS — R0600 Dyspnea, unspecified: Secondary | ICD-10-CM

## 2021-01-15 DIAGNOSIS — R0609 Other forms of dyspnea: Secondary | ICD-10-CM

## 2021-01-15 MED ORDER — ISOSORBIDE MONONITRATE ER 30 MG PO TB24: 30.0000 mg | ORAL_TABLET | Freq: Every day | ORAL | 3 refills | Status: DC

## 2021-01-15 MED ORDER — VASCEPA 0.5 G PO CAPS: 500.0000 mg | ORAL_CAPSULE | Freq: Every day | ORAL | 3 refills | Status: DC

## 2021-01-15 NOTE — Patient Instructions (Addendum)
Medication Instructions:  Your physician has recommended you make the following change in your medication:  STOP: Lipitor START: Vascepa 500 mg (0.5 grams) twice daily START: Imdur 30 mg once daily *If you need a refill on your cardiac medications before your next appointment, please call your pharmacy*   Lab Work: Your physician recommends that you return for lab work in:  Tomorrow: BMET, Mag, CBC If you have labs (blood work) drawn today and your tests are completely normal, you will receive your results only by: [employer] (if you have MyChart) OR A paper copy in the mail If you have any lab test that is abnormal or we need to change your treatment, we will call you to review the results.   Testing/Procedures:  [facility] AT [place] [address] Dept: [phone] Loc: [place]  01/15/2021  You are scheduled for a Cardiac Catheterization on Wednesday, October 5 with Dr. Sherren Mocha.  1. Please arrive at the [facility] (Main Entrance A) at [facility]: [address] [place], [place] [address] at 6:30 AM (This time is two hours before your procedure to ensure your preparation). Free valet parking service is available.   Special note: Every effort is made to have your procedure done on time. Please understand that emergencies sometimes delay scheduled procedures.  2. Diet: Do not eat solid foods after midnight.  The patient may have clear liquids until 5am upon the day of the procedure.  3. Labs: You will need to have blood drawn on TOMORROW  4. Medication instructions in preparation for your procedure:   Contrast Allergy: No  On the morning of your procedure, take your Aspirin and any morning medicines NOT listed above.  You may use sips of water.  5. Plan for one night stay--bring personal belongings. 6. Bring a current list of your medications  and current insurance cards. 7. You MUST have a responsible person to drive you home. 8. Someone MUST be with you the first 24 hours after you arrive home or your discharge will be delayed. 9. Please wear clothes that are easy to get on and off and wear slip-on shoes.  Thank you for allowing [place] to care for you!   -- [facility]    Follow-Up: At [facility], you and your health needs are our priority.  As part of our continuing mission to provide you with exceptional heart care, we have created designated Provider Care Teams.  These Care Teams include your primary Cardiologist (physician) and Advanced Practice Providers (APPs -  Physician Assistants and Nurse Practitioners) who all work together to provide you with the care you need, when you need it.  We recommend signing up for the patient portal called "MyChart".  Sign up information is provided on this After Visit Summary.  MyChart is used to connect with patients for Virtual Visits (Telemedicine).  Patients are able to view lab/test results, encounter notes, upcoming appointments, etc.  Non-urgent messages can be sent to your provider as well.   To learn more about what you can do with MyChart, go to [email or web address].    Your next appointment:   4 weeks  The format for your next appointment:   In Person  Provider:   Berniece Salines, DO [address], [place], [place] [address]    Other Instructions

## 2021-01-15 NOTE — H&P (View-Only) (Signed)
Cardiology Office Note:    Date:  01/15/2021   ID:  Billy Cummings, DOB 02/02/1971, MRN [id number]  PCP:  Pc, [facility]  Cardiologist:  Berniece Salines, DO  Electrophysiologist:  None   Referring MD: Pc, [facility]*   Chief Complaint  Patient presents with   Chest Pain   Shortness of Breath   Leg Swelling   restless leg    History of Present Illness:    Billy Cummings is a 50 y.o. male with a hx of coronary artery disease status post stent to the RCA x2 in 2017 and then previous stent to the RCA in 2012, dyslipidemia, hypertriglyceridemia, hypertension, current smoker.  The patient previously followed with Dr. Geraldo Pitter and was last seen by him March 24, 2020.  He tells me today that he is concerned because recently he has been experiencing intermittent chest discomfort.  He describes it as a midsternal pressure-like and sometimes burning sensation.  It starts mid chest sometimes it would spread diffusely across his chest but most times it goes up to his neck and once is there he feels as if his neck is experiencing a spasm.  When this occurred he has associated shortness of breath.  Recently the pain has gotten frequent in episodes and he has started using nitroglycerin.  He is concerned about this.    He also noticed recently he has had some increasing leg edema.  He admits to isolated shortness of breath outside of the chest pain as well per  Past Medical History:  Diagnosis Date   Anxiety    Arthritis    Cigarette smoker 08/29/2015   Coronary artery disease involving native coronary artery of native heart without angina pectoris 08/29/2015   Overview:  STEMI -late restenosis ,inferior wall--stenting of RCA x 2 08/15/15 and old PCI and stent of RCA in 2012   Depression    Dyslipidemia 08/29/2015   Essential hypertension 02/23/2020   History of myocardial infarction    History of TIA (transient ischemic attack) 2021   Hypertriglyceridemia 05/11/2017    Leukocytosis 07/17/2020   OSA (obstructive sleep apnea) 06/28/2020   Severe, AHI 40/hour-210 pounds   Restless leg syndrome     Past Surgical History:  Procedure Laterality Date   CORONARY ANGIOPLASTY WITH STENT PLACEMENT Left 08/15/2015    Current Medications: Current Meds  Medication Sig   albuterol (VENTOLIN HFA) 108 (90 Base) MCG/ACT inhaler Inhale 1 puff into the lungs every 4 (four) hours as needed for wheezing or shortness of breath.   aspirin EC 81 MG tablet Take 1 tablet (81 mg total) by mouth daily.   busPIRone (BUSPAR) 5 MG tablet Take 5 mg by mouth 2 (two) times daily as needed for anxiety.   citalopram (CELEXA) 20 MG tablet Take 30 mg by mouth daily.   diclofenac Sodium (VOLTAREN) 1 % GEL Apply 1 application topically daily.   doxycycline (VIBRAMYCIN) 100 MG capsule Take 100 mg by mouth every 12 (twelve) hours.   ezetimibe (ZETIA) 10 MG tablet Take 10 mg by mouth daily.   famotidine (PEPCID) 20 MG tablet Take 20 mg by mouth daily.   fenofibrate 160 MG tablet Take 160 mg by mouth at bedtime.   fluticasone (FLOVENT HFA) 44 MCG/ACT inhaler Inhale 2 puffs into the lungs 2 (two) times daily.   folic acid (FOLVITE) 1 MG tablet Take 1 mg by mouth daily.   hydrochlorothiazide (HYDRODIURIL) 12.5 MG tablet Take 12.5 mg by mouth every morning.   hydrOXYzine (  ATARAX/VISTARIL) 50 MG tablet Take 50 mg by mouth every 6 (six) hours as needed for anxiety.   Icosapent Ethyl (VASCEPA) 0.5 g CAPS Take 500 mg by mouth daily.   isosorbide mononitrate (IMDUR) 30 MG 24 hr tablet Take 1 tablet (30 mg total) by mouth daily.   levocetirizine (XYZAL) 5 MG tablet Take 5 mg by mouth at bedtime.   meclizine (ANTIVERT) 12.5 MG tablet Take 12.5 mg by mouth 3 (three) times daily as needed for dizziness.   nitroGLYCERIN (NITROSTAT) 0.4 MG SL tablet Place 0.4 mg under the tongue every 5 (five) minutes as needed for chest pain. As directed   omega-3 acid ethyl esters (LOVAZA) 1 g capsule Take 2 capsules by  mouth 2 (two) times daily.   pantoprazole (PROTONIX) 40 MG tablet Take 40 mg by mouth 2 (two) times daily.   pramipexole (MIRAPEX) 0.25 MG tablet Take 0.25 mg by mouth daily. Take 2 tablets daily 2-3 hours before bedtime   rosuvastatin (CRESTOR) 40 MG tablet Take 40 mg by mouth daily.   Vitamin D, Ergocalciferol, (DRISDOL) 1.25 MG (50000 UNIT) CAPS capsule Take 50,000 Units by mouth once a week.   [DISCONTINUED] atorvastatin (LIPITOR) 80 MG tablet Take 80 mg by mouth daily.     Allergies:   Patient has no known allergies.   Social History   Socioeconomic History   Marital status: Divorced    Spouse name: Not on file   Number of children: Not on file   Years of education: Not on file   Highest education level: Not on file  Occupational History   Not on file  Tobacco Use   Smoking status: Every Day    Packs/day: 1.50    Years: 38.00    Pack years: 57.00    Types: Cigarettes   Smokeless tobacco: Never   Tobacco comments:    smokes 1 pack per day 06/28/2020  Vaping Use   Vaping Use: Never used  Substance and Sexual Activity   Alcohol use: No    Alcohol/week: 0.0 standard drinks   Drug use: No   Sexual activity: Not on file  Other Topics Concern   Not on file  Social History Narrative   Not on file   Social Determinants of Health   Financial Resource Strain: Not on file  Food Insecurity: Not on file  Transportation Needs: Not on file  Physical Activity: Not on file  Stress: Not on file  Social Connections: Not on file     Family History: The patient's family history includes Bladder Cancer in his brother; Breast cancer in his mother; Heart attack in his brother, father, and mother; Heart disease in his brother, father, and mother.  ROS:   Review of Systems  Constitution: Negative for decreased appetite, fever and weight gain.  HENT: Negative for congestion, ear discharge, hoarse voice and sore throat.   Eyes: Negative for discharge, redness, vision loss in right  eye and visual halos.  Cardiovascular: Reports chest pain, dyspnea on exertion, leg swelling.  Negative for, orthopnea and palpitations.  Respiratory: Negative for cough, hemoptysis, shortness of breath and snoring.   Endocrine: Negative for heat intolerance and polyphagia.  Hematologic/Lymphatic: Negative for bleeding problem. Does not bruise/bleed easily.  Skin: Negative for flushing, nail changes, rash and suspicious lesions.  Musculoskeletal: Negative for arthritis, joint pain, muscle cramps, myalgias, neck pain and stiffness.  Gastrointestinal: Negative for abdominal pain, bowel incontinence, diarrhea and excessive appetite.  Genitourinary: Negative for decreased libido, genital sores and incomplete  emptying.  Neurological: Negative for brief paralysis, focal weakness, headaches and loss of balance.  Psychiatric/Behavioral: Negative for altered mental status, depression and suicidal ideas.  Allergic/Immunologic: Negative for HIV exposure and persistent infections.    EKGs/Labs/Other Studies Reviewed:    The following studies were reviewed today:   EKG:  The ekg ordered today demonstrates sinus rhythm, heart rate 77 bpm with right-sided interventricular conduction defect and prolonged QTC, compared to EKG done in January 2021 prolonged QTC is  new  Pharmacologic nuclear stress test January 2019 Nuclear stress EF: 61%. No wall motion abnormalities. There was no ST segment deviation noted during stress. There is basal inferior perfusion defect at rest. Normal-appearing at stress. This is a low risk study. No perfusion evidence of ischemia. Myocardial thickening noted, consider left ventricular hypertrophy.   Candee Furbish, MD    Recent Labs: 11/16/2020: ALT 60; BUN 13; Creatinine 1.1; Hemoglobin 17.0; Platelets 314; Potassium 3.9; Sodium 138  Recent Lipid Panel No results found for: CHOL, TRIG, HDL, CHOLHDL, VLDL, LDLCALC, LDLDIRECT  Physical Exam:    VS:  BP (!) 154/100 (BP  Location: Left Arm, Patient Position: Sitting)   Pulse 73   Ht 6\' 2"  (1.88 m)   Wt 245 lb (111.1 kg)   SpO2 93%   BMI 31.46 kg/m     Wt Readings from Last 3 Encounters:  01/15/21 245 lb (111.1 kg)  11/16/20 239 lb 11.2 oz (108.7 kg)  06/28/20 242 lb (109.8 kg)     GEN: Well nourished, well developed in no acute distress HEENT: Normal NECK: No JVD; No carotid bruits LYMPHATICS: No lymphadenopathy CARDIAC: S1S2 noted,RRR, no murmurs, rubs, gallops RESPIRATORY:  Clear to auscultation without rales, wheezing or rhonchi  ABDOMEN: Soft, non-tender, non-distended, +bowel sounds, no guarding. EXTREMITIES: No edema, No cyanosis, no clubbing MUSCULOSKELETAL:  No deformity  SKIN: Warm and dry NEUROLOGIC:  Alert and oriented x 3, non-focal PSYCHIATRIC:  Normal affect, good insight  ASSESSMENT:    1. Coronary artery disease of native artery of native heart with stable angina pectoris ([place])   2. Cigarette smoker   3. Hyperlipidemia, unspecified hyperlipidemia type   4. Primary hypertension   5. Obesity (BMI 30-39.9)   6. Dyspnea on exertion    PLAN:     His chest pain is concerning given his high risk factors for progression of coronary artery disease and in the setting of his normal nuclear stress test in 2019 having the best diagnostic testing for this patient is moving forward with a left heart catheterization.  The patient understands that risks include but are not limited to stroke (1 in 1000), death (1 in 8), kidney failure [usually temporary] (1 in 500), bleeding (1 in 200), allergic reaction [possibly serious] (1 in 200), and agrees to proceed.  In the meantime I am going to place the patient on Imdur 30 mg daily hoping this will also help with his elevated blood pressure as well.  As needed nitroglycerin and if chest pain persists the patient has been advised to go to the emergency department.  He was on both atorvastatin 80 mg and Crestor 40 mg with Zetia 10 mg.  I  reviewed his lipid profile which was done at his PCP office total cholesterol 159, triglyceride 291, HDL 24, LDL 86 this was done in July 2022.  I have educated the patient that we can keep him on 1 high intensity statin which should be the Crestor 40 mg daily.  Stopped her atorvastatin.  For his  hypertriglyceridemia he is already on fenofibrate as well I am going to add Vascepa 500 mg twice a day for now.  In the meantime I will get lipid profile as well as LP(a) as I do this is believed that the patient would benefit from PCSK9 inhibitors as well.  Decision for referral to the lipid clinic will be made once his lipid profile is available.  His hemoglobin A1c on October 27, 2019 was 6.5 advised the patient that this is the diagnosis for diabetes he expresses understanding.  He says that he has been advised of this in the past but have opted for diet modification.  Unfortunately he still smokes, smoking cessation was advised today.  The patient understands the need to lose weight with diet and exercise. We have discussed specific strategies for this.  For completeness he is got bilateral leg edema and shortness of breath an echocardiogram will be done to assess LV function and for any structural abnormality as well as diastolic dysfunction.  The patient is in agreement with the above plan. The patient left the office in stable condition.  The patient will follow up i 4 weeks post heart catheterization.   Medication Adjustments/Labs and Tests Ordered: Current medicines are reviewed at length with the patient today.  Concerns regarding medicines are outlined above.  Orders Placed This Encounter  Procedures   Lipid panel   Lipoprotein A (LPA)   Basic metabolic panel   Magnesium   CBC with Differential/Platelet   EKG 12-Lead   ECHOCARDIOGRAM COMPLETE   Meds ordered this encounter  Medications   Icosapent Ethyl (VASCEPA) 0.5 g CAPS    Sig: Take 500 mg by mouth daily.    Dispense:  90 capsule     Refill:  3   isosorbide mononitrate (IMDUR) 30 MG 24 hr tablet    Sig: Take 1 tablet (30 mg total) by mouth daily.    Dispense:  90 tablet    Refill:  3     Patient Instructions  Medication Instructions:  Your physician has recommended you make the following change in your medication:  STOP: Lipitor START: Vascepa 500 mg (0.5 grams) twice daily START: Imdur 30 mg once daily *If you need a refill on your cardiac medications before your next appointment, please call your pharmacy*   Lab Work: Your physician recommends that you return for lab work in:  TODAY: BMET, [place], CBC If you have labs (blood work) drawn today and your tests are completely normal, you will receive your results only by: MyChart Message (if you have MyChart) OR A paper copy in the mail If you have any lab test that is abnormal or we need to change your treatment, we will call you to review the results.   Testing/Procedures:  [place] AT [address] [place] [address] Dept: [phone] Loc: [place]  01/15/2021  You are scheduled for a Cardiac Catheterization on Wednesday, October 5 with Dr. Sherren Mocha.  1. Please arrive at the [facility] (Main Entrance A) at [facility]: [address] [place], [place] [address] at 6:30 AM (This time is two hours before your procedure to ensure your preparation). Free valet parking service is available.   Special note: Every effort is made to have your procedure done on time. Please understand that emergencies sometimes delay scheduled procedures.  2. Diet: Do not eat solid foods after midnight.  The patient may have clear  liquids until 5am upon the day of the procedure.  3. Labs: You will need to have blood drawn on TODAY  4. Medication instructions in preparation for your procedure:   Contrast Allergy: No  On the morning of your procedure, take your  Aspirin and any morning medicines NOT listed above.  You may use sips of water.  5. Plan for one night stay--bring personal belongings. 6. Bring a current list of your medications and current insurance cards. 7. You MUST have a responsible person to drive you home. 8. Someone MUST be with you the first 24 hours after you arrive home or your discharge will be delayed. 9. Please wear clothes that are easy to get on and off and wear slip-on shoes.  Thank you for allowing [place] to care for you!   -- [place] [facility]    Follow-Up: At [facility], you and your health needs are our priority.  As part of our continuing mission to provide you with exceptional heart care, we have created designated Provider Care Teams.  These Care Teams include your primary Cardiologist (physician) and Advanced Practice Providers (APPs -  Physician Assistants and Nurse Practitioners) who all work together to provide you with the care you need, when you need it.  We recommend signing up for the patient portal called "MyChart".  Sign up information is provided on this After Visit Summary.  MyChart is used to connect with patients for Virtual Visits (Telemedicine).  Patients are able to view lab/test results, encounter notes, upcoming appointments, etc.  Non-urgent messages can be sent to your provider as well.   To learn more about what you can do with MyChart, go to [email or web address].    Your next appointment:   4 weeks  The format for your next appointment:   In Person  Provider:   Berniece Salines, DO [address], [place], [place] [address]    Other Instructions     Adopting a Healthy Lifestyle.  Know what a healthy weight is for you (roughly BMI <25) and aim to maintain this   Aim for 7+ servings of fruits and vegetables daily   65-80+ fluid ounces of water or unsweet tea for healthy kidneys   Limit to max 1 drink of alcohol per day; avoid smoking/tobacco   Limit  animal fats in diet for cholesterol and heart health - choose grass fed whenever available   Avoid highly processed foods, and foods high in saturated/trans fats   Aim for low stress - take time to unwind and care for your mental health   Aim for 150 min of moderate intensity exercise weekly for heart health, and weights twice weekly for bone health   Aim for 7-9 hours of sleep daily   When it comes to diets, agreement about the perfect plan isnt easy to find, even among the experts. Experts at the [employer] developed an idea known as the Healthy Eating Plate. Just imagine a plate divided into logical, healthy portions.   The emphasis is on diet quality:   Load up on vegetables and fruits - one-half of your plate: Aim for color and variety, and remember that potatoes dont count.   Go for whole grains - one-quarter of your plate: Whole wheat, barley, wheat berries, quinoa, oats, brown rice, and foods made with them. If you want pasta, go with whole wheat pasta.   Protein power - one-quarter of your plate: Fish, chicken, beans, and nuts are all  healthy, versatile protein sources. Limit red meat.   The diet, however, does go beyond the plate, offering a few other suggestions.   Use healthy plant oils, such as olive, canola, soy, corn, sunflower and peanut. Check the labels, and avoid partially hydrogenated oil, which have unhealthy trans fats.   If youre thirsty, drink water. Coffee and tea are good in moderation, but skip sugary drinks and limit milk and dairy products to one or two daily servings.   The type of carbohydrate in the diet is more important than the amount. Some sources of carbohydrates, such as vegetables, fruits, whole grains, and beans-are healthier than others.   Finally, stay active  Signed, Berniece Salines, DO  01/15/2021 11:55 AM    [place]

## 2021-01-15 NOTE — Telephone Encounter (Signed)
PA started on CMM for Vascepa 0.5 mg.

## 2021-01-15 NOTE — Telephone Encounter (Signed)
PA approved for Vascepa until 01-15-22.

## 2021-01-15 NOTE — Progress Notes (Signed)
Cardiology Office Note:    Date:  01/15/2021   ID:  Billy Cummings, DOB 1970-05-24, MRN [id number]  PCP:  Pc, [facility]  Cardiologist:  Berniece Salines, DO  Electrophysiologist:  None   Referring MD: Pc, [facility]*   Chief Complaint  Patient presents with   Chest Pain   Shortness of Breath   Leg Swelling   restless leg    History of Present Illness:    Billy Cummings is a 50 y.o. male with a hx of coronary artery disease status post stent to the RCA x2 in 2017 and then previous stent to the RCA in 2012, dyslipidemia, hypertriglyceridemia, hypertension, current smoker.  The patient previously followed with Dr. Geraldo Pitter and was last seen by him March 24, 2020.  He tells me today that he is concerned because recently he has been experiencing intermittent chest discomfort.  He describes it as a midsternal pressure-like and sometimes burning sensation.  It starts mid chest sometimes it would spread diffusely across his chest but most times it goes up to his neck and once is there he feels as if his neck is experiencing a spasm.  When this occurred he has associated shortness of breath.  Recently the pain has gotten frequent in episodes and he has started using nitroglycerin.  He is concerned about this.    He also noticed recently he has had some increasing leg edema.  He admits to isolated shortness of breath outside of the chest pain as well per  Past Medical History:  Diagnosis Date   Anxiety    Arthritis    Cigarette smoker 08/29/2015   Coronary artery disease involving native coronary artery of native heart without angina pectoris 08/29/2015   Overview:  STEMI -late restenosis ,inferior wall--stenting of RCA x 2 08/15/15 and old PCI and stent of RCA in 2012   Depression    Dyslipidemia 08/29/2015   Essential hypertension 02/23/2020   History of myocardial infarction    History of TIA (transient ischemic attack) 2021   Hypertriglyceridemia 05/11/2017    Leukocytosis 07/17/2020   OSA (obstructive sleep apnea) 06/28/2020   Severe, AHI 40/hour-210 pounds   Restless leg syndrome     Past Surgical History:  Procedure Laterality Date   CORONARY ANGIOPLASTY WITH STENT PLACEMENT Left 08/15/2015    Current Medications: Current Meds  Medication Sig   albuterol (VENTOLIN HFA) 108 (90 Base) MCG/ACT inhaler Inhale 1 puff into the lungs every 4 (four) hours as needed for wheezing or shortness of breath.   aspirin EC 81 MG tablet Take 1 tablet (81 mg total) by mouth daily.   busPIRone (BUSPAR) 5 MG tablet Take 5 mg by mouth 2 (two) times daily as needed for anxiety.   citalopram (CELEXA) 20 MG tablet Take 30 mg by mouth daily.   diclofenac Sodium (VOLTAREN) 1 % GEL Apply 1 application topically daily.   doxycycline (VIBRAMYCIN) 100 MG capsule Take 100 mg by mouth every 12 (twelve) hours.   ezetimibe (ZETIA) 10 MG tablet Take 10 mg by mouth daily.   famotidine (PEPCID) 20 MG tablet Take 20 mg by mouth daily.   fenofibrate 160 MG tablet Take 160 mg by mouth at bedtime.   fluticasone (FLOVENT HFA) 44 MCG/ACT inhaler Inhale 2 puffs into the lungs 2 (two) times daily.   folic acid (FOLVITE) 1 MG tablet Take 1 mg by mouth daily.   hydrochlorothiazide (HYDRODIURIL) 12.5 MG tablet Take 12.5 mg by mouth every morning.   hydrOXYzine (  ATARAX/VISTARIL) 50 MG tablet Take 50 mg by mouth every 6 (six) hours as needed for anxiety.   Icosapent Ethyl (VASCEPA) 0.5 g CAPS Take 500 mg by mouth daily.   isosorbide mononitrate (IMDUR) 30 MG 24 hr tablet Take 1 tablet (30 mg total) by mouth daily.   levocetirizine (XYZAL) 5 MG tablet Take 5 mg by mouth at bedtime.   meclizine (ANTIVERT) 12.5 MG tablet Take 12.5 mg by mouth 3 (three) times daily as needed for dizziness.   nitroGLYCERIN (NITROSTAT) 0.4 MG SL tablet Place 0.4 mg under the tongue every 5 (five) minutes as needed for chest pain. As directed   omega-3 acid ethyl esters (LOVAZA) 1 g capsule Take 2 capsules by  mouth 2 (two) times daily.   pantoprazole (PROTONIX) 40 MG tablet Take 40 mg by mouth 2 (two) times daily.   pramipexole (MIRAPEX) 0.25 MG tablet Take 0.25 mg by mouth daily. Take 2 tablets daily 2-3 hours before bedtime   rosuvastatin (CRESTOR) 40 MG tablet Take 40 mg by mouth daily.   Vitamin D, Ergocalciferol, (DRISDOL) 1.25 MG (50000 UNIT) CAPS capsule Take 50,000 Units by mouth once a week.   [DISCONTINUED] atorvastatin (LIPITOR) 80 MG tablet Take 80 mg by mouth daily.     Allergies:   Patient has no known allergies.   Social History   Socioeconomic History   Marital status: Divorced    Spouse name: Not on file   Number of children: Not on file   Years of education: Not on file   Highest education level: Not on file  Occupational History   Not on file  Tobacco Use   Smoking status: Every Day    Packs/day: 1.50    Years: 38.00    Pack years: 57.00    Types: Cigarettes   Smokeless tobacco: Never   Tobacco comments:    smokes 1 pack per day 06/28/2020  Vaping Use   Vaping Use: Never used  Substance and Sexual Activity   Alcohol use: No    Alcohol/week: 0.0 standard drinks   Drug use: No   Sexual activity: Not on file  Other Topics Concern   Not on file  Social History Narrative   Not on file   Social Determinants of Health   Financial Resource Strain: Not on file  Food Insecurity: Not on file  Transportation Needs: Not on file  Physical Activity: Not on file  Stress: Not on file  Social Connections: Not on file     Family History: The patient's family history includes Bladder Cancer in his brother; Breast cancer in his mother; Heart attack in his brother, father, and mother; Heart disease in his brother, father, and mother.  ROS:   Review of Systems  Constitution: Negative for decreased appetite, fever and weight gain.  HENT: Negative for congestion, ear discharge, hoarse voice and sore throat.   Eyes: Negative for discharge, redness, vision loss in right  eye and visual halos.  Cardiovascular: Reports chest pain, dyspnea on exertion, leg swelling.  Negative for, orthopnea and palpitations.  Respiratory: Negative for cough, hemoptysis, shortness of breath and snoring.   Endocrine: Negative for heat intolerance and polyphagia.  Hematologic/Lymphatic: Negative for bleeding problem. Does not bruise/bleed easily.  Skin: Negative for flushing, nail changes, rash and suspicious lesions.  Musculoskeletal: Negative for arthritis, joint pain, muscle cramps, myalgias, neck pain and stiffness.  Gastrointestinal: Negative for abdominal pain, bowel incontinence, diarrhea and excessive appetite.  Genitourinary: Negative for decreased libido, genital sores and incomplete  emptying.  Neurological: Negative for brief paralysis, focal weakness, headaches and loss of balance.  Psychiatric/Behavioral: Negative for altered mental status, depression and suicidal ideas.  Allergic/Immunologic: Negative for HIV exposure and persistent infections.    EKGs/Labs/Other Studies Reviewed:    The following studies were reviewed today:   EKG:  The ekg ordered today demonstrates sinus rhythm, heart rate 77 bpm with right-sided interventricular conduction defect and prolonged QTC, compared to EKG done in January 2021 prolonged QTC is  new  Pharmacologic nuclear stress test January 2019 Nuclear stress EF: 61%. No wall motion abnormalities. There was no ST segment deviation noted during stress. There is basal inferior perfusion defect at rest. Normal-appearing at stress. This is a low risk study. No perfusion evidence of ischemia. Myocardial thickening noted, consider left ventricular hypertrophy.   Candee Furbish, MD    Recent Labs: 11/16/2020: ALT 60; BUN 13; Creatinine 1.1; Hemoglobin 17.0; Platelets 314; Potassium 3.9; Sodium 138  Recent Lipid Panel No results found for: CHOL, TRIG, HDL, CHOLHDL, VLDL, LDLCALC, LDLDIRECT  Physical Exam:    VS:  BP (!) 154/100 (BP  Location: Left Arm, Patient Position: Sitting)   Pulse 73   Ht 6\' 2"  (1.88 m)   Wt 245 lb (111.1 kg)   SpO2 93%   BMI 31.46 kg/m     Wt Readings from Last 3 Encounters:  01/15/21 245 lb (111.1 kg)  11/16/20 239 lb 11.2 oz (108.7 kg)  06/28/20 242 lb (109.8 kg)     GEN: Well nourished, well developed in no acute distress HEENT: Normal NECK: No JVD; No carotid bruits LYMPHATICS: No lymphadenopathy CARDIAC: S1S2 noted,RRR, no murmurs, rubs, gallops RESPIRATORY:  Clear to auscultation without rales, wheezing or rhonchi  ABDOMEN: Soft, non-tender, non-distended, +bowel sounds, no guarding. EXTREMITIES: No edema, No cyanosis, no clubbing MUSCULOSKELETAL:  No deformity  SKIN: Warm and dry NEUROLOGIC:  Alert and oriented x 3, non-focal PSYCHIATRIC:  Normal affect, good insight  ASSESSMENT:    1. Coronary artery disease of native artery of native heart with stable angina pectoris ([place])   2. Cigarette smoker   3. Hyperlipidemia, unspecified hyperlipidemia type   4. Primary hypertension   5. Obesity (BMI 30-39.9)   6. Dyspnea on exertion    PLAN:     His chest pain is concerning given his high risk factors for progression of coronary artery disease and in the setting of his normal nuclear stress test in 2019 having the best diagnostic testing for this patient is moving forward with a left heart catheterization.  The patient understands that risks include but are not limited to stroke (1 in 1000), death (1 in 76), kidney failure [usually temporary] (1 in 500), bleeding (1 in 200), allergic reaction [possibly serious] (1 in 200), and agrees to proceed.  In the meantime I am going to place the patient on Imdur 30 mg daily hoping this will also help with his elevated blood pressure as well.  As needed nitroglycerin and if chest pain persists the patient has been advised to go to the emergency department.  He was on both atorvastatin 80 mg and Crestor 40 mg with Zetia 10 mg.  I  reviewed his lipid profile which was done at his PCP office total cholesterol 159, triglyceride 291, HDL 24, LDL 86 this was done in July 2022.  I have educated the patient that we can keep him on 1 high intensity statin which should be the Crestor 40 mg daily.  Stopped her atorvastatin.  For his  hypertriglyceridemia he is already on fenofibrate as well I am going to add Vascepa 500 mg twice a day for now.  In the meantime I will get lipid profile as well as LP(a) as I do this is believed that the patient would benefit from PCSK9 inhibitors as well.  Decision for referral to the lipid clinic will be made once his lipid profile is available.  His hemoglobin A1c on October 27, 2019 was 6.5 advised the patient that this is the diagnosis for diabetes he expresses understanding.  He says that he has been advised of this in the past but have opted for diet modification.  Unfortunately he still smokes, smoking cessation was advised today.  The patient understands the need to lose weight with diet and exercise. We have discussed specific strategies for this.  For completeness he is got bilateral leg edema and shortness of breath an echocardiogram will be done to assess LV function and for any structural abnormality as well as diastolic dysfunction.  The patient is in agreement with the above plan. The patient left the office in stable condition.  The patient will follow up i 4 weeks post heart catheterization.   Medication Adjustments/Labs and Tests Ordered: Current medicines are reviewed at length with the patient today.  Concerns regarding medicines are outlined above.  Orders Placed This Encounter  Procedures   Lipid panel   Lipoprotein A (LPA)   Basic metabolic panel   Magnesium   CBC with Differential/Platelet   EKG 12-Lead   ECHOCARDIOGRAM COMPLETE   Meds ordered this encounter  Medications   Icosapent Ethyl (VASCEPA) 0.5 g CAPS    Sig: Take 500 mg by mouth daily.    Dispense:  90 capsule     Refill:  3   isosorbide mononitrate (IMDUR) 30 MG 24 hr tablet    Sig: Take 1 tablet (30 mg total) by mouth daily.    Dispense:  90 tablet    Refill:  3     Patient Instructions  Medication Instructions:  Your physician has recommended you make the following change in your medication:  STOP: Lipitor START: Vascepa 500 mg (0.5 grams) twice daily START: Imdur 30 mg once daily *If you need a refill on your cardiac medications before your next appointment, please call your pharmacy*   Lab Work: Your physician recommends that you return for lab work in:  TODAY: BMET, [place], CBC If you have labs (blood work) drawn today and your tests are completely normal, you will receive your results only by: MyChart Message (if you have MyChart) OR A paper copy in the mail If you have any lab test that is abnormal or we need to change your treatment, we will call you to review the results.   Testing/Procedures:  [facility] [address] [place] [address] Dept: [phone] Loc: [place]  01/15/2021  You are scheduled for a Cardiac Catheterization on Wednesday, October 5 with Dr. Sherren Mocha.  1. Please arrive at the [facility] (Main Entrance A) at [facility]: [address] [place], [place] [address] at 6:30 AM (This time is two hours before your procedure to ensure your preparation). Free valet parking service is available.   Special note: Every effort is made to have your procedure done on time. Please understand that emergencies sometimes delay scheduled procedures.  2. Diet: Do not eat solid foods after midnight.  The patient may have clear  liquids until 5am upon the day of the procedure.  3. Labs: You will need to have blood drawn on TODAY  4. Medication instructions in preparation for your procedure:   Contrast Allergy: No  On the morning of your procedure, take your  Aspirin and any morning medicines NOT listed above.  You may use sips of water.  5. Plan for one night stay--bring personal belongings. 6. Bring a current list of your medications and current insurance cards. 7. You MUST have a responsible person to drive you home. 8. Someone MUST be with you the first 24 hours after you arrive home or your discharge will be delayed. 9. Please wear clothes that are easy to get on and off and wear slip-on shoes.  Thank you for allowing [place] to care for you!   -- [facility]    Follow-Up: At [facility], you and your health needs are our priority.  As part of our continuing mission to provide you with exceptional heart care, we have created designated Provider Care Teams.  These Care Teams include your primary Cardiologist (physician) and Advanced Practice Providers (APPs -  Physician Assistants and Nurse Practitioners) who all work together to provide you with the care you need, when you need it.  We recommend signing up for the patient portal called "MyChart".  Sign up information is provided on this After Visit Summary.  MyChart is used to connect with patients for Virtual Visits (Telemedicine).  Patients are able to view lab/test results, encounter notes, upcoming appointments, etc.  Non-urgent messages can be sent to your provider as well.   To learn more about what you can do with MyChart, go to [email or web address].    Your next appointment:   4 weeks  The format for your next appointment:   In Person  Provider:   Berniece Salines, DO [address], [place], [place] [address]    Other Instructions     Adopting a Healthy Lifestyle.  Know what a healthy weight is for you (roughly BMI <25) and aim to maintain this   Aim for 7+ servings of fruits and vegetables daily   65-80+ fluid ounces of water or unsweet tea for healthy kidneys   Limit to max 1 drink of alcohol per day; avoid smoking/tobacco   Limit  animal fats in diet for cholesterol and heart health - choose grass fed whenever available   Avoid highly processed foods, and foods high in saturated/trans fats   Aim for low stress - take time to unwind and care for your mental health   Aim for 150 min of moderate intensity exercise weekly for heart health, and weights twice weekly for bone health   Aim for 7-9 hours of sleep daily   When it comes to diets, agreement about the perfect plan isnt easy to find, even among the experts. Experts at the [employer] developed an idea known as the Healthy Eating Plate. Just imagine a plate divided into logical, healthy portions.   The emphasis is on diet quality:   Load up on vegetables and fruits - one-half of your plate: Aim for color and variety, and remember that potatoes dont count.   Go for whole grains - one-quarter of your plate: Whole wheat, barley, wheat berries, quinoa, oats, brown rice, and foods made with them. If you want pasta, go with whole wheat pasta.   Protein power - one-quarter of your plate: Fish, chicken, beans, and nuts are all  healthy, versatile protein sources. Limit red meat.   The diet, however, does go beyond the plate, offering a few other suggestions.   Use healthy plant oils, such as olive, canola, soy, corn, sunflower and peanut. Check the labels, and avoid partially hydrogenated oil, which have unhealthy trans fats.   If youre thirsty, drink water. Coffee and tea are good in moderation, but skip sugary drinks and limit milk and dairy products to one or two daily servings.   The type of carbohydrate in the diet is more important than the amount. Some sources of carbohydrates, such as vegetables, fruits, whole grains, and beans-are healthier than others.   Finally, stay active  Signed, Berniece Salines, DO  01/15/2021 11:55 AM    [place]

## 2021-01-17 ENCOUNTER — Ambulatory Visit (INDEPENDENT_AMBULATORY_CARE_PROVIDER_SITE_OTHER): Payer: No Typology Code available for payment source

## 2021-01-17 ENCOUNTER — Other Ambulatory Visit: Payer: No Typology Code available for payment source

## 2021-01-17 ENCOUNTER — Other Ambulatory Visit: Payer: Self-pay

## 2021-01-17 DIAGNOSIS — I25118 Atherosclerotic heart disease of native coronary artery with other forms of angina pectoris: Secondary | ICD-10-CM | POA: Diagnosis not present

## 2021-01-17 LAB — ECHOCARDIOGRAM COMPLETE
Area-P 1/2: 3.45 cm2
S' Lateral: 2.8 cm

## 2021-01-21 ENCOUNTER — Telehealth: Payer: Self-pay | Admitting: Cardiology

## 2021-01-21 NOTE — Telephone Encounter (Signed)
Pt returning call in regards to echo results.

## 2021-01-21 NOTE — Telephone Encounter (Signed)
Left message for patient to return the call.

## 2021-01-21 NOTE — Telephone Encounter (Signed)
Spoke with patient, see chart.    

## 2021-01-22 ENCOUNTER — Telehealth: Payer: Self-pay

## 2021-01-22 ENCOUNTER — Telehealth: Payer: Self-pay | Admitting: *Deleted

## 2021-01-22 DIAGNOSIS — Z79899 Other long term (current) drug therapy: Secondary | ICD-10-CM

## 2021-01-22 DIAGNOSIS — R7989 Other specified abnormal findings of blood chemistry: Secondary | ICD-10-CM

## 2021-01-22 NOTE — Telephone Encounter (Signed)
Cardiac catheterization scheduled at [facility] for: Wednesday January 23, 2021 8:30 AM [facility] Main Entrance A [facility]) at: 6:30 AM   No solid food after midnight prior to cath, clear liquids until 5 AM day of procedure.  Medication instructions: Hold: HCTZ-AM of procedure  Except hold medications usual morning medications can be taken pre-cath with sips of water including aspirin 81 mg.    Confirmed patient has responsible adult to drive home post procedure and be with patient first 24 hours after arriving home.  [facility] does allow one visitor to accompany you and wait in the hospital waiting room while you are there for your procedure. You and your visitor will be asked to wear a mask once you enter the hospital.   Patient reports does not currently have any symptoms concerning for COVID-19 and no household members with COVID-19 like illness.      Reviewed procedure/mask/visitor instructions with patient.

## 2021-01-22 NOTE — Telephone Encounter (Signed)
Called pt to let know know we received some labs are Dr. Harriet Masson would like them to be repeated in 2 weeks and reinforce she wants him to stop taking Lipitor and continue Crestor. No answer at this time. Left message on the machine. Orders placed to repeat labs.

## 2021-01-23 ENCOUNTER — Ambulatory Visit (HOSPITAL_COMMUNITY)
Admission: RE | Admit: 2021-01-23 | Discharge: 2021-01-23 | Disposition: A | Discharge status: Home / Self Care | Payer: PRIVATE HEALTH INSURANCE | Attending: Cardiovascular Disease | Admitting: Cardiovascular Disease

## 2021-01-23 ENCOUNTER — Other Ambulatory Visit (HOSPITAL_COMMUNITY): Payer: Self-pay

## 2021-01-23 ENCOUNTER — Ambulatory Visit (HOSPITAL_COMMUNITY)
Admission: RE | Disposition: A | Discharge status: Home / Self Care | Payer: Self-pay | Source: Home / Self Care | Attending: Cardiovascular Disease

## 2021-01-23 ENCOUNTER — Other Ambulatory Visit: Payer: Self-pay

## 2021-01-23 DIAGNOSIS — Z79899 Other long term (current) drug therapy: Secondary | ICD-10-CM | POA: Diagnosis not present

## 2021-01-23 DIAGNOSIS — F17210 Nicotine dependence, cigarettes, uncomplicated: Secondary | ICD-10-CM | POA: Diagnosis not present

## 2021-01-23 DIAGNOSIS — I25118 Atherosclerotic heart disease of native coronary artery with other forms of angina pectoris: Secondary | ICD-10-CM

## 2021-01-23 DIAGNOSIS — Z7951 Long term (current) use of inhaled steroids: Secondary | ICD-10-CM | POA: Insufficient documentation

## 2021-01-23 DIAGNOSIS — E781 Pure hyperglyceridemia: Secondary | ICD-10-CM | POA: Insufficient documentation

## 2021-01-23 DIAGNOSIS — I10 Essential (primary) hypertension: Secondary | ICD-10-CM | POA: Insufficient documentation

## 2021-01-23 DIAGNOSIS — Z7982 Long term (current) use of aspirin: Secondary | ICD-10-CM | POA: Insufficient documentation

## 2021-01-23 DIAGNOSIS — R600 Localized edema: Secondary | ICD-10-CM | POA: Diagnosis not present

## 2021-01-23 DIAGNOSIS — Z6831 Body mass index (BMI) 31.0-31.9, adult: Secondary | ICD-10-CM | POA: Insufficient documentation

## 2021-01-23 DIAGNOSIS — Z955 Presence of coronary angioplasty implant and graft: Secondary | ICD-10-CM | POA: Insufficient documentation

## 2021-01-23 DIAGNOSIS — I25119 Atherosclerotic heart disease of native coronary artery with unspecified angina pectoris: Secondary | ICD-10-CM | POA: Diagnosis not present

## 2021-01-23 DIAGNOSIS — R0609 Other forms of dyspnea: Secondary | ICD-10-CM | POA: Insufficient documentation

## 2021-01-23 DIAGNOSIS — E785 Hyperlipidemia, unspecified: Secondary | ICD-10-CM | POA: Diagnosis not present

## 2021-01-23 DIAGNOSIS — E669 Obesity, unspecified: Secondary | ICD-10-CM | POA: Insufficient documentation

## 2021-01-23 DIAGNOSIS — Z8249 Family history of ischemic heart disease and other diseases of the circulatory system: Secondary | ICD-10-CM | POA: Insufficient documentation

## 2021-01-23 DIAGNOSIS — I209 Angina pectoris, unspecified: Secondary | ICD-10-CM | POA: Diagnosis present

## 2021-01-23 DIAGNOSIS — R0602 Shortness of breath: Secondary | ICD-10-CM | POA: Insufficient documentation

## 2021-01-23 HISTORY — PX: CORONARY STENT INTERVENTION: CATH118234

## 2021-01-23 HISTORY — PX: LEFT HEART CATH AND CORONARY ANGIOGRAPHY: CATH118249

## 2021-01-23 SURGERY — LEFT HEART CATH AND CORONARY ANGIOGRAPHY
Anesthesia: LOCAL

## 2021-01-23 MED ORDER — FENTANYL CITRATE (PF) 100 MCG/2ML IJ SOLN
INTRAMUSCULAR | Status: DC | PRN
  Administered 2021-01-23: 50 ug via INTRAVENOUS
  Administered 2021-01-23: 25 ug via INTRAVENOUS

## 2021-01-23 MED ORDER — SODIUM CHLORIDE 0.9 % IV SOLN: 250.0000 mL | INTRAVENOUS | Status: DC | PRN

## 2021-01-23 MED ORDER — HEPARIN SODIUM (PORCINE) 1000 UNIT/ML IJ SOLN
INTRAMUSCULAR | Status: AC
  Filled 2021-01-23: qty 1

## 2021-01-23 MED ORDER — LABETALOL HCL 5 MG/ML IV SOLN
10.0000 mg | INTRAVENOUS | Status: AC | PRN
  Administered 2021-01-23: 10 mg via INTRAVENOUS
  Filled 2021-01-23: qty 4

## 2021-01-23 MED ORDER — NITROGLYCERIN 1 MG/10 ML FOR IR/CATH LAB
INTRA_ARTERIAL | Status: DC | PRN
  Administered 2021-01-23: 200 ug via INTRACORONARY

## 2021-01-23 MED ORDER — ASPIRIN 81 MG PO CHEW: 81.0000 mg | CHEWABLE_TABLET | ORAL | Status: DC

## 2021-01-23 MED ORDER — CLOPIDOGREL BISULFATE 75 MG PO TABS: 75.0000 mg | ORAL_TABLET | Freq: Every day | ORAL | 0 refills | Status: DC

## 2021-01-23 MED ORDER — ACETAMINOPHEN 325 MG PO TABS: 650.0000 mg | ORAL_TABLET | ORAL | Status: DC | PRN

## 2021-01-23 MED ORDER — SODIUM CHLORIDE 0.9% FLUSH: 3.0000 mL | Freq: Two times a day (BID) | INTRAVENOUS | Status: DC

## 2021-01-23 MED ORDER — CLOPIDOGREL BISULFATE 75 MG PO TABS: 75.0000 mg | ORAL_TABLET | Freq: Every day | ORAL | Status: DC

## 2021-01-23 MED ORDER — SODIUM CHLORIDE 0.9% FLUSH: 3.0000 mL | INTRAVENOUS | Status: DC | PRN

## 2021-01-23 MED ORDER — VERAPAMIL HCL 2.5 MG/ML IV SOLN
INTRAVENOUS | Status: DC | PRN
  Administered 2021-01-23: 10 mL via INTRA_ARTERIAL

## 2021-01-23 MED ORDER — MIDAZOLAM HCL 2 MG/2ML IJ SOLN
INTRAMUSCULAR | Status: DC | PRN
  Administered 2021-01-23: 2 mg via INTRAVENOUS
  Administered 2021-01-23: 1 mg via INTRAVENOUS

## 2021-01-23 MED ORDER — IOHEXOL 350 MG/ML SOLN
INTRAVENOUS | Status: DC | PRN
  Administered 2021-01-23: 100 mL

## 2021-01-23 MED ORDER — CLOPIDOGREL BISULFATE 75 MG PO TABS
75.0000 mg | ORAL_TABLET | Freq: Every day | ORAL | 1 refills | Status: DC
  Filled 2021-01-23: qty 90, 90d supply, fill #0

## 2021-01-23 MED ORDER — HEPARIN SODIUM (PORCINE) 1000 UNIT/ML IJ SOLN
INTRAMUSCULAR | Status: DC | PRN
  Administered 2021-01-23: 3000 [IU] via INTRAVENOUS
  Administered 2021-01-23: 6000 [IU] via INTRAVENOUS
  Administered 2021-01-23: 5000 [IU] via INTRAVENOUS

## 2021-01-23 MED ORDER — NITROGLYCERIN 1 MG/10 ML FOR IR/CATH LAB
INTRA_ARTERIAL | Status: AC
  Filled 2021-01-23: qty 10

## 2021-01-23 MED ORDER — CLOPIDOGREL BISULFATE 300 MG PO TABS
ORAL_TABLET | ORAL | Status: DC | PRN
  Administered 2021-01-23: 600 mg via ORAL

## 2021-01-23 MED ORDER — FENTANYL CITRATE (PF) 100 MCG/2ML IJ SOLN
INTRAMUSCULAR | Status: AC
  Filled 2021-01-23: qty 2

## 2021-01-23 MED ORDER — HYDRALAZINE HCL 20 MG/ML IJ SOLN: 10.0000 mg | INTRAMUSCULAR | Status: AC | PRN

## 2021-01-23 MED ORDER — FAMOTIDINE IN NACL 20-0.9 MG/50ML-% IV SOLN
INTRAVENOUS | Status: AC
  Filled 2021-01-23: qty 50

## 2021-01-23 MED ORDER — LIDOCAINE HCL (PF) 1 % IJ SOLN
INTRAMUSCULAR | Status: AC
  Filled 2021-01-23: qty 30

## 2021-01-23 MED ORDER — FAMOTIDINE IN NACL 20-0.9 MG/50ML-% IV SOLN
INTRAVENOUS | Status: DC | PRN
  Administered 2021-01-23: 20 mg via INTRAVENOUS

## 2021-01-23 MED ORDER — OXYCODONE HCL 5 MG PO TABS: 5.0000 mg | ORAL_TABLET | ORAL | Status: DC | PRN

## 2021-01-23 MED ORDER — SODIUM CHLORIDE 0.9 % WEIGHT BASED INFUSION: 1.0000 mL/kg/h | INTRAVENOUS | Status: DC

## 2021-01-23 MED ORDER — MIDAZOLAM HCL 2 MG/2ML IJ SOLN
INTRAMUSCULAR | Status: AC
  Filled 2021-01-23: qty 2

## 2021-01-23 MED ORDER — HEPARIN (PORCINE) IN NACL 1000-0.9 UT/500ML-% IV SOLN
INTRAVENOUS | Status: DC | PRN
  Administered 2021-01-23 (×2): 500 mL

## 2021-01-23 MED ORDER — LIDOCAINE HCL (PF) 1 % IJ SOLN
INTRAMUSCULAR | Status: DC | PRN
  Administered 2021-01-23 (×2): 2 mL

## 2021-01-23 MED ORDER — CLOPIDOGREL BISULFATE 300 MG PO TABS
ORAL_TABLET | ORAL | Status: AC
  Filled 2021-01-23: qty 1

## 2021-01-23 MED ORDER — CLOPIDOGREL BISULFATE 300 MG PO TABS
ORAL_TABLET | ORAL | Status: AC
  Filled 2021-01-23: qty 2

## 2021-01-23 MED ORDER — ONDANSETRON HCL 4 MG/2ML IJ SOLN: 4.0000 mg | Freq: Four times a day (QID) | INTRAMUSCULAR | Status: DC | PRN

## 2021-01-23 MED ORDER — HEPARIN (PORCINE) IN NACL 1000-0.9 UT/500ML-% IV SOLN
INTRAVENOUS | Status: AC
  Filled 2021-01-23: qty 1000

## 2021-01-23 MED ORDER — SODIUM CHLORIDE 0.9 % WEIGHT BASED INFUSION
3.0000 mL/kg/h | INTRAVENOUS | Status: AC
  Administered 2021-01-23: 3 mL/kg/h via INTRAVENOUS

## 2021-01-23 MED ORDER — VERAPAMIL HCL 2.5 MG/ML IV SOLN
INTRAVENOUS | Status: AC
  Filled 2021-01-23: qty 2

## 2021-01-23 SURGICAL SUPPLY — 18 items
BALLN SAPPHIRE ~~LOC~~ 3.0X15 (BALLOONS) ×1 IMPLANT
BALLN SAPPHIRE ~~LOC~~ 3.75X12 (BALLOONS) ×1 IMPLANT
CATH 5FR JL3.5 JR4 ANG PIG MP (CATHETERS) ×1 IMPLANT
CATH INFINITI 5FR JL4 (CATHETERS) ×1 IMPLANT
CATH LAUNCHER 6FR AL1 (CATHETERS) IMPLANT
CATHETER LAUNCHER 6FR AL1 (CATHETERS) ×2
DEVICE RAD COMP TR BAND LRG (VASCULAR PRODUCTS) ×1 IMPLANT
GLIDESHEATH SLEND SS 6F .021 (SHEATH) ×1 IMPLANT
GUIDEWIRE INQWIRE 1.5J.035X260 (WIRE) IMPLANT
INQWIRE 1.5J .035X260CM (WIRE) ×2
KIT ENCORE 26 ADVANTAGE (KITS) ×1 IMPLANT
KIT HEART LEFT (KITS) ×2 IMPLANT
PACK CARDIAC CATHETERIZATION (CUSTOM PROCEDURE TRAY) ×2 IMPLANT
SHEATH PROBE COVER 6X72 (BAG) ×1 IMPLANT
STENT ONYX FRONTIER 3.5X26 (Permanent Stent) ×1 IMPLANT
TRANSDUCER W/STOPCOCK (MISCELLANEOUS) ×2 IMPLANT
TUBING CIL FLEX 10 FLL-RA (TUBING) ×2 IMPLANT
WIRE COUGAR XT STRL 190CM (WIRE) ×1 IMPLANT

## 2021-01-23 NOTE — Interval H&P Note (Signed)
History and Physical Interval Note:  01/23/2021 8:31 AM  Billy Cummings  has presented today for surgery, with the diagnosis of coronary artery disease.  The various methods of treatment have been discussed with the patient and family. After consideration of risks, benefits and other options for treatment, the patient has consented to  Procedure(s): LEFT HEART CATH AND CORONARY ANGIOGRAPHY (N/A) as a surgical intervention.  The patient's history has been reviewed, patient examined, no change in status, stable for surgery.  I have reviewed the patient's chart and labs.  Questions were answered to the patient's satisfaction.     Sherren Mocha

## 2021-01-23 NOTE — Progress Notes (Signed)
[id number]  Cardiac Rehab Completed stent discharge education with pt and signification other. We discussed stent, Plavix, risk factors, modifications, exercise guidelines, smoking cessation,and Outpt. CRP. They voice understanding. I gave pt stent card, tips for quitting smoking, information for 1-800 quit now line, heart health diet guidelines. Pt and significant other voices interest in quitting smoking.I have strongly encouraged it. I will send referral to Outpt. CRP in [place].

## 2021-01-23 NOTE — Discharge Summary (Signed)
Discharge Summary for Same Day PCI   Patient ID: Billy Cummings MRN: [id number]; DOB: February 23, 1971  Admit date: 01/23/2021 Discharge date: 01/23/2021  Primary Care Provider: Pc, [facility]  Primary Cardiologist: Berniece Salines, DO  Primary Electrophysiologist:  None   Discharge Diagnoses    Active Problems:   Angina pectoris [facility])    Diagnostic Studies/Procedures    Cardiac Catheterization 01/23/2021:  Prox RCA lesion is 20% stenosed.   Mid RCA lesion is 90% stenosed.   A drug-eluting stent was successfully placed using a STENT ONYX FRONTIER 3.5X26.   Post intervention, there is a 0% residual stenosis.   1.  Single-vessel coronary artery disease with severe in-stent restenosis in the distal RCA, treated successfully with PCI using a 3.5 x 26 mm resolute Onyx DES 2.  Patent left main, LAD, and left circumflex with diffuse irregularities and nonobstructive plaquing as detailed 3.  Normal LVEDP  Recommendations: Aspirin and clopidogrel at least 6 months without interruption, consider long-term clopidogrel in this patient with multiple PCI procedures and recurrent in-stent restenosis Same-day discharge if criteria met  Diagnostic Dominance: Right Intervention   _____________   History of Present Illness     Billy Cummings is a 50 y.o. male with a hx of coronary artery disease status post stent to the RCA x2 in 2017 and then previous stent to the RCA in 2012, dyslipidemia, hypertriglyceridemia, hypertension, current smoker.  The patient previously followed with Dr. Geraldo Pitter and was last seen by him March 24, 2020.   He presented to the office recently with Dr. Harriet Masson and reported being concerned because recently he has been experiencing intermittent chest discomfort.  He described it as a midsternal pressure-like and sometimes burning sensation.  It started mid chest sometimes it would spread diffusely across his chest but most times it goes up to his neck and once is there  he feels as if his neck is experiencing a spasm.  When this occurred he has associated shortness of breath.  Recently the pain has gotten frequent in episodes and he has started using nitroglycerin.    He also noticed recently he has had some increasing leg edema.  He admits to isolated shortness of breath outside of the chest pain as well. Given ongoing symptoms a cardiac catheterization was arranged for further evaluation.  Hospital Course     The patient underwent cardiac cath as noted above with 90% mRCA lesion treated with PCI/DES x1. Plan for DAPT with ASA/plavix for at least 6 months, consider long term plavix with multiple PCIs and ISR. The patient was seen by cardiac rehab while in short stay. There were no observed complications post cath. Radial cath site was re-evaluated prior to discharge and found to be stable without any complications. Instructions/precautions regarding cath site care were given prior to discharge.  Billy Cummings was seen by Dr. Burt Knack and determined stable for discharge home. Follow up with our office has been arranged. Medications are listed below. Pertinent changes include N/a .  _____________  Cath/PCI Registry Performance & Quality Measures: Aspirin prescribed? - Yes ADP Receptor Inhibitor (Plavix/Clopidogrel, Brilinta/Ticagrelor or Effient/Prasugrel) prescribed (includes medically managed patients)? - Yes High Intensity Statin (Lipitor 40-77m or Crestor 20-464m prescribed? - Yes For EF <40%, was ACEI/ARB prescribed? - Not Applicable (EF >/= 4049%For EF <40%, Aldosterone Antagonist (Spironolactone or Eplerenone) prescribed? - Not Applicable (EF >/= 4070%Cardiac Rehab Phase II ordered (Included Medically managed Patients)? - Yes  _____________   Discharge Vitals Blood pressure (!) 152/84,  pulse 69, temperature 98 F (36.7 C), temperature source Oral, resp. rate 19, height 6' 2"  (1.88 m), weight 108.9 kg, SpO2 94 %.  Filed Weights   01/23/21 0634   Weight: 108.9 kg    Last Labs & Radiologic Studies    CBC No results for input(s): WBC, NEUTROABS, HGB, HCT, MCV, PLT in the last 72 hours. Basic Metabolic Panel No results for input(s): NA, K, CL, CO2, GLUCOSE, BUN, CREATININE, CALCIUM, MG, PHOS in the last 72 hours. Liver Function Tests No results for input(s): AST, ALT, ALKPHOS, BILITOT, PROT, ALBUMIN in the last 72 hours. No results for input(s): LIPASE, AMYLASE in the last 72 hours. High Sensitivity Troponin:   No results for input(s): TROPONINIHS in the last 720 hours.  BNP Invalid input(s): POCBNP D-Dimer No results for input(s): DDIMER in the last 72 hours. Hemoglobin A1C No results for input(s): HGBA1C in the last 72 hours. Fasting Lipid Panel No results for input(s): CHOL, HDL, LDLCALC, TRIG, CHOLHDL, LDLDIRECT in the last 72 hours. Thyroid Function Tests No results for input(s): TSH, T4TOTAL, T3FREE, THYROIDAB in the last 72 hours.  Invalid input(s): FREET3 _____________  CARDIAC CATHETERIZATION  Result Date: 01/23/2021   Prox RCA lesion is 20% stenosed.   Mid RCA lesion is 90% stenosed.   A drug-eluting stent was successfully placed using a STENT ONYX FRONTIER 3.5X26.   Post intervention, there is a 0% residual stenosis. 1.  Single-vessel coronary artery disease with severe in-stent restenosis in the distal RCA, treated successfully with PCI using a 3.5 x 26 mm resolute Onyx DES 2.  Patent left main, LAD, and left circumflex with diffuse irregularities and nonobstructive plaquing as detailed 3.  Normal LVEDP Recommendations: Aspirin and clopidogrel at least 6 months without interruption, consider long-term clopidogrel in this patient with multiple PCI procedures and recurrent in-stent restenosis Same-day discharge if criteria met  ECHOCARDIOGRAM COMPLETE  Result Date: 01/17/2021    ECHOCARDIOGRAM REPORT   Patient Name:   Billy Cummings Date of Exam: 01/17/2021 Medical Rec #:  [id number]   Height:       74.0 in Accession #:     [id number]  Weight:       245.0 lb Date of Birth:  07-31-1970    BSA:          2.369 m Patient Age:    30 years    BP:           154/100 mmHg Patient Gender: M           HR:           62 bpm. Exam Location:  [facility] Procedure: 2D Echo, Color Doppler and Cardiac Doppler Indications:    Coronary artery disease of native artery of native heart with                 stable angina pectoris ([place]) [I25.118 (ICD-10-CM)]  History:        Patient has no prior history of Echocardiogram examinations.                 TIA; Risk Factors:Current Smoker, Dyslipidemia and Hypertension.  Sonographer:    Luane School RDCS Referring Phys: [id number] [place]  1. Left ventricular ejection fraction, by estimation, is 50 to 55%. The left ventricle has low normal function. The left ventricle has no regional wall motion abnormalities. There is moderate concentric left ventricular hypertrophy. Left ventricular diastolic parameters are consistent with Grade I diastolic dysfunction (impaired relaxation). The average left ventricular  global longitudinal strain is -10.7 %. The global longitudinal strain is abnormal.  2. Right ventricular systolic function is normal. The right ventricular size is normal.  3. The mitral valve is normal in structure. No evidence of mitral valve regurgitation. No evidence of mitral stenosis.  4. The aortic valve is tricuspid. Aortic valve regurgitation is not visualized. No aortic stenosis is present.  5. The inferior vena cava is normal in size with greater than 50% respiratory variability, suggesting right atrial pressure of 3 mmHg. FINDINGS  Left Ventricle: Left ventricular ejection fraction, by estimation, is 50 to 55%. The left ventricle has low normal function. The left ventricle has no regional wall motion abnormalities. The average left ventricular global longitudinal strain is -10.7 %. The global longitudinal strain is abnormal. The left ventricular internal cavity size was normal in size. There is  moderate concentric left ventricular hypertrophy. Left ventricular diastolic parameters are consistent with Grade I diastolic dysfunction (impaired relaxation). Normal left ventricular filling pressure. Right Ventricle: The right ventricular size is normal. No increase in right ventricular wall thickness. Right ventricular systolic function is normal. Left Atrium: Left atrial size was normal in size. Right Atrium: Right atrial size was normal in size. Pericardium: There is no evidence of pericardial effusion. Mitral Valve: The mitral valve is normal in structure. No evidence of mitral valve regurgitation. No evidence of mitral valve stenosis. Tricuspid Valve: The tricuspid valve is normal in structure. Tricuspid valve regurgitation is trivial. No evidence of tricuspid stenosis. Aortic Valve: The aortic valve is tricuspid. Aortic valve regurgitation is not visualized. No aortic stenosis is present. Pulmonic Valve: The pulmonic valve was normal in structure. Pulmonic valve regurgitation is not visualized. No evidence of pulmonic stenosis. Aorta: The aortic root and ascending aorta are structurally normal, with no evidence of dilitation and the ascending aorta was not well visualized. Venous: A normal flow pattern is recorded from the right upper pulmonary vein. The inferior vena cava is normal in size with greater than 50% respiratory variability, suggesting right atrial pressure of 3 mmHg. IAS/Shunts: No atrial level shunt detected by color flow Doppler.  LEFT VENTRICLE PLAX 2D LVIDd:         4.50 cm  Diastology LVIDs:         2.80 cm  LV e' medial:    5.55 cm/s LV PW:         1.40 cm  LV E/e' medial:  11.1 LV IVS:        1.40 cm  LV e' lateral:   8.70 cm/s LVOT diam:     2.10 cm  LV E/e' lateral: 7.1 LV SV:         53 LV SV Index:   23       2D Longitudinal Strain LVOT Area:     3.46 cm 2D Strain GLS Avg:     -10.7 %  RIGHT VENTRICLE RV S prime:     9.46 cm/s TAPSE (M-mode): 1.8 cm LEFT ATRIUM             Index        RIGHT ATRIUM           Index LA diam:        3.80 cm 1.60 cm/m  RA Area:     13.50 cm LA Vol (A2C):   60.4 ml 25.49 ml/m RA Volume:   28.60 ml  12.07 ml/m LA Vol (A4C):   49.7 ml 20.98 ml/m LA Biplane Vol: 56.9 ml 24.02 ml/m  AORTIC VALVE LVOT Vmax:   71.10 cm/s LVOT Vmean:  50.300 cm/s LVOT VTI:    0.154 m  AORTA Ao Root diam: 3.30 cm Ao Asc diam:  3.40 cm MITRAL VALVE               TRICUSPID VALVE MV Area (PHT): 3.45 cm    TR Peak grad:   12.2 mmHg MV Decel Time: 220 msec    TR Vmax:        175.00 cm/s MV E velocity: 61.70 cm/s MV A velocity: 51.00 cm/s  SHUNTS MV E/A ratio:  1.21        Systemic VTI:  0.15 m                            Systemic Diam: 2.10 cm Shirlee More MD Electronically signed by Shirlee More MD Signature Date/Time: 01/17/2021/4:50:04 PM    Final     Disposition   Pt is being discharged home today in good condition.  Follow-up Plans & Appointments     Follow-up Information     Lendon Colonel, NP Follow up on 02/08/2021.   Specialties: Nurse Practitioner, Radiology, Cardiology Why: at 9:15am for your follow up appt with Dr. Terrial Rhodes NP Contact information: [address] [place] [address] [phone]                Discharge Instructions     Amb Referral to Cardiac Rehabilitation   Complete by: As directed    To [facility]   Diagnosis:  Coronary Stents PTCA     After initial evaluation and assessments completed: Virtual Based Care may be provided alone or in conjunction with Phase 2 Cardiac Rehab based on patient barriers.: Yes        Discharge Medications   Allergies as of 01/23/2021   No Known Allergies      Medication List     TAKE these medications    albuterol 108 (90 Base) MCG/ACT inhaler Commonly known as: VENTOLIN HFA Inhale 1 puff into the lungs every 4 (four) hours as needed for wheezing or shortness of breath.   aspirin EC 81 MG tablet Take 81 mg by mouth at bedtime. Swallow whole.   busPIRone 5 MG  tablet Commonly known as: BUSPAR Take 5 mg by mouth 2 (two) times daily as needed for anxiety.   citalopram 20 MG tablet Commonly known as: CELEXA Take 40 mg by mouth at bedtime.   clopidogrel 75 MG tablet Commonly known as: Plavix Take 1 tablet (75 mg total) by mouth daily.   diclofenac Sodium 1 % Gel Commonly known as: VOLTAREN Apply 1 application topically 2 (two) times daily as needed (pain).   fenofibrate 160 MG tablet Take 160 mg by mouth at bedtime.   fluticasone 44 MCG/ACT inhaler Commonly known as: FLOVENT HFA Inhale 2 puffs into the lungs in the morning and at bedtime.   folic acid 1 MG tablet Commonly known as: FOLVITE Take 1 mg by mouth at bedtime.   hydrochlorothiazide 12.5 MG tablet Commonly known as: HYDRODIURIL Take 12.5 mg by mouth daily as needed (leg swelling).   hydrOXYzine 50 MG tablet Commonly known as: ATARAX/VISTARIL Take 50 mg by mouth every 6 (six) hours as needed for anxiety.   isosorbide mononitrate 30 MG 24 hr tablet Commonly known as: IMDUR Take 1 tablet (30 mg total) by mouth daily.   meclizine 12.5 MG tablet Commonly known as: ANTIVERT Take 12.5  mg by mouth 3 (three) times daily as needed for dizziness.   nitroGLYCERIN 0.4 MG SL tablet Commonly known as: NITROSTAT Place 0.4 mg under the tongue every 5 (five) minutes as needed for chest pain. As directed   rosuvastatin 40 MG tablet Commonly known as: CRESTOR Take 40 mg by mouth at bedtime.   Vascepa 0.5 g Caps Generic drug: Icosapent Ethyl Take 500 mg by mouth daily.        Allergies No Known Allergies  Outstanding Labs/Studies   N/a   Duration of Discharge Encounter   Greater than 30 minutes including physician time.  Signed, Reino Bellis, NP 01/23/2021, 2:50 PM

## 2021-01-24 ENCOUNTER — Encounter (HOSPITAL_COMMUNITY): Payer: Self-pay | Admitting: Cardiovascular Disease

## 2021-01-24 LAB — POCT ACTIVATED CLOTTING TIME: Activated Clotting Time: 260 seconds

## 2021-01-25 ENCOUNTER — Telehealth (HOSPITAL_COMMUNITY): Payer: Self-pay

## 2021-01-25 NOTE — Telephone Encounter (Signed)
Per phase I cardiac rehab, fax cardiac rehab referral to [facility]. °

## 2021-01-29 MED FILL — Clopidogrel Bisulfate Tab 300 MG (Base Equiv): ORAL | Qty: 1 | Status: AC

## 2021-02-05 ENCOUNTER — Telehealth: Payer: Self-pay | Admitting: Cardiology

## 2021-02-05 ENCOUNTER — Other Ambulatory Visit: Payer: Self-pay

## 2021-02-05 MED ORDER — RANOLAZINE ER 500 MG PO TB12: 500.0000 mg | ORAL_TABLET | Freq: Two times a day (BID) | ORAL | 3 refills | Status: DC

## 2021-02-05 MED ORDER — ICOSAPENT ETHYL 1 G PO CAPS: 1.0000 g | ORAL_CAPSULE | Freq: Every day | ORAL | 3 refills | Status: DC

## 2021-02-05 NOTE — Progress Notes (Signed)
New prescription sent to pharmacy 

## 2021-02-05 NOTE — Telephone Encounter (Signed)
Pt c/o medication issue:  1. Name of Medication: Icosapent Ethyl (VASCEPA) 0.5 g CAPS  2. How are you currently taking this medication (dosage and times per day)? 1 capsule daily  3. Are you having a reaction (difficulty breathing--STAT)? no  4. What is your medication issue? Baxter Flattery from [facility] calling to see if the prescription can be changed to the 1 g caps because they have that in stock. Phone: [phone]

## 2021-02-05 NOTE — Telephone Encounter (Signed)
Pt c/o medication issue:  1. Name of Medication: isosorbide mononitrate (IMDUR) 30 MG 24 hr tablet  2. How are you currently taking this medication (dosage and times per day)?  As Directed  3. Are you having a reaction (difficulty breathing--STAT)? No  4. What is your medication issue? Pt gets a bad headache whenever he takes this medicine

## 2021-02-05 NOTE — Telephone Encounter (Signed)
Spoke to the patient. He stated that the Imdur was giving him a headache. He tried for three days but is unable to take it.   He wants to know if he should try half a tablet or discontinue it.

## 2021-02-05 NOTE — Telephone Encounter (Signed)
Called pt left a message to return the call.

## 2021-02-05 NOTE — Telephone Encounter (Signed)
Dr. Harriet Masson, pts pharmacy [facility] is calling to request to change his Vascepa 0.5 mg capsule be changed to taking Vascepa 1 gram capsule take 1 cap po daily, for that's what they have in stock.   Pt was advised to start taking Vascepa 0.5 g caps-and office visit note said to take 500 mg po bid, but 500 mg po daily was sent into the pts pharmacy.   Please advise on Vascepa dosage and if ok to send in 1 gram capsule po daily instead,  to pts pharmacy [facility].      Assessment and plan from last OV note with pt on 9/27 below:  He was on both atorvastatin 80 mg and Crestor 40 mg with Zetia 10 mg.  I reviewed his lipid profile which was done at his PCP office total cholesterol 159, triglyceride 291, HDL 24, LDL 86 this was done in July 2022.  I have educated the patient that we can keep him on 1 high intensity statin which should be the Crestor 40 mg daily.  Stopped her atorvastatin.  For his hypertriglyceridemia he is already on fenofibrate as well I am going to add Vascepa 500 mg twice a day for now.  In the meantime I will get lipid profile as well as LP(a) as I do this is believed that the patient would benefit from PCSK9 inhibitors as well.  Decision for referral to the lipid clinic will be made once his lipid profile is available.

## 2021-02-05 NOTE — Telephone Encounter (Signed)
Called [facility] to let them know Dr. Harriet Masson is d/c Imdur, resubmitting Vascepa for 1 g daily, as well as Ranexa 500 mg twice daily in place of Imdur. Prescription sent to pharmacy.

## 2021-02-05 NOTE — Progress Notes (Signed)
Called [facility] to let them know Dr. Harriet Masson is d/c Imdur, resubmitting Vascepa for 1 g daily, as well as Ranexa 500 mg twice daily in place of Imdur. Prescription sent to pharmacy.

## 2021-02-06 ENCOUNTER — Telehealth (HOSPITAL_COMMUNITY): Payer: Self-pay | Admitting: Pharmacist

## 2021-02-06 NOTE — Telephone Encounter (Signed)
Billy Cummings from [facility] calling back. She states the Vascepa is too expensive for the patient and they did not realize it. They are now requesting the omega 3 acid ethyl esters 1 g, because it should only be $8. [phone]

## 2021-02-06 NOTE — Telephone Encounter (Signed)
Called pt to let him know the new prescription was sent into [facility] and it should be ready today, He verbalized understanding and thanked me for getting back to him. No questions expressed at this time.

## 2021-02-06 NOTE — Telephone Encounter (Signed)
LVM

## 2021-02-07 ENCOUNTER — Telehealth: Payer: Self-pay | Admitting: Cardiology

## 2021-02-07 ENCOUNTER — Other Ambulatory Visit: Payer: Self-pay

## 2021-02-07 MED ORDER — OMEGA-3-ACID ETHYL ESTERS 1 G PO CAPS: 1.0000 g | ORAL_CAPSULE | Freq: Two times a day (BID) | ORAL | 3 refills | Status: DC

## 2021-02-07 NOTE — Telephone Encounter (Signed)
Pharmacy called wanting to know if icosapent Ethyl (VASCEPA) 1 g capsule can be switch to Omega 3. Since the copay for Vascepa is $50 and Omega 3 is $8 a month.  Patient has been on Omega 3 in the past.

## 2021-02-07 NOTE — Progress Notes (Signed)
Cardiology Office Note   Date:  02/07/2021   ID:  Billy Cummings, DOB 25-Aug-1970, MRN [id number]  PCP:  Pc, [facility]  Cardiologist:  Dr. Berniece Salines CC: Hospital follow up   Patient was a NO Show!  Billy Cummings, ANP, [facility]   02/07/2021 11:33 AM    [facility] [place] [address] Office [phone] Fax [phone]  Notice: This dictation was prepared with Dragon dictation along with smaller phrase technology. Any transcriptional errors that result from this process are unintentional and may not be corrected upon review.

## 2021-02-07 NOTE — Telephone Encounter (Signed)
Prescription sent to pharmacy.

## 2021-02-07 NOTE — Telephone Encounter (Signed)
Message routed to Dr. Mauro Kaufmann RN

## 2021-02-07 NOTE — Telephone Encounter (Signed)
It will be fine

## 2021-02-08 ENCOUNTER — Ambulatory Visit: Payer: No Typology Code available for payment source | Admitting: Adult Health

## 2021-02-11 ENCOUNTER — Telehealth (HOSPITAL_COMMUNITY): Payer: Self-pay | Admitting: Pharmacist

## 2021-02-11 NOTE — Telephone Encounter (Signed)
Third and final attempt.

## 2021-03-06 ENCOUNTER — Ambulatory Visit: Payer: No Typology Code available for payment source | Admitting: Cardiology

## 2021-05-09 ENCOUNTER — Other Ambulatory Visit: Payer: Self-pay | Admitting: Cardiovascular Disease

## 2021-12-24 ENCOUNTER — Encounter: Payer: Self-pay | Admitting: Nurse Practitioner

## 2021-12-24 ENCOUNTER — Ambulatory Visit: Payer: 59 | Attending: Nurse Practitioner | Admitting: Nurse Practitioner

## 2021-12-24 VITALS — BP 124/88 | HR 82 | Ht 74.0 in | Wt 238.0 lb

## 2021-12-24 DIAGNOSIS — E781 Pure hyperglyceridemia: Secondary | ICD-10-CM | POA: Diagnosis not present

## 2021-12-24 DIAGNOSIS — E785 Hyperlipidemia, unspecified: Secondary | ICD-10-CM

## 2021-12-24 DIAGNOSIS — R600 Localized edema: Secondary | ICD-10-CM

## 2021-12-24 DIAGNOSIS — I10 Essential (primary) hypertension: Secondary | ICD-10-CM | POA: Diagnosis not present

## 2021-12-24 DIAGNOSIS — R5382 Chronic fatigue, unspecified: Secondary | ICD-10-CM

## 2021-12-24 DIAGNOSIS — I2510 Atherosclerotic heart disease of native coronary artery without angina pectoris: Secondary | ICD-10-CM

## 2021-12-24 DIAGNOSIS — R0609 Other forms of dyspnea: Secondary | ICD-10-CM | POA: Diagnosis not present

## 2021-12-24 DIAGNOSIS — Z720 Tobacco use: Secondary | ICD-10-CM

## 2021-12-24 LAB — CBC

## 2021-12-24 MED ORDER — SODIUM CHLORIDE 0.9% FLUSH: 3.0000 mL | Freq: Two times a day (BID) | INTRAVENOUS | Status: AC

## 2021-12-24 MED ORDER — FUROSEMIDE 20 MG PO TABS: 20.0000 mg | ORAL_TABLET | Freq: Every day | ORAL | 3 refills | Status: DC

## 2021-12-24 NOTE — Patient Instructions (Addendum)
Medication Instructions:  STOP HYDROCHLOROTHIAZIDE START LASIX 20 MG DAILY  *If you need a refill on your cardiac medications before your next appointment, please call your pharmacy*   Lab Work: Your physician recommends that you complete labs today.  CBC BMET   If you have labs (blood work) drawn today and your tests are completely normal, you will receive your results only by: [place] (if you have MyChart) OR A paper copy in the mail If you have any lab test that is abnormal or we need to change your treatment, we will call you to review the results.   Testing/Procedures: Your physician has requested that you have a cardiac catheterization. Cardiac catheterization is used to diagnose and/or treat various heart conditions. Doctors may recommend this procedure for a number of different reasons. The most common reason is to evaluate chest pain. Chest pain can be a symptom of coronary artery disease (CAD), and cardiac catheterization can show whether plaque is narrowing or blocking your heart's arteries. This procedure is also used to evaluate the valves, as well as measure the blood flow and oxygen levels in different parts of your heart. For further information please visit [email or web address].[email or web address]. Please follow instruction sheet, as given.    [place] A DEPT OF [facility] A DEPT OF [place]. [facility] [place] [id number] [place] [address] Dept: [phone] Loc: [place]  12/24/2021  You are scheduled for a Cardiac Catheterization on Thursday, September 7 with Dr. Lauree Chandler.  1. Please arrive at the [facility] (Main Entrance A) at [facility]: [address] [place],  [address] at 12:00 PM (This time is two hours before your procedure to ensure your preparation). Free valet parking service is available.   Special note: Every effort is  made to have your procedure done on time. Please understand that emergencies sometimes delay scheduled procedures.  2. Diet: Do not eat solid foods after midnight.  The patient may have clear liquids until 5am upon the day of the procedure.  3. Labs: You will need to have blood drawn on Tuesday, September 5 at [place]  Open: 8am - 5pm (Lunch 12:30 - 1:30)   Phone: [phone]. You do not need to be fasting.  4. Medication instructions in preparation for your procedure:   Contrast Allergy: No    Current Outpatient Medications (Cardiovascular):    fenofibrate 160 MG tablet, Take 160 mg by mouth at bedtime.   hydrochlorothiazide (HYDRODIURIL) 12.5 MG tablet, Take 12.5 mg by mouth daily as needed (leg swelling).   nitroGLYCERIN (NITROSTAT) 0.4 MG SL tablet, Place 0.4 mg under the tongue every 5 (five) minutes as needed for chest pain. As directed   omega-3 acid ethyl esters (LOVAZA) 1 g capsule, Take 1 capsule (1 g total) by mouth 2 (two) times daily.   ranolazine (RANEXA) 500 MG 12 hr tablet, Take 1 tablet (500 mg total) by mouth 2 (two) times daily.   rosuvastatin (CRESTOR) 40 MG tablet, Take 40 mg by mouth at bedtime.  Current Outpatient Medications (Respiratory):    albuterol (VENTOLIN HFA) 108 (90 Base) MCG/ACT inhaler, Inhale 1 puff into the lungs every 4 (four) hours as needed for wheezing or shortness of breath.   fluticasone (FLOVENT HFA) 44 MCG/ACT inhaler, Inhale 2 puffs into the lungs in the morning and at bedtime.  Current Outpatient Medications (Analgesics):  aspirin EC 81 MG tablet, Take 81 mg by mouth at bedtime. Swallow whole.  Current Outpatient Medications (Hematological):    clopidogrel (PLAVIX) 75 MG tablet, TAKE ONE TABLET BY MOUTH DAILY AT 6 AM   folic acid (FOLVITE) 1 MG tablet, Take 1 mg by mouth at bedtime.  Current Outpatient Medications (Other):    busPIRone (BUSPAR) 5 MG tablet, Take 5 mg by mouth 2 (two) times  daily as needed for anxiety.   citalopram (CELEXA) 20 MG tablet, Take 40 mg by mouth at bedtime.   diclofenac Sodium (VOLTAREN) 1 % GEL, Apply 1 application  topically 2 (two) times daily as needed (pain).   hydrOXYzine (ATARAX/VISTARIL) 50 MG tablet, Take 50 mg by mouth every 6 (six) hours as needed for anxiety.   meclizine (ANTIVERT) 12.5 MG tablet, Take 12.5 mg by mouth 3 (three) times daily as needed for dizziness. *For reference purposes while preparing patient instructions.   Delete this med list prior to printing instructions for patient.*    HOLD LASIX 20 MG THE MORNING OF PROCEDURE.   On the morning of your procedure, take your Aspirin and any morning medicines NOT listed above.  You may use sips of water.  5. Plan for one night stay--bring personal belongings. 6. Bring a current list of your medications and current insurance cards. 7. You MUST have a responsible person to drive you home. 8. Someone MUST be with you the first 24 hours after you arrive home or your discharge will be delayed. 9. Please wear clothes that are easy to get on and off and wear slip-on shoes.  Thank you for allowing [place] to care for you!   -- [facility]   Follow-Up: At [facility], you and your health needs are our priority.  As part of our continuing mission to provide you with exceptional heart care, we have created designated Provider Care Teams.  These Care Teams include your primary Cardiologist (physician) and Advanced Practice Providers (APPs -  Physician Assistants and Nurse Practitioners) who all work together to provide you with the care you need, when you need it.  We recommend signing up for the patient portal called "MyChart".  Sign up information is provided on this After Visit Summary.  MyChart is used to connect with patients for Virtual Visits (Telemedicine).  Patients are able to view lab/test results, encounter notes, upcoming appointments, etc.   Non-urgent messages can be sent to your provider as well.   To learn more about what you can do with MyChart, go to [email or web address].    Your next appointment:   3 week(s)  The format for your next appointment:   In Person  Provider:   Diona Browner, NP        Other Instructions   Important Information About Sugar

## 2021-12-24 NOTE — Progress Notes (Signed)
Office Visit    Patient Name: Billy Cummings Date of Encounter: 12/24/2021  Primary Care Provider:  Pc, [facility] Primary Cardiologist:  Berniece Salines, DO  Chief Complaint    51 year old male with a history of CAD s/p DES-RCA in 2012, [place] x2 in 2017, and DES-RCA in 01/2021, hypertension, hyperlipidemia, hypertriglyceridemia, and tobacco use who presents for follow-up related to CAD.  Past Medical History    Past Medical History:  Diagnosis Date   Anxiety    Arthritis    Cigarette smoker 08/29/2015   Coronary artery disease involving native coronary artery of native heart without angina pectoris 08/29/2015   Overview:  STEMI -late restenosis ,inferior wall--stenting of RCA x 2 08/15/15 and old PCI and stent of RCA in 2012   Depression    Dyslipidemia 08/29/2015   Essential hypertension 02/23/2020   History of myocardial infarction    History of TIA (transient ischemic attack) 2021   Hypertriglyceridemia 05/11/2017   Leukocytosis 07/17/2020   OSA (obstructive sleep apnea) 06/28/2020   Severe, AHI 40/hour-210 pounds   Restless leg syndrome    Past Surgical History:  Procedure Laterality Date   CORONARY ANGIOPLASTY WITH STENT PLACEMENT Left 08/15/2015   CORONARY STENT INTERVENTION N/A 01/23/2021   Procedure: CORONARY STENT INTERVENTION;  Surgeon: Sherren Mocha, MD;  Location: [place] [facility];  Service: Cardiovascular;  Laterality: N/A;   LEFT HEART CATH AND CORONARY ANGIOGRAPHY N/A 01/23/2021   Procedure: LEFT HEART CATH AND CORONARY ANGIOGRAPHY;  Surgeon: Sherren Mocha, MD;  Location: [place] [facility];  Service: Cardiovascular;  Laterality: N/A;    Allergies  No Known Allergies  History of Present Illness    51 year old male with the above past medical history including CAD s/p DES-RCA in 2012, DES-RCA x2 in 2017, and DES-RCA in 01/2021, hypertension, hyperlipidemia, hypertriglyceridemia, and tobacco use.  He has a history of CAD with stenting to  the RCA in 2012, followed by DES x2-RCA in 2017, previously followed by Dr. Geraldo Pitter.  He was last seen in the office on 01/15/2021 and reported intermittent chest discomfort which he described as a midsternal pressure and occasional burning sensation which radiated to his neck, associated shortness of breath.  He was started on Imdur and Vascepa.  He underwent cardiac catheterization on 01/23/2021 which revealed 90% mRCA lesion (ISR) s/p DES, otherwise nonobstructive CAD.  DAPT with aspirin and Plavix was recommended for 6 months without interruption, followed by possible long-term Plavix therapy given history of multiple PCI procedures and recurrent in-stent restenosis.  He was discharged home in stable condition on the same day of his procedure.  He has not been seen in follow-up since.  He presents today for follow-up.  Since his procedure he has been stable overall from a cardiac standpoint.  He initially reported a great improvement in his symptoms following his cath, however, over the past 6 months he has noticed increased fatigue, dyspnea on exertion, lower extremity edema, and orthopnea.  Additionally, he has noted chest pain at rest.  He states his symptoms are similar to his prior anginal equivalent.  He continues to smoke.  Otherwise, he denies any additional concerns today.  Home Medications    Current Outpatient Medications  Medication Sig Dispense Refill   albuterol (VENTOLIN HFA) 108 (90 Base) MCG/ACT inhaler Inhale 1 puff into the lungs every 4 (four) hours as needed for wheezing or shortness of breath.     aspirin EC 81 MG tablet Take 81 mg by mouth at bedtime.  Swallow whole.     busPIRone (BUSPAR) 5 MG tablet Take 5 mg by mouth 2 (two) times daily as needed for anxiety.     citalopram (CELEXA) 20 MG tablet Take 40 mg by mouth at bedtime.     clopidogrel (PLAVIX) 75 MG tablet TAKE ONE TABLET BY MOUTH DAILY AT 6 AM 30 tablet 0   diclofenac Sodium (VOLTAREN) 1 % GEL Apply 1 application   topically 2 (two) times daily as needed (pain).     fenofibrate 160 MG tablet Take 160 mg by mouth at bedtime.     fluticasone (FLOVENT HFA) 44 MCG/ACT inhaler Inhale 2 puffs into the lungs in the morning and at bedtime.     folic acid (FOLVITE) 1 MG tablet Take 1 mg by mouth at bedtime.     furosemide (LASIX) 20 MG tablet Take 1 tablet (20 mg total) by mouth daily. 90 tablet 3   hydrOXYzine (ATARAX/VISTARIL) 50 MG tablet Take 50 mg by mouth every 6 (six) hours as needed for anxiety.     meclizine (ANTIVERT) 12.5 MG tablet Take 12.5 mg by mouth 3 (three) times daily as needed for dizziness.     nitroGLYCERIN (NITROSTAT) 0.4 MG SL tablet Place 0.4 mg under the tongue every 5 (five) minutes as needed for chest pain. As directed     omega-3 acid ethyl esters (LOVAZA) 1 g capsule Take 1 capsule (1 g total) by mouth 2 (two) times daily. 180 capsule 3   ranolazine (RANEXA) 500 MG 12 hr tablet Take 1 tablet (500 mg total) by mouth 2 (two) times daily. 180 tablet 3   rosuvastatin (CRESTOR) 40 MG tablet Take 40 mg by mouth at bedtime.     Current Facility-Administered Medications  Medication Dose Route Frequency Provider Last Rate Last Admin   sodium chloride flush (NS) 0.9 % injection 3 mL  3 mL Intravenous Q12H Shantara Goosby C, NP         Review of Systems    He denies palpitations, , pnd, n, v, dizziness, syncope, weight gain, or early satiety. All other systems reviewed and are otherwise negative except as noted above.   Physical Exam    VS:  BP 124/88   Pulse 82   Ht '6\' 2"'$  (1.88 m)   Wt 238 lb (108 kg)   SpO2 94%   BMI 30.56 kg/m  GEN: Well nourished, well developed, in no acute distress. HEENT: normal. Neck: Supple, no JVD, carotid bruits, or masses. Cardiac: RRR, no murmurs, rubs, or gallops. No clubbing, cyanosis, edema.  Radials/DP/PT 2+ and equal bilaterally.  Respiratory:  Respirations regular and unlabored, clear to auscultation bilaterally. GI: Soft, nontender, nondistended, BS  + x 4. MS: no deformity or atrophy. Skin: warm and dry, no rash. Neuro:  Strength and sensation are intact. Psych: Normal affect.  Accessory Clinical Findings    ECG personally reviewed by me today -NSR, 82 bpm- no acute changes.   Lab Results  Component Value Date   WBC 10.2 11/16/2020   HGB 17.0 11/16/2020   HCT 49 11/16/2020   PLT 314 11/16/2020   Lab Results  Component Value Date   CREATININE 1.1 11/16/2020   BUN 13 11/16/2020   NA 138 11/16/2020   K 3.9 11/16/2020   CL 104 11/16/2020   CO2 25 (A) 11/16/2020   Lab Results  Component Value Date   ALT 60 (A) 11/16/2020   AST 44 (A) 11/16/2020   ALKPHOS 110 11/16/2020   No results found  for: "CHOL", "HDL", "LDLCALC", "LDLDIRECT", "TRIG", "CHOLHDL"  No results found for: "HGBA1C"  Assessment & Plan    1. CAD: S/p stenting to the RCA in 2012, followed by DES x2-RCA in 2017 and DES-mRCA in 01/2021 (ISR).  He notes a 32-monthhistory of increased fatigue, dyspnea on exertion, chest pain, and bilateral lower extremity edema.  His symptoms are similar to prior anginal equivalent.  Discussed with Dr. THarriet Masson primary cardiologist.  Given history of repeated ISR, anginal equivalent symptoms, lower extremity edema, orthopnea, and ongoing tobacco use, will pursue R/LHC.  Cath has been scheduled with Dr. MAngelena Formon 12/26/2021 at 12 PM.  Will check CBC, BMET today.  Continue aspirin, Plavix, Ranexa, fenofibrate, Lovaza, and Crestor.  Will start Lasix as below.  Shared Decision Making/Informed Consent The risks [stroke (1 in 1000), death (1 in 1000), kidney failure [usually temporary] (1 in 500), bleeding (1 in 200), allergic reaction [possibly serious] (1 in 200)], benefits (diagnostic support and management of coronary artery disease) and alternatives of a cardiac catheterization were discussed in detail with Mr. CPheasantand he is willing to proceed.  2. Bilateral lower extremity edema: Echo in 12/2020 showed EF 50 to 55%, centric LVH, G1 DD,  normal RV systolic function, no significant valvular abnormalities.  He notes a 4 to 592-monthistory of bilateral lower extremity edema, orthopnea.  Generally euvolemic and well compensated on exam today.  R/LHC pending as above.  Will start Lasix 20 mg daily with plans for close follow-up.   3. Hypertension: BP well controlled. Continue current antihypertensive regimen.   4. Hyperlipidemia: No recent LDL on file.  He states he recently had labs drawn through his PCP.  Will request labs.  For now, continue aspirin, fenofibrate, Lovaza, and Crestor.  Will repeat lipids, LFTs.  Continue aspirin, Crestor.  5. Hypertriglyceridemia: Continue current medications as above.  6. Tobacco use: He continues to smoke.  We discussed the long-term effects of this in relation to his coronary artery disease and repeated ISR.  Full cessation advised.  7. Disposition: Follow-up 2 weeks post-cath.       EmLenna SciaraNP 12/24/2021, 10:17 AM

## 2021-12-24 NOTE — H&P (View-Only) (Signed)
Office Visit    Patient Name: Billy Cummings Date of Encounter: 12/24/2021  Primary Care Provider:  Pc, [facility] Primary Cardiologist:  Berniece Salines, DO  Chief Complaint    51 year old male with a history of CAD s/p DES-RCA in 2012, [place] x2 in 2017, and DES-RCA in 01/2021, hypertension, hyperlipidemia, hypertriglyceridemia, and tobacco use who presents for follow-up related to CAD.  Past Medical History    Past Medical History:  Diagnosis Date   Anxiety    Arthritis    Cigarette smoker 08/29/2015   Coronary artery disease involving native coronary artery of native heart without angina pectoris 08/29/2015   Overview:  STEMI -late restenosis ,inferior wall--stenting of RCA x 2 08/15/15 and old PCI and stent of RCA in 2012   Depression    Dyslipidemia 08/29/2015   Essential hypertension 02/23/2020   History of myocardial infarction    History of TIA (transient ischemic attack) 2021   Hypertriglyceridemia 05/11/2017   Leukocytosis 07/17/2020   OSA (obstructive sleep apnea) 06/28/2020   Severe, AHI 40/hour-210 pounds   Restless leg syndrome    Past Surgical History:  Procedure Laterality Date   CORONARY ANGIOPLASTY WITH STENT PLACEMENT Left 08/15/2015   CORONARY STENT INTERVENTION N/A 01/23/2021   Procedure: CORONARY STENT INTERVENTION;  Surgeon: Sherren Mocha, MD;  Location: [place] [facility];  Service: Cardiovascular;  Laterality: N/A;   LEFT HEART CATH AND CORONARY ANGIOGRAPHY N/A 01/23/2021   Procedure: LEFT HEART CATH AND CORONARY ANGIOGRAPHY;  Surgeon: Sherren Mocha, MD;  Location: [place] [facility];  Service: Cardiovascular;  Laterality: N/A;    Allergies  No Known Allergies  History of Present Illness    51 year old male with the above past medical history including CAD s/p DES-RCA in 2012, DES-RCA x2 in 2017, and DES-RCA in 01/2021, hypertension, hyperlipidemia, hypertriglyceridemia, and tobacco use.  He has a history of CAD with stenting to  the RCA in 2012, followed by DES x2-RCA in 2017, previously followed by Dr. Geraldo Pitter.  He was last seen in the office on 01/15/2021 and reported intermittent chest discomfort which he described as a midsternal pressure and occasional burning sensation which radiated to his neck, associated shortness of breath.  He was started on Imdur and Vascepa.  He underwent cardiac catheterization on 01/23/2021 which revealed 90% mRCA lesion (ISR) s/p DES, otherwise nonobstructive CAD.  DAPT with aspirin and Plavix was recommended for 6 months without interruption, followed by possible long-term Plavix therapy given history of multiple PCI procedures and recurrent in-stent restenosis.  He was discharged home in stable condition on the same day of his procedure.  He has not been seen in follow-up since.  He presents today for follow-up.  Since his procedure he has been stable overall from a cardiac standpoint.  He initially reported a great improvement in his symptoms following his cath, however, over the past 6 months he has noticed increased fatigue, dyspnea on exertion, lower extremity edema, and orthopnea.  Additionally, he has noted chest pain at rest.  He states his symptoms are similar to his prior anginal equivalent.  He continues to smoke.  Otherwise, he denies any additional concerns today.  Home Medications    Current Outpatient Medications  Medication Sig Dispense Refill   albuterol (VENTOLIN HFA) 108 (90 Base) MCG/ACT inhaler Inhale 1 puff into the lungs every 4 (four) hours as needed for wheezing or shortness of breath.     aspirin EC 81 MG tablet Take 81 mg by mouth at bedtime.  Swallow whole.     busPIRone (BUSPAR) 5 MG tablet Take 5 mg by mouth 2 (two) times daily as needed for anxiety.     citalopram (CELEXA) 20 MG tablet Take 40 mg by mouth at bedtime.     clopidogrel (PLAVIX) 75 MG tablet TAKE ONE TABLET BY MOUTH DAILY AT 6 AM 30 tablet 0   diclofenac Sodium (VOLTAREN) 1 % GEL Apply 1 application   topically 2 (two) times daily as needed (pain).     fenofibrate 160 MG tablet Take 160 mg by mouth at bedtime.     fluticasone (FLOVENT HFA) 44 MCG/ACT inhaler Inhale 2 puffs into the lungs in the morning and at bedtime.     folic acid (FOLVITE) 1 MG tablet Take 1 mg by mouth at bedtime.     furosemide (LASIX) 20 MG tablet Take 1 tablet (20 mg total) by mouth daily. 90 tablet 3   hydrOXYzine (ATARAX/VISTARIL) 50 MG tablet Take 50 mg by mouth every 6 (six) hours as needed for anxiety.     meclizine (ANTIVERT) 12.5 MG tablet Take 12.5 mg by mouth 3 (three) times daily as needed for dizziness.     nitroGLYCERIN (NITROSTAT) 0.4 MG SL tablet Place 0.4 mg under the tongue every 5 (five) minutes as needed for chest pain. As directed     omega-3 acid ethyl esters (LOVAZA) 1 g capsule Take 1 capsule (1 g total) by mouth 2 (two) times daily. 180 capsule 3   ranolazine (RANEXA) 500 MG 12 hr tablet Take 1 tablet (500 mg total) by mouth 2 (two) times daily. 180 tablet 3   rosuvastatin (CRESTOR) 40 MG tablet Take 40 mg by mouth at bedtime.     Current Facility-Administered Medications  Medication Dose Route Frequency Provider Last Rate Last Admin   sodium chloride flush (NS) 0.9 % injection 3 mL  3 mL Intravenous Q12H Hanalei Glace C, NP         Review of Systems    He denies palpitations, , pnd, n, v, dizziness, syncope, weight gain, or early satiety. All other systems reviewed and are otherwise negative except as noted above.   Physical Exam    VS:  BP 124/88   Pulse 82   Ht '6\' 2"'$  (1.88 m)   Wt 238 lb (108 kg)   SpO2 94%   BMI 30.56 kg/m  GEN: Well nourished, well developed, in no acute distress. HEENT: normal. Neck: Supple, no JVD, carotid bruits, or masses. Cardiac: RRR, no murmurs, rubs, or gallops. No clubbing, cyanosis, edema.  Radials/DP/PT 2+ and equal bilaterally.  Respiratory:  Respirations regular and unlabored, clear to auscultation bilaterally. GI: Soft, nontender, nondistended, BS  + x 4. MS: no deformity or atrophy. Skin: warm and dry, no rash. Neuro:  Strength and sensation are intact. Psych: Normal affect.  Accessory Clinical Findings    ECG personally reviewed by me today -NSR, 82 bpm- no acute changes.   Lab Results  Component Value Date   WBC 10.2 11/16/2020   HGB 17.0 11/16/2020   HCT 49 11/16/2020   PLT 314 11/16/2020   Lab Results  Component Value Date   CREATININE 1.1 11/16/2020   BUN 13 11/16/2020   NA 138 11/16/2020   K 3.9 11/16/2020   CL 104 11/16/2020   CO2 25 (A) 11/16/2020   Lab Results  Component Value Date   ALT 60 (A) 11/16/2020   AST 44 (A) 11/16/2020   ALKPHOS 110 11/16/2020   No results found  for: "CHOL", "HDL", "LDLCALC", "LDLDIRECT", "TRIG", "CHOLHDL"  No results found for: "HGBA1C"  Assessment & Plan    1. CAD: S/p stenting to the RCA in 2012, followed by DES x2-RCA in 2017 and DES-mRCA in 01/2021 (ISR).  He notes a 81-monthhistory of increased fatigue, dyspnea on exertion, chest pain, and bilateral lower extremity edema.  His symptoms are similar to prior anginal equivalent.  Discussed with Dr. THarriet Masson primary cardiologist.  Given history of repeated ISR, anginal equivalent symptoms, lower extremity edema, orthopnea, and ongoing tobacco use, will pursue R/LHC.  Cath has been scheduled with Dr. MAngelena Formon 12/26/2021 at 12 PM.  Will check CBC, BMET today.  Continue aspirin, Plavix, Ranexa, fenofibrate, Lovaza, and Crestor.  Will start Lasix as below.  Shared Decision Making/Informed Consent The risks [stroke (1 in 1000), death (1 in 1000), kidney failure [usually temporary] (1 in 500), bleeding (1 in 200), allergic reaction [possibly serious] (1 in 200)], benefits (diagnostic support and management of coronary artery disease) and alternatives of a cardiac catheterization were discussed in detail with Mr. CDougaland he is willing to proceed.  2. Bilateral lower extremity edema: Echo in 12/2020 showed EF 50 to 55%, centric LVH, G1 DD,  normal RV systolic function, no significant valvular abnormalities.  He notes a 4 to 548-monthistory of bilateral lower extremity edema, orthopnea.  Generally euvolemic and well compensated on exam today.  R/LHC pending as above.  Will start Lasix 20 mg daily with plans for close follow-up.   3. Hypertension: BP well controlled. Continue current antihypertensive regimen.   4. Hyperlipidemia: No recent LDL on file.  He states he recently had labs drawn through his PCP.  Will request labs.  For now, continue aspirin, fenofibrate, Lovaza, and Crestor.  Will repeat lipids, LFTs.  Continue aspirin, Crestor.  5. Hypertriglyceridemia: Continue current medications as above.  6. Tobacco use: He continues to smoke.  We discussed the long-term effects of this in relation to his coronary artery disease and repeated ISR.  Full cessation advised.  7. Disposition: Follow-up 2 weeks post-cath.       EmLenna SciaraNP 12/24/2021, 10:17 AM

## 2021-12-25 ENCOUNTER — Encounter: Payer: Self-pay | Admitting: *Deleted

## 2021-12-25 ENCOUNTER — Telehealth: Payer: Self-pay | Admitting: *Deleted

## 2021-12-25 LAB — CBC
Hematocrit: 51.5 % — ABNORMAL HIGH (ref 37.5–51.0)
Hemoglobin: 17.9 g/dL — ABNORMAL HIGH (ref 13.0–17.7)
MCH: 29.9 pg (ref 26.6–33.0)
MCHC: 34.8 g/dL (ref 31.5–35.7)
MCV: 86 fL (ref 79–97)
Platelets: 354 10*3/uL (ref 150–450)
RBC: 5.99 x10E6/uL — ABNORMAL HIGH (ref 4.14–5.80)
RDW: 13 % (ref 11.6–15.4)
WBC: 19.3 10*3/uL — ABNORMAL HIGH (ref 3.4–10.8)

## 2021-12-25 LAB — BASIC METABOLIC PANEL
BUN/Creatinine Ratio: 7 — ABNORMAL LOW (ref 9–20)
BUN: 10 mg/dL (ref 6–24)
CO2: 26 mmol/L (ref 20–29)
Calcium: 10 mg/dL (ref 8.7–10.2)
Chloride: 99 mmol/L (ref 96–106)
Creatinine, Ser: 1.37 mg/dL — ABNORMAL HIGH (ref 0.76–1.27)
Glucose: 69 mg/dL — ABNORMAL LOW (ref 70–99)
Potassium: 4.7 mmol/L (ref 3.5–5.2)
Sodium: 139 mmol/L (ref 134–144)
eGFR: 62 mL/min/{1.73_m2} (ref >59)

## 2021-12-25 NOTE — Telephone Encounter (Signed)
Reviewed procedure instructions with patient.  

## 2021-12-25 NOTE — Telephone Encounter (Signed)
Follow Up:    Patient is calling you back from today.

## 2021-12-25 NOTE — Telephone Encounter (Signed)
Cardiac Catheterization scheduled at [facility] for: Thursday December 26, 2021 12 Noon Arrival time and place: [facility] Main Entrance A at: 10 AM  Nothing to eat after midnight prior to procedure, clear liquids until 5 AM day of procedure.  Medication instructions: -Hold:  Lasix-AM of procedure -Except hold medications usual morning medications can be taken with sips of water including aspirin 81 mg and Plavix 75 mg.  Confirmed patient has responsible adult to drive home post procedure and be with patient first 24 hours after arriving home.  Patient reports no new symptoms concerning for COVID-19 in the past 10 days.  Left message for patient to call back to review procedure instructions.

## 2021-12-26 ENCOUNTER — Other Ambulatory Visit: Payer: Self-pay

## 2021-12-26 ENCOUNTER — Encounter (HOSPITAL_COMMUNITY)
Admission: RE | Disposition: A | Discharge status: Home / Self Care | Payer: Self-pay | Source: Home / Self Care | Attending: Cardiovascular Disease

## 2021-12-26 ENCOUNTER — Ambulatory Visit (HOSPITAL_COMMUNITY)
Admission: RE | Admit: 2021-12-26 | Discharge: 2021-12-26 | Disposition: A | Discharge status: Home / Self Care | Payer: 59 | Attending: Cardiovascular Disease | Admitting: Cardiovascular Disease

## 2021-12-26 DIAGNOSIS — I2510 Atherosclerotic heart disease of native coronary artery without angina pectoris: Secondary | ICD-10-CM

## 2021-12-26 DIAGNOSIS — E781 Pure hyperglyceridemia: Secondary | ICD-10-CM | POA: Insufficient documentation

## 2021-12-26 DIAGNOSIS — Z955 Presence of coronary angioplasty implant and graft: Secondary | ICD-10-CM | POA: Diagnosis not present

## 2021-12-26 DIAGNOSIS — Z7982 Long term (current) use of aspirin: Secondary | ICD-10-CM | POA: Insufficient documentation

## 2021-12-26 DIAGNOSIS — R0609 Other forms of dyspnea: Secondary | ICD-10-CM

## 2021-12-26 DIAGNOSIS — E785 Hyperlipidemia, unspecified: Secondary | ICD-10-CM | POA: Insufficient documentation

## 2021-12-26 DIAGNOSIS — Z720 Tobacco use: Secondary | ICD-10-CM

## 2021-12-26 DIAGNOSIS — F17200 Nicotine dependence, unspecified, uncomplicated: Secondary | ICD-10-CM | POA: Insufficient documentation

## 2021-12-26 DIAGNOSIS — I10 Essential (primary) hypertension: Secondary | ICD-10-CM | POA: Insufficient documentation

## 2021-12-26 DIAGNOSIS — R5382 Chronic fatigue, unspecified: Secondary | ICD-10-CM

## 2021-12-26 DIAGNOSIS — Z79899 Other long term (current) drug therapy: Secondary | ICD-10-CM | POA: Insufficient documentation

## 2021-12-26 DIAGNOSIS — R600 Localized edema: Secondary | ICD-10-CM

## 2021-12-26 HISTORY — PX: RIGHT/LEFT HEART CATH AND CORONARY ANGIOGRAPHY: CATH118266

## 2021-12-26 HISTORY — PX: INTRAVASCULAR PRESSURE WIRE/FFR STUDY: CATH118243

## 2021-12-26 LAB — POCT I-STAT EG7
Acid-base deficit: 2 mmol/L (ref 0.0–2.0)
Bicarbonate: 25.3 mmol/L (ref 20.0–28.0)
Calcium, Ion: 1.03 mmol/L — ABNORMAL LOW (ref 1.15–1.40)
HCT: 40 % (ref 39.0–52.0)
Hemoglobin: 13.6 g/dL (ref 13.0–17.0)
O2 Saturation: 69 %
Potassium: 3.5 mmol/L (ref 3.5–5.1)
Sodium: 145 mmol/L (ref 135–145)
TCO2: 27 mmol/L (ref 22–32)
pCO2, Ven: 50.1 mmHg (ref 44–60)
pH, Ven: 7.312 (ref 7.25–7.43)
pO2, Ven: 40 mmHg (ref 32–45)

## 2021-12-26 LAB — CBC
HCT: 45 % (ref 39.0–52.0)
Hemoglobin: 15.5 g/dL (ref 13.0–17.0)
MCH: 29.6 pg (ref 26.0–34.0)
MCHC: 34.4 g/dL (ref 30.0–36.0)
MCV: 86 fL (ref 80.0–100.0)
Platelets: 294 10*3/uL (ref 150–400)
RBC: 5.23 MIL/uL (ref 4.22–5.81)
RDW: 13.3 % (ref 11.5–15.5)
WBC: 13.9 10*3/uL — ABNORMAL HIGH (ref 4.0–10.5)
nRBC: 0 % (ref 0.0–0.2)

## 2021-12-26 LAB — POCT I-STAT 7, (LYTES, BLD GAS, ICA,H+H)
Acid-Base Excess: 0 mmol/L (ref 0.0–2.0)
Bicarbonate: 26.4 mmol/L (ref 20.0–28.0)
Calcium, Ion: 1.23 mmol/L (ref 1.15–1.40)
HCT: 44 % (ref 39.0–52.0)
Hemoglobin: 15 g/dL (ref 13.0–17.0)
O2 Saturation: 92 %
Potassium: 4.2 mmol/L (ref 3.5–5.1)
Sodium: 141 mmol/L (ref 135–145)
TCO2: 28 mmol/L (ref 22–32)
pCO2 arterial: 47.8 mmHg (ref 32–48)
pH, Arterial: 7.351 (ref 7.35–7.45)
pO2, Arterial: 68 mmHg — ABNORMAL LOW (ref 83–108)

## 2021-12-26 LAB — GLUCOSE, CAPILLARY
Glucose-Capillary: 85 mg/dL (ref 70–99)
Glucose-Capillary: 98 mg/dL (ref 70–99)

## 2021-12-26 LAB — POCT ACTIVATED CLOTTING TIME: Activated Clotting Time: 275 seconds

## 2021-12-26 SURGERY — RIGHT/LEFT HEART CATH AND CORONARY ANGIOGRAPHY
Anesthesia: LOCAL

## 2021-12-26 MED ORDER — SODIUM CHLORIDE 0.9 % IV SOLN: 250.0000 mL | INTRAVENOUS | Status: DC | PRN

## 2021-12-26 MED ORDER — LIDOCAINE HCL (PF) 1 % IJ SOLN
INTRAMUSCULAR | Status: AC
  Filled 2021-12-26: qty 30

## 2021-12-26 MED ORDER — SODIUM CHLORIDE 0.9 % IV SOLN: INTRAVENOUS | Status: AC

## 2021-12-26 MED ORDER — SODIUM CHLORIDE 0.9% FLUSH: 3.0000 mL | INTRAVENOUS | Status: DC | PRN

## 2021-12-26 MED ORDER — ACETAMINOPHEN 325 MG PO TABS: 650.0000 mg | ORAL_TABLET | ORAL | Status: DC | PRN

## 2021-12-26 MED ORDER — MIDAZOLAM HCL 2 MG/2ML IJ SOLN
INTRAMUSCULAR | Status: AC
  Filled 2021-12-26: qty 2

## 2021-12-26 MED ORDER — HEPARIN SODIUM (PORCINE) 1000 UNIT/ML IJ SOLN
INTRAMUSCULAR | Status: DC | PRN
  Administered 2021-12-26 (×2): 5000 [IU] via INTRAVENOUS

## 2021-12-26 MED ORDER — VERAPAMIL HCL 2.5 MG/ML IV SOLN
INTRAVENOUS | Status: AC
  Filled 2021-12-26: qty 2

## 2021-12-26 MED ORDER — HYDRALAZINE HCL 20 MG/ML IJ SOLN: 10.0000 mg | INTRAMUSCULAR | Status: DC | PRN

## 2021-12-26 MED ORDER — ONDANSETRON HCL 4 MG/2ML IJ SOLN: 4.0000 mg | Freq: Four times a day (QID) | INTRAMUSCULAR | Status: DC | PRN

## 2021-12-26 MED ORDER — SODIUM CHLORIDE 0.9 % WEIGHT BASED INFUSION
3.0000 mL/kg/h | INTRAVENOUS | Status: AC
  Administered 2021-12-26: 3 mL/kg/h via INTRAVENOUS

## 2021-12-26 MED ORDER — ASPIRIN 81 MG PO CHEW: 81.0000 mg | CHEWABLE_TABLET | ORAL | Status: DC

## 2021-12-26 MED ORDER — FENTANYL CITRATE (PF) 100 MCG/2ML IJ SOLN
INTRAMUSCULAR | Status: DC | PRN
  Administered 2021-12-26: 50 ug via INTRAVENOUS
  Administered 2021-12-26: 25 ug via INTRAVENOUS

## 2021-12-26 MED ORDER — HEPARIN SODIUM (PORCINE) 1000 UNIT/ML IJ SOLN
INTRAMUSCULAR | Status: AC
  Filled 2021-12-26: qty 10

## 2021-12-26 MED ORDER — LIDOCAINE HCL (PF) 1 % IJ SOLN
INTRAMUSCULAR | Status: DC | PRN
  Administered 2021-12-26 (×2): 2 mL

## 2021-12-26 MED ORDER — IOHEXOL 350 MG/ML SOLN
INTRAVENOUS | Status: DC | PRN
  Administered 2021-12-26: 70 mL

## 2021-12-26 MED ORDER — LABETALOL HCL 5 MG/ML IV SOLN
10.0000 mg | INTRAVENOUS | Status: DC | PRN
  Administered 2021-12-26: 10 mg via INTRAVENOUS
  Filled 2021-12-26: qty 4

## 2021-12-26 MED ORDER — FENTANYL CITRATE (PF) 100 MCG/2ML IJ SOLN
INTRAMUSCULAR | Status: AC
  Filled 2021-12-26: qty 2

## 2021-12-26 MED ORDER — VERAPAMIL HCL 2.5 MG/ML IV SOLN
INTRAVENOUS | Status: DC | PRN
  Administered 2021-12-26: 10 mL via INTRA_ARTERIAL

## 2021-12-26 MED ORDER — HEPARIN (PORCINE) IN NACL 1000-0.9 UT/500ML-% IV SOLN
INTRAVENOUS | Status: DC | PRN
  Administered 2021-12-26 (×2): 500 mL

## 2021-12-26 MED ORDER — SODIUM CHLORIDE 0.9% FLUSH: 3.0000 mL | Freq: Two times a day (BID) | INTRAVENOUS | Status: DC

## 2021-12-26 MED ORDER — SODIUM CHLORIDE 0.9 % WEIGHT BASED INFUSION: 1.0000 mL/kg/h | INTRAVENOUS | Status: DC

## 2021-12-26 MED ORDER — MIDAZOLAM HCL 2 MG/2ML IJ SOLN
INTRAMUSCULAR | Status: DC | PRN
  Administered 2021-12-26: 2 mg via INTRAVENOUS
  Administered 2021-12-26: 1 mg via INTRAVENOUS

## 2021-12-26 SURGICAL SUPPLY — 14 items
CATH BALLN WEDGE 5F 110CM (CATHETERS) IMPLANT
CATH INFINITI 5FR MULTPACK ANG (CATHETERS) IMPLANT
CATH VISTA GUIDE 6FR XB3 (CATHETERS) IMPLANT
DEVICE RAD COMP TR BAND LRG (VASCULAR PRODUCTS) IMPLANT
GLIDESHEATH SLEND SS 6F .021 (SHEATH) IMPLANT
GUIDEWIRE INQWIRE 1.5J.035X260 (WIRE) IMPLANT
GUIDEWIRE PRESSURE X 175 (WIRE) IMPLANT
INQWIRE 1.5J .035X260CM (WIRE) ×1
KIT ESSENTIALS PG (KITS) IMPLANT
KIT HEART LEFT (KITS) ×1 IMPLANT
PACK CARDIAC CATHETERIZATION (CUSTOM PROCEDURE TRAY) ×1 IMPLANT
SHEATH GLIDE SLENDER 4/5FR (SHEATH) IMPLANT
TRANSDUCER W/STOPCOCK (MISCELLANEOUS) ×1 IMPLANT
TUBING CIL FLEX 10 FLL-RA (TUBING) ×1 IMPLANT

## 2021-12-26 NOTE — Interval H&P Note (Signed)
History and Physical Interval Note:  12/26/2021 11:37 AM  Billy Cummings  has presented today for surgery, with the diagnosis of cad.  The various methods of treatment have been discussed with the patient and family. After consideration of risks, benefits and other options for treatment, the patient has consented to  Procedure(s): RIGHT/LEFT HEART CATH AND CORONARY ANGIOGRAPHY (N/A) as a surgical intervention.  The patient's history has been reviewed, patient examined, no change in status, stable for surgery.  I have reviewed the patient's chart and labs.  Questions were answered to the patient's satisfaction.    Cath Lab Visit (complete for each Cath Lab visit)  Clinical Evaluation Leading to the Procedure:   ACS: No.  Non-ACS:    Anginal Classification: CCS III  Anti-ischemic medical therapy: Minimal Therapy (1 class of medications)  Non-Invasive Test Results: No non-invasive testing performed  Prior CABG: No previous CABG        Lauree Chandler

## 2021-12-27 ENCOUNTER — Encounter (HOSPITAL_COMMUNITY): Payer: Self-pay | Admitting: Cardiovascular Disease

## 2022-01-01 ENCOUNTER — Telehealth: Payer: Self-pay

## 2022-01-01 NOTE — Telephone Encounter (Signed)
Lmom to discuss lab results. Waiting on a return call.  

## 2022-01-08 ENCOUNTER — Telehealth: Payer: Self-pay

## 2022-01-08 NOTE — Telephone Encounter (Signed)
Spoke with pt. Pt was notified of lab results. Pt will continue his current medications and follow up as planned.  

## 2022-01-10 ENCOUNTER — Ambulatory Visit: Payer: 59 | Attending: Nurse Practitioner | Admitting: Nurse Practitioner

## 2022-01-10 ENCOUNTER — Encounter: Payer: Self-pay | Admitting: Nurse Practitioner

## 2022-01-10 VITALS — BP 128/89 | HR 77 | Ht 74.0 in | Wt 243.2 lb

## 2022-01-10 DIAGNOSIS — Z720 Tobacco use: Secondary | ICD-10-CM

## 2022-01-10 DIAGNOSIS — I25118 Atherosclerotic heart disease of native coronary artery with other forms of angina pectoris: Secondary | ICD-10-CM

## 2022-01-10 DIAGNOSIS — R600 Localized edema: Secondary | ICD-10-CM | POA: Diagnosis not present

## 2022-01-10 DIAGNOSIS — I10 Essential (primary) hypertension: Secondary | ICD-10-CM

## 2022-01-10 DIAGNOSIS — E785 Hyperlipidemia, unspecified: Secondary | ICD-10-CM | POA: Diagnosis not present

## 2022-01-10 DIAGNOSIS — E781 Pure hyperglyceridemia: Secondary | ICD-10-CM

## 2022-01-10 NOTE — Progress Notes (Signed)
Office Visit    Patient Name: Billy Cummings Date of Encounter: 01/10/2022  Primary Care Provider:  Pc, [facility] Primary Cardiologist:  Berniece Salines, DO  Chief Complaint    51 year old male with a history of CAD s/p DES-RCA in 2012, [place] x2 in 2017, and DES-RCA in 01/2021, hypertension, hyperlipidemia, hypertriglyceridemia, and tobacco use who presents for follow-up related to CAD.  Past Medical History    Past Medical History:  Diagnosis Date   Anxiety    Arthritis    Cigarette smoker 08/29/2015   Coronary artery disease involving native coronary artery of native heart without angina pectoris 08/29/2015   Overview:  STEMI -late restenosis ,inferior wall--stenting of RCA x 2 08/15/15 and old PCI and stent of RCA in 2012   Depression    Dyslipidemia 08/29/2015   Essential hypertension 02/23/2020   History of myocardial infarction    History of TIA (transient ischemic attack) 2021   Hypertriglyceridemia 05/11/2017   Leukocytosis 07/17/2020   OSA (obstructive sleep apnea) 06/28/2020   Severe, AHI 40/hour-210 pounds   Restless leg syndrome    Past Surgical History:  Procedure Laterality Date   CORONARY ANGIOPLASTY WITH STENT PLACEMENT Left 08/15/2015   CORONARY STENT INTERVENTION N/A 01/23/2021   Procedure: CORONARY STENT INTERVENTION;  Surgeon: Sherren Mocha, MD;  Location: [place] [facility];  Service: Cardiovascular;  Laterality: N/A;   INTRAVASCULAR PRESSURE WIRE/FFR STUDY N/A 12/26/2021   Procedure: INTRAVASCULAR PRESSURE WIRE/FFR STUDY;  Surgeon: Burnell Blanks, MD;  Location: [place] [facility];  Service: Cardiovascular;  Laterality: N/A;   LEFT HEART CATH AND CORONARY ANGIOGRAPHY N/A 01/23/2021   Procedure: LEFT HEART CATH AND CORONARY ANGIOGRAPHY;  Surgeon: Sherren Mocha, MD;  Location: [place] [facility];  Service: Cardiovascular;  Laterality: N/A;   RIGHT/LEFT HEART CATH AND CORONARY ANGIOGRAPHY N/A 12/26/2021   Procedure: RIGHT/LEFT HEART CATH  AND CORONARY ANGIOGRAPHY;  Surgeon: Burnell Blanks, MD;  Location: [place] [facility];  Service: Cardiovascular;  Laterality: N/A;    Allergies  No Known Allergies  History of Present Illness    51 year old male with the above past medical history including CAD s/p DES-RCA in 2012, DES-RCA x2 in 2017, and DES-RCA in 01/2021, hypertension, hyperlipidemia, hypertriglyceridemia, and tobacco use.   He has a history of CAD with stenting to the RCA in 2012, followed by DES x2-RCA in 2017, previously followed by Dr. Geraldo Pitter.  At a follow-up visit in 12/2020 he reported intermittent chest discomfort which he described as a midsternal pressure and occasional burning sensation which radiated to his neck, associated shortness of breath.  He was started on Imdur and Vascepa.  He underwent cardiac catheterization on 01/23/2021 which revealed 90% mRCA lesion (ISR) s/p DES, otherwise nonobstructive CAD.  DAPT with aspirin and Plavix was recommended for 6 months without interruption, followed by possible long-term Plavix therapy given history of multiple PCI procedures and recurrent in-stent restenosis.  He was discharged home in stable condition on the same day of his procedure.     He was last seen in the office on 12/24/2021 and reported a 83-monthhistory of increased fatigue, dyspnea on exertion, lower extremity edema, and orthopnea.  He also reported chest pain at rest.  His symptoms were similar to his anginal equivalent.  He was started on Lasix and underwent repeat R/LHC on 12/26/2021 which revealed patent proximal and mid RCA stents with minimal restenosis (20%), mild nonobstructive plaque in the mid LAD (30%), moderate mid circumflex stenosis (50%) with  negative RFR, normal right and left heart pressures.  Ongoing medical management of CAD was recommended.  He presents today for follow-up accompanied by his significant other. Since his procedure he has done well from a cardiac standpoint.  He denies any  recurrent chest pain, dyspnea.  He notes ongoing nonpitting bilateral lower extremity edema, this has improved significantly with addition of Lasix.  He continues to smoke.  Otherwise, he reports feeling well and denies any additional concerns today.  Home Medications    Current Outpatient Medications  Medication Sig Dispense Refill   albuterol (VENTOLIN HFA) 108 (90 Base) MCG/ACT inhaler Inhale 1 puff into the lungs every 4 (four) hours as needed for wheezing or shortness of breath.     aspirin EC 81 MG tablet Take 81 mg by mouth at bedtime. Swallow whole.     busPIRone (BUSPAR) 5 MG tablet Take 5 mg by mouth 2 (two) times daily as needed for anxiety.     citalopram (CELEXA) 20 MG tablet Take 40 mg by mouth at bedtime.     clopidogrel (PLAVIX) 75 MG tablet TAKE ONE TABLET BY MOUTH DAILY AT 6 AM 30 tablet 0   ezetimibe (ZETIA) 10 MG tablet Take 10 mg by mouth daily.     famotidine (PEPCID) 20 MG tablet Take 20 mg by mouth daily.     fenofibrate 160 MG tablet Take 160 mg by mouth at bedtime.     folic acid (FOLVITE) 1 MG tablet Take 1 mg by mouth at bedtime.     furosemide (LASIX) 20 MG tablet Take 1 tablet (20 mg total) by mouth daily. 90 tablet 3   hydrOXYzine (ATARAX/VISTARIL) 50 MG tablet Take 50 mg by mouth every 6 (six) hours as needed for anxiety.     levocetirizine (XYZAL) 5 MG tablet Take 5 mg by mouth every evening.     losartan (COZAAR) 25 MG tablet Take 25 mg by mouth daily.     nitroGLYCERIN (NITROSTAT) 0.4 MG SL tablet Place 0.4 mg under the tongue every 5 (five) minutes as needed for chest pain. As directed     omega-3 acid ethyl esters (LOVAZA) 1 g capsule Take 1 capsule (1 g total) by mouth 2 (two) times daily. 180 capsule 3   ranolazine (RANEXA) 500 MG 12 hr tablet Take 1 tablet (500 mg total) by mouth 2 (two) times daily. 180 tablet 3   rosuvastatin (CRESTOR) 40 MG tablet Take 40 mg by mouth at bedtime.     Current Facility-Administered Medications  Medication Dose Route  Frequency Provider Last Rate Last Admin   sodium chloride flush (NS) 0.9 % injection 3 mL  3 mL Intravenous Q12H Verneda Hollopeter C, NP         Review of Systems    He denies chest pain, palpitations, dyspnea, pnd, orthopnea, n, v, dizziness, syncope, weight gain, or early satiety. All other systems reviewed and are otherwise negative except as noted above.   Physical Exam    VS:  BP 128/89   Pulse 77   Ht '6\' 2"'$  (1.88 m)   Wt 243 lb 3.2 oz (110.3 kg)   SpO2 95%   BMI 31.23 kg/m   GEN: Well nourished, well developed, in no acute distress. HEENT: normal. Neck: Supple, no JVD, carotid bruits, or masses. Cardiac: RRR, no murmurs, rubs, or gallops. No clubbing, cyanosis, edema.  Radials/DP/PT 2+ and equal bilaterally.  L radial cath site without bruising, bleeding, or hematoma. Respiratory:  Respirations regular and unlabored, clear  to auscultation bilaterally. GI: Soft, nontender, nondistended, BS + x 4. MS: no deformity or atrophy. Skin: warm and dry, no rash. Neuro:  Strength and sensation are intact. Psych: Normal affect.  Accessory Clinical Findings    ECG personally reviewed by me today -NSR, 77 bpm- no acute changes.   Lab Results  Component Value Date   WBC 13.9 (H) 12/26/2021   HGB 15.0 12/26/2021   HCT 44.0 12/26/2021   MCV 86.0 12/26/2021   PLT 294 12/26/2021   Lab Results  Component Value Date   CREATININE 1.37 (H) 12/24/2021   BUN 10 12/24/2021   NA 141 12/26/2021   K 4.2 12/26/2021   CL 99 12/24/2021   CO2 26 12/24/2021   Lab Results  Component Value Date   ALT 60 (A) 11/16/2020   AST 44 (A) 11/16/2020   ALKPHOS 110 11/16/2020   No results found for: "CHOL", "HDL", "LDLCALC", "LDLDIRECT", "TRIG", "CHOLHDL"  No results found for: "HGBA1C"  Assessment & Plan    1. CAD: S/p stenting to the RCA in 2012, followed by DES x2-RCA in 2017 and DES-mRCA in 01/2021 (ISR).  At his last OV he noted a 78-monthhistory of increased fatigue, dyspnea on exertion, chest  pain, and bilateral lower extremity edema.  His symptoms are similar to prior anginal equivalent.  R/LHC on 12/26/2021 revealed patent proximal and mid RCA stents with minimal restenosis (20%), mild nonobstructive plaque in the mid LAD (30%), moderate mid circumflex stenosis (50%) with negative RFR, normal right and left heart pressures.  He denies any further chest pain, dyspnea.  Could consider addition of amlodipine in the future.  He did not tolerate Imdur due to headache.  Continue aspirin, Plavix, Ranexa, fenofibrate, Lovaza, and Crestor.    2. Bilateral lower extremity edema: Echo in 12/2020 showed EF 50 to 55%, centric LVH, G1 DD, normal RV systolic function, no significant valvular abnormalities.  Recent R/LHC showed normal pressures. Lower extremity edema has improved with Lasix. He he does note some ongoing edema, otherwise euvolemic and well compensated on exam.  Will check BMET today given new Lasix. I advised him he can take an additional 20 mg of Lasix as needed for swelling, weight gain.   3. Hypertension: BP well controlled. Continue current antihypertensive regimen.    4. Hyperlipidemia: Due for repeat lipids.  He has a physical scheduled with his PCP this coming Monday and will have lab work done at that time.  Continue aspirin, fenofibrate, Lovaza, and Crestor.     5. Hypertriglyceridemia: Continue current medications as above.   6. Tobacco use: He continues to smoke.  We discussed the long-term effects of this in relation to his coronary artery disease and repeated ISR.  Full cessation advised.   7. Disposition: Follow-up in 4 to 6 months.    ELenna Sciara NP 01/10/2022, 2:54 PM

## 2022-01-10 NOTE — Patient Instructions (Addendum)
Medication Instructions:  Continue Lasix 20 mg daily. May take an additional tab on the days of increased weight gain of 3 lbs overnight or 5 lb in 1 week.   *If you need a refill on your cardiac medications before your next appointment, please call your pharmacy*   Lab Work: Your physician recommends that you complete labs today BMET  If you have labs (blood work) drawn today and your tests are completely normal, you will receive your results only by: MyChart Message (if you have MyChart) OR A paper copy in the mail If you have any lab test that is abnormal or we need to change your treatment, we will call you to review the results.   Testing/Procedures: NONE ordered at this time of appointment     Follow-Up: At [facility], you and your health needs are our priority.  As part of our continuing mission to provide you with exceptional heart care, we have created designated Provider Care Teams.  These Care Teams include your primary Cardiologist (physician) and Advanced Practice Providers (APPs -  Physician Assistants and Nurse Practitioners) who all work together to provide you with the care you need, when you need it.  We recommend signing up for the patient portal called "MyChart".  Sign up information is provided on this After Visit Summary.  MyChart is used to connect with patients for Virtual Visits (Telemedicine).  Patients are able to view lab/test results, encounter notes, upcoming appointments, etc.  Non-urgent messages can be sent to your provider as well.   To learn more about what you can do with MyChart, go to [email or web address].    Your next appointment:   4-6 month(s)  The format for your next appointment:   In Person  Provider:   Berniece Salines, DO  or Diona Browner, NP        Other Instructions   Important Information About Sugar

## 2022-01-11 LAB — BASIC METABOLIC PANEL
BUN/Creatinine Ratio: 9 (ref 9–20)
BUN: 12 mg/dL (ref 6–24)
CO2: 22 mmol/L (ref 20–29)
Calcium: 9.5 mg/dL (ref 8.7–10.2)
Chloride: 101 mmol/L (ref 96–106)
Creatinine, Ser: 1.3 mg/dL — ABNORMAL HIGH (ref 0.76–1.27)
Glucose: 80 mg/dL (ref 70–99)
Potassium: 4.5 mmol/L (ref 3.5–5.2)
Sodium: 140 mmol/L (ref 134–144)
eGFR: 67 mL/min/{1.73_m2} (ref >59)

## 2022-01-15 ENCOUNTER — Telehealth: Payer: Self-pay

## 2022-01-15 NOTE — Telephone Encounter (Signed)
Lmom, to discuss lab results. Waiting on a return call.  

## 2022-01-16 NOTE — Telephone Encounter (Signed)
Lmom, waiting on a return call.  

## 2022-01-20 NOTE — Telephone Encounter (Signed)
Pt didn't return call to discuss lab results. Mailing letter to pt.

## 2022-01-29 ENCOUNTER — Other Ambulatory Visit: Payer: Self-pay

## 2022-01-29 MED ORDER — RANOLAZINE ER 500 MG PO TB12: 500.0000 mg | ORAL_TABLET | Freq: Two times a day (BID) | ORAL | 3 refills | Status: DC

## 2022-01-29 MED ORDER — FUROSEMIDE 20 MG PO TABS: 20.0000 mg | ORAL_TABLET | Freq: Every day | ORAL | 3 refills | Status: DC

## 2022-05-09 ENCOUNTER — Ambulatory Visit: Payer: 59 | Attending: Cardiology | Admitting: Cardiology

## 2022-12-23 ENCOUNTER — Encounter (HOSPITAL_COMMUNITY): Payer: Self-pay

## 2022-12-23 ENCOUNTER — Inpatient Hospital Stay (HOSPITAL_COMMUNITY)
Admission: EM | Admit: 2022-12-23 | Discharge: 2022-12-28 | DRG: 392 | Disposition: A | Discharge status: Home / Self Care | Payer: 59 | Attending: Family Medicine | Admitting: Family Medicine

## 2022-12-23 ENCOUNTER — Other Ambulatory Visit: Payer: Self-pay

## 2022-12-23 ENCOUNTER — Emergency Department (HOSPITAL_COMMUNITY): Payer: 59

## 2022-12-23 DIAGNOSIS — Z803 Family history of malignant neoplasm of breast: Secondary | ICD-10-CM

## 2022-12-23 DIAGNOSIS — G4733 Obstructive sleep apnea (adult) (pediatric): Secondary | ICD-10-CM | POA: Diagnosis present

## 2022-12-23 DIAGNOSIS — Z7902 Long term (current) use of antithrombotics/antiplatelets: Secondary | ICD-10-CM

## 2022-12-23 DIAGNOSIS — R911 Solitary pulmonary nodule: Secondary | ICD-10-CM | POA: Diagnosis present

## 2022-12-23 DIAGNOSIS — F32A Depression, unspecified: Secondary | ICD-10-CM | POA: Diagnosis present

## 2022-12-23 DIAGNOSIS — F419 Anxiety disorder, unspecified: Secondary | ICD-10-CM | POA: Diagnosis present

## 2022-12-23 DIAGNOSIS — I10 Essential (primary) hypertension: Secondary | ICD-10-CM | POA: Diagnosis present

## 2022-12-23 DIAGNOSIS — K5792 Diverticulitis of intestine, part unspecified, without perforation or abscess without bleeding: Secondary | ICD-10-CM | POA: Diagnosis present

## 2022-12-23 DIAGNOSIS — Z6830 Body mass index (BMI) 30.0-30.9, adult: Secondary | ICD-10-CM | POA: Diagnosis not present

## 2022-12-23 DIAGNOSIS — R109 Unspecified abdominal pain: Secondary | ICD-10-CM | POA: Diagnosis present

## 2022-12-23 DIAGNOSIS — I2510 Atherosclerotic heart disease of native coronary artery without angina pectoris: Secondary | ICD-10-CM | POA: Diagnosis present

## 2022-12-23 DIAGNOSIS — E781 Pure hyperglyceridemia: Secondary | ICD-10-CM | POA: Diagnosis present

## 2022-12-23 DIAGNOSIS — K5720 Diverticulitis of large intestine with perforation and abscess without bleeding: Secondary | ICD-10-CM | POA: Diagnosis present

## 2022-12-23 DIAGNOSIS — I252 Old myocardial infarction: Secondary | ICD-10-CM | POA: Diagnosis not present

## 2022-12-23 DIAGNOSIS — E669 Obesity, unspecified: Secondary | ICD-10-CM | POA: Diagnosis present

## 2022-12-23 DIAGNOSIS — K760 Fatty (change of) liver, not elsewhere classified: Secondary | ICD-10-CM | POA: Diagnosis present

## 2022-12-23 DIAGNOSIS — Z79899 Other long term (current) drug therapy: Secondary | ICD-10-CM

## 2022-12-23 DIAGNOSIS — Z8673 Personal history of transient ischemic attack (TIA), and cerebral infarction without residual deficits: Secondary | ICD-10-CM | POA: Diagnosis not present

## 2022-12-23 DIAGNOSIS — G2581 Restless legs syndrome: Secondary | ICD-10-CM | POA: Diagnosis present

## 2022-12-23 DIAGNOSIS — Z8052 Family history of malignant neoplasm of bladder: Secondary | ICD-10-CM | POA: Diagnosis not present

## 2022-12-23 DIAGNOSIS — N179 Acute kidney failure, unspecified: Secondary | ICD-10-CM | POA: Diagnosis present

## 2022-12-23 DIAGNOSIS — Z955 Presence of coronary angioplasty implant and graft: Secondary | ICD-10-CM

## 2022-12-23 DIAGNOSIS — I700 Atherosclerosis of aorta: Secondary | ICD-10-CM | POA: Diagnosis present

## 2022-12-23 DIAGNOSIS — E785 Hyperlipidemia, unspecified: Secondary | ICD-10-CM | POA: Diagnosis present

## 2022-12-23 DIAGNOSIS — Z8249 Family history of ischemic heart disease and other diseases of the circulatory system: Secondary | ICD-10-CM

## 2022-12-23 DIAGNOSIS — F17210 Nicotine dependence, cigarettes, uncomplicated: Secondary | ICD-10-CM | POA: Diagnosis present

## 2022-12-23 DIAGNOSIS — K5732 Diverticulitis of large intestine without perforation or abscess without bleeding: Principal | ICD-10-CM

## 2022-12-23 LAB — COMPREHENSIVE METABOLIC PANEL
ALT: 27 U/L (ref 0–44)
AST: 22 U/L (ref 15–41)
Albumin: 3.6 g/dL (ref 3.5–5.0)
Alkaline Phosphatase: 90 U/L (ref 38–126)
Anion gap: 12 (ref 5–15)
BUN: 10 mg/dL (ref 6–20)
CO2: 25 mmol/L (ref 22–32)
Calcium: 9.2 mg/dL (ref 8.9–10.3)
Chloride: 98 mmol/L (ref 98–111)
Creatinine, Ser: 1.24 mg/dL (ref 0.61–1.24)
GFR, Estimated: >60 mL/min (ref >60)
Glucose, Bld: 92 mg/dL (ref 70–99)
Potassium: 4.3 mmol/L (ref 3.5–5.1)
Sodium: 135 mmol/L (ref 135–145)
Total Bilirubin: 0.6 mg/dL (ref 0.3–1.2)
Total Protein: 7.8 g/dL (ref 6.5–8.1)

## 2022-12-23 LAB — CBC WITH DIFFERENTIAL/PLATELET
Abs Immature Granulocytes: 0.06 10*3/uL (ref 0.00–0.07)
Basophils Absolute: 0.1 10*3/uL (ref 0.0–0.1)
Basophils Relative: 1 %
Eosinophils Absolute: 0.3 10*3/uL (ref 0.0–0.5)
Eosinophils Relative: 2 %
HCT: 43.2 % (ref 39.0–52.0)
Hemoglobin: 14.7 g/dL (ref 13.0–17.0)
Immature Granulocytes: 0 %
Lymphocytes Relative: 29 %
Lymphs Abs: 4.3 10*3/uL — ABNORMAL HIGH (ref 0.7–4.0)
MCH: 30 pg (ref 26.0–34.0)
MCHC: 34 g/dL (ref 30.0–36.0)
MCV: 88.2 fL (ref 80.0–100.0)
Monocytes Absolute: 1.5 10*3/uL — ABNORMAL HIGH (ref 0.1–1.0)
Monocytes Relative: 10 %
Neutro Abs: 8.9 10*3/uL — ABNORMAL HIGH (ref 1.7–7.7)
Neutrophils Relative %: 58 %
Platelets: 395 10*3/uL (ref 150–400)
RBC: 4.9 MIL/uL (ref 4.22–5.81)
RDW: 13.3 % (ref 11.5–15.5)
WBC: 15.2 10*3/uL — ABNORMAL HIGH (ref 4.0–10.5)
nRBC: 0 % (ref 0.0–0.2)

## 2022-12-23 LAB — I-STAT CHEM 8, ED
BUN: 11 mg/dL (ref 6–20)
Calcium, Ion: 1.1 mmol/L — ABNORMAL LOW (ref 1.15–1.40)
Chloride: 101 mmol/L (ref 98–111)
Creatinine, Ser: 1.3 mg/dL — ABNORMAL HIGH (ref 0.61–1.24)
Glucose, Bld: 98 mg/dL (ref 70–99)
HCT: 44 % (ref 39.0–52.0)
Hemoglobin: 15 g/dL (ref 13.0–17.0)
Potassium: 4.5 mmol/L (ref 3.5–5.1)
Sodium: 135 mmol/L (ref 135–145)
TCO2: 22 mmol/L (ref 22–32)

## 2022-12-23 LAB — URINALYSIS, ROUTINE W REFLEX MICROSCOPIC
Bilirubin Urine: NEGATIVE
Glucose, UA: NEGATIVE mg/dL
Hgb urine dipstick: NEGATIVE
Ketones, ur: NEGATIVE mg/dL
Leukocytes,Ua: NEGATIVE
Nitrite: NEGATIVE
Protein, ur: NEGATIVE mg/dL
Specific Gravity, Urine: 1.005 (ref 1.005–1.030)
pH: 6 (ref 5.0–8.0)

## 2022-12-23 LAB — LIPASE, BLOOD: Lipase: 25 U/L (ref 11–51)

## 2022-12-23 MED ORDER — NALOXONE HCL 0.4 MG/ML IJ SOLN: 0.4000 mg | INTRAMUSCULAR | Status: DC | PRN

## 2022-12-23 MED ORDER — SODIUM CHLORIDE 0.9 % IV BOLUS
1000.0000 mL | Freq: Once | INTRAVENOUS | Status: AC
  Administered 2022-12-23: 1000 mL via INTRAVENOUS

## 2022-12-23 MED ORDER — IOHEXOL 350 MG/ML SOLN
75.0000 mL | Freq: Once | INTRAVENOUS | Status: AC | PRN
  Administered 2022-12-23: 75 mL via INTRAVENOUS

## 2022-12-23 MED ORDER — PIPERACILLIN-TAZOBACTAM 3.375 G IVPB 30 MIN
3.3750 g | Freq: Once | INTRAVENOUS | Status: AC
  Administered 2022-12-23: 3.375 g via INTRAVENOUS
  Filled 2022-12-23: qty 50

## 2022-12-23 MED ORDER — ACETAMINOPHEN 325 MG PO TABS
650.0000 mg | ORAL_TABLET | Freq: Four times a day (QID) | ORAL | Status: DC | PRN
  Administered 2022-12-24 – 2022-12-27 (×6): 650 mg via ORAL
  Filled 2022-12-23 (×6): qty 2

## 2022-12-23 MED ORDER — PIPERACILLIN-TAZOBACTAM 3.375 G IVPB
3.3750 g | Freq: Three times a day (TID) | INTRAVENOUS | Status: DC
  Administered 2022-12-24 – 2022-12-28 (×13): 3.375 g via INTRAVENOUS
  Filled 2022-12-23 (×13): qty 50

## 2022-12-23 MED ORDER — NICOTINE 21 MG/24HR TD PT24
21.0000 mg | MEDICATED_PATCH | Freq: Every day | TRANSDERMAL | Status: DC
  Administered 2022-12-24 – 2022-12-28 (×5): 21 mg via TRANSDERMAL
  Filled 2022-12-23 (×5): qty 1

## 2022-12-23 MED ORDER — MORPHINE SULFATE (PF) 2 MG/ML IV SOLN
1.0000 mg | INTRAVENOUS | Status: DC | PRN
  Administered 2022-12-23 – 2022-12-28 (×12): 1 mg via INTRAVENOUS
  Filled 2022-12-23 (×12): qty 1

## 2022-12-23 MED ORDER — SODIUM CHLORIDE 0.9 % IV SOLN: INTRAVENOUS | Status: AC

## 2022-12-23 MED ORDER — MORPHINE SULFATE (PF) 4 MG/ML IV SOLN
4.0000 mg | Freq: Once | INTRAVENOUS | Status: AC
  Administered 2022-12-23: 4 mg via INTRAVENOUS
  Filled 2022-12-23: qty 1

## 2022-12-23 MED ORDER — ACETAMINOPHEN 650 MG RE SUPP: 650.0000 mg | Freq: Four times a day (QID) | RECTAL | Status: DC | PRN

## 2022-12-23 NOTE — H&P (Signed)
History and Physical    Jerrian Burges BMW:[id number] DOB: 10-29-1970 DOA: 12/23/2022  PCP: Don Broach, [facility]  Patient coming from: Home  Chief Complaint: Abdominal pain  HPI: Billy Cummings is a 52 y.o. male with medical history significant of CAD status post PCI, hypertension, hyperlipidemia/hypertriglyceridemia, chronic leukocytosis and lymphocytosis, anxiety and depression, history of TIA, OSA on CPAP, RLS, cigarette smoking presented to the ED with a chief complaint of lower abdominal pain.  Vital signs stable.  Labs notable for WBC 15.2, normal lipase and LFTs, UA not suggestive of infection.  CT abdomen pelvis with contrast showing: "IMPRESSION: 1. Sigmoid colonic diverticulosis with 3.6 cm pericolonic collection and adjacent stranding. This likely represents acute diverticulitis with abscess. Follow-up after resolution of the acute process is recommended to exclude underlying neoplasm. 2. Diffuse fatty infiltration of the liver. 3. Mild thickening of bladder wall may indicate reactive inflammation or cystitis. Correlate with urinalysis. 4. Left solid pulmonary nodule measuring 5 mm. Per Fleischner Society Guidelines, no routine follow-up imaging is recommended. These guidelines do not apply to immunocompromised patients and patients with cancer. Follow up in patients with significant comorbidities as clinically warranted. For lung cancer screening, adhere to Lung-RADS guidelines. Reference: Radiology. 2017; 284(1):228-43. 5. Aortic atherosclerosis."  EDP consulted general surgery (Dr. Derrell Lolling).  Patient was given morphine, Zosyn, and 1 L normal saline.  [facility] called to admit.  Patient reports history of diverticulitis multiple times in the past.  States he started having lower abdominal pain 3 weeks ago.  His PCP prescribed a 5-day course of antibiotics which he finished a week ago but still having persistent pain.  Pain was initially in the left lower quadrant but now it  has spread across his lower abdomen.  He is having regular bowel movements and has not noticed any blood in his stool.  No fevers, chills, nausea, or vomiting.  No chest pain or shortness of breath.  No other complaints.  He reports smoking cigarettes for the past 40 years, smokes 2 packs daily.  Review of Systems:  Review of Systems  All other systems reviewed and are negative.   Past Medical History:  Diagnosis Date   Anxiety    Arthritis    Cigarette smoker 08/29/2015   Coronary artery disease involving native coronary artery of native heart without angina pectoris 08/29/2015   Overview:  STEMI -late restenosis ,inferior wall--stenting of RCA x 2 08/15/15 and old PCI and stent of RCA in 2012   Depression    Dyslipidemia 08/29/2015   Essential hypertension 02/23/2020   History of myocardial infarction    History of TIA (transient ischemic attack) 2021   Hypertriglyceridemia 05/11/2017   Leukocytosis 07/17/2020   OSA (obstructive sleep apnea) 06/28/2020   Severe, AHI 40/hour-210 pounds   Restless leg syndrome     Past Surgical History:  Procedure Laterality Date   CORONARY ANGIOPLASTY WITH STENT PLACEMENT Left 08/15/2015   CORONARY PRESSURE/FFR STUDY N/A 12/26/2021   Procedure: INTRAVASCULAR PRESSURE WIRE/FFR STUDY;  Surgeon: Kathleene Hazel, MD;  Location: [facility] INVASIVE CV LAB;  Service: Cardiovascular;  Laterality: N/A;   CORONARY STENT INTERVENTION N/A 01/23/2021   Procedure: CORONARY STENT INTERVENTION;  Surgeon: Tonny Bollman, MD;  Location: [facility] INVASIVE CV LAB;  Service: Cardiovascular;  Laterality: N/A;   LEFT HEART CATH AND CORONARY ANGIOGRAPHY N/A 01/23/2021   Procedure: LEFT HEART CATH AND CORONARY ANGIOGRAPHY;  Surgeon: Tonny Bollman, MD;  Location: [facility] INVASIVE CV LAB;  Service: Cardiovascular;  Laterality: N/A;   RIGHT/LEFT HEART  CATH AND CORONARY ANGIOGRAPHY N/A 12/26/2021   Procedure: RIGHT/LEFT HEART CATH AND CORONARY ANGIOGRAPHY;  Surgeon: Kathleene Hazel,  MD;  Location: [facility] INVASIVE CV LAB;  Service: Cardiovascular;  Laterality: N/A;     reports that he has been smoking cigarettes. He has a 57 pack-year smoking history. He has never used smokeless tobacco. He reports that he does not drink alcohol and does not use drugs.  No Known Allergies  Family History  Problem Relation Age of Onset   Heart attack Mother    Heart disease Mother    Breast cancer Mother    Heart attack Father    Heart disease Father    Heart attack Brother    Heart disease Brother    Bladder Cancer Brother     Prior to Admission medications   Medication Sig Start Date End Date Taking? Authorizing Provider  aspirin EC 81 MG tablet Take 81 mg by mouth at bedtime. Swallow whole.   Yes [provider]  clopidogrel (PLAVIX) 75 MG tablet TAKE ONE TABLET BY MOUTH DAILY AT 6 AM 05/10/21  Yes Tobb, Kardie, DO  albuterol (VENTOLIN HFA) 108 (90 Base) MCG/ACT inhaler Inhale 1 puff into the lungs every 4 (four) hours as needed for wheezing or shortness of breath.    [provider]  busPIRone (BUSPAR) 5 MG tablet Take 5 mg by mouth 2 (two) times daily as needed for anxiety. 01/02/21   [provider]  citalopram (CELEXA) 20 MG tablet Take 40 mg by mouth at bedtime. Patient not taking: Reported on 12/23/2022 01/09/20   [provider]  ezetimibe (ZETIA) 10 MG tablet Take 10 mg by mouth daily.    [provider]  famotidine (PEPCID) 20 MG tablet Take 20 mg by mouth daily.    [provider]  fenofibrate 160 MG tablet Take 160 mg by mouth at bedtime.    [provider]  folic acid (FOLVITE) 1 MG tablet Take 1 mg by mouth at bedtime.    [provider]  furosemide (LASIX) 20 MG tablet Take 1 tablet (20 mg total) by mouth daily. 01/29/22   Tobb, Kardie, DO  hydrOXYzine (ATARAX/VISTARIL) 50 MG tablet Take 50 mg by mouth every 6 (six) hours as needed for anxiety. 12/31/20   [provider]  levocetirizine (XYZAL)  5 MG tablet Take 5 mg by mouth every evening.    [provider]  losartan (COZAAR) 25 MG tablet Take 25 mg by mouth daily.    [provider]  nitroGLYCERIN (NITROSTAT) 0.4 MG SL tablet Place 0.4 mg under the tongue every 5 (five) minutes as needed for chest pain. As directed    [provider]  omega-3 acid ethyl esters (LOVAZA) 1 g capsule Take 1 capsule (1 g total) by mouth 2 (two) times daily. 02/07/21   Tobb, Kardie, DO  ranolazine (RANEXA) 500 MG 12 hr tablet Take 1 tablet (500 mg total) by mouth 2 (two) times daily. 01/29/22   Tobb, Kardie, DO  rosuvastatin (CRESTOR) 40 MG tablet Take 40 mg by mouth at bedtime. 10/18/19   [provider]    Physical Exam: Vitals:   12/23/22 1930 12/23/22 2000 12/23/22 2030 12/23/22 2051  BP: 126/79 123/75 114/79   Pulse: 70 72 75   Resp: 15  15   Temp:    98.6 F (37 C)  TempSrc:    Oral  SpO2: 95% 97% 96%   Weight:      Height:  Physical Exam Vitals reviewed.  Constitutional:      General: He is not in acute distress. HENT:     Head: Normocephalic and atraumatic.  Eyes:     Extraocular Movements: Extraocular movements intact.  Cardiovascular:     Rate and Rhythm: Normal rate and regular rhythm.     Pulses: Normal pulses.  Pulmonary:     Effort: Pulmonary effort is normal. No respiratory distress.     Breath sounds: Normal breath sounds. No wheezing or rales.  Abdominal:     General: Bowel sounds are normal. There is no distension.     Palpations: Abdomen is soft.     Tenderness: There is no abdominal tenderness. There is no guarding or rebound.     Comments: Left lower quadrant and suprapubic region tender palpation  Musculoskeletal:     Cervical back: Normal range of motion.     Right lower leg: No edema.     Left lower leg: No edema.  Skin:    General: Skin is warm and dry.  Neurological:     General: No focal deficit present.     Mental Status: He is alert and oriented to person,  place, and time.     Labs on Admission: I have personally reviewed following labs and imaging studies  CBC: Recent Labs  Lab 12/23/22 1509 12/23/22 1518  WBC 15.2*  --   NEUTROABS 8.9*  --   HGB 14.7 15.0  HCT 43.2 44.0  MCV 88.2  --   PLT 395  --    Basic Metabolic Panel: Recent Labs  Lab 12/23/22 1509 12/23/22 1518  NA 135 135  K 4.3 4.5  CL 98 101  CO2 25  --   GLUCOSE 92 98  BUN 10 11  CREATININE 1.24 1.30*  CALCIUM 9.2  --    GFR: Estimated Creatinine Clearance: 87.3 mL/min (A) (by C-G formula based on SCr of 1.3 mg/dL (H)). Liver Function Tests: Recent Labs  Lab 12/23/22 1509  AST 22  ALT 27  ALKPHOS 90  BILITOT 0.6  PROT 7.8  ALBUMIN 3.6   Recent Labs  Lab 12/23/22 1509  LIPASE 25   No results for input(s): "AMMONIA" in the last 168 hours. Coagulation Profile: No results for input(s): "INR", "PROTIME" in the last 168 hours. Cardiac Enzymes: No results for input(s): "CKTOTAL", "CKMB", "CKMBINDEX", "TROPONINI" in the last 168 hours. BNP (last 3 results) No results for input(s): "PROBNP" in the last 8760 hours. HbA1C: No results for input(s): "HGBA1C" in the last 72 hours. CBG: No results for input(s): "GLUCAP" in the last 168 hours. Lipid Profile: No results for input(s): "CHOL", "HDL", "LDLCALC", "TRIG", "CHOLHDL", "LDLDIRECT" in the last 72 hours. Thyroid Function Tests: No results for input(s): "TSH", "T4TOTAL", "FREET4", "T3FREE", "THYROIDAB" in the last 72 hours. Anemia Panel: No results for input(s): "VITAMINB12", "FOLATE", "FERRITIN", "TIBC", "IRON", "RETICCTPCT" in the last 72 hours. Urine analysis:    Component Value Date/Time   COLORURINE YELLOW 12/23/2022 1507   APPEARANCEUR CLEAR 12/23/2022 1507   LABSPEC 1.005 12/23/2022 1507   PHURINE 6.0 12/23/2022 1507   GLUCOSEU NEGATIVE 12/23/2022 1507   HGBUR NEGATIVE 12/23/2022 1507   BILIRUBINUR NEGATIVE 12/23/2022 1507   KETONESUR NEGATIVE 12/23/2022 1507   PROTEINUR NEGATIVE  12/23/2022 1507   NITRITE NEGATIVE 12/23/2022 1507   LEUKOCYTESUR NEGATIVE 12/23/2022 1507    Radiological Exams on Admission: CT ABDOMEN PELVIS W CONTRAST  Result Date: 12/23/2022 CLINICAL DATA:  Diverticulitis with complication suspected. Left lower quadrant pain.  EXAM: CT ABDOMEN AND PELVIS WITH CONTRAST TECHNIQUE: Multidetector CT imaging of the abdomen and pelvis was performed using the standard protocol following bolus administration of intravenous contrast. RADIATION DOSE REDUCTION: This exam was performed according to the departmental dose-optimization program which includes automated exposure control, adjustment of the mA and/or kV according to patient size and/or use of iterative reconstruction technique. CONTRAST:  75mL OMNIPAQUE IOHEXOL 350 MG/ML SOLN COMPARISON:  11/21/2017 FINDINGS: Lower chest: 5 mm nodule in the left lung base laterally, series 4, image 3. No change since prior study. Long-term stability is consistent with benign etiology. No imaging follow-up is indicated. Hepatobiliary: Diffuse fatty infiltration of the liver. No focal lesions. Gallbladder and bile ducts are normal. Pancreas: Unremarkable. No pancreatic ductal dilatation or surrounding inflammatory changes. Spleen: Normal in size without focal abnormality. Adrenals/Urinary Tract: Adrenal glands are unremarkable. Kidneys are normal, without renal calculi, focal lesion, or hydronephrosis. Bladder wall is mildly thickened diffusely. This may indicate reactive inflammation or cystitis. Correlate with urinalysis. Stomach/Bowel: Stomach, small bowel, and colon are not abnormally distended. Scattered colonic diverticula. There is mild infiltration around the mid sigmoid colon with a focal pericolonic collection measuring 3.7 cm diameter. This likely represents acute diverticulitis with abscess. Follow-up after resolution of the acute process is recommended to exclude underlying neoplasm. Appendix is normal. Vascular/Lymphatic:  Aortic atherosclerosis. No enlarged abdominal or pelvic lymph nodes. Reproductive: Prostate is unremarkable. Other: No free air or free fluid. Abdominal wall musculature appears intact. Musculoskeletal: No acute or significant osseous findings. IMPRESSION: 1. Sigmoid colonic diverticulosis with 3.6 cm pericolonic collection and adjacent stranding. This likely represents acute diverticulitis with abscess. Follow-up after resolution of the acute process is recommended to exclude underlying neoplasm. 2. Diffuse fatty infiltration of the liver. 3. Mild thickening of bladder wall may indicate reactive inflammation or cystitis. Correlate with urinalysis. 4. Left solid pulmonary nodule measuring 5 mm. Per Fleischner Society Guidelines, no routine follow-up imaging is recommended. These guidelines do not apply to immunocompromised patients and patients with cancer. Follow up in patients with significant comorbidities as clinically warranted. For lung cancer screening, adhere to Lung-RADS guidelines. Reference: Radiology. 2017; 284(1):228-43. 5. Aortic atherosclerosis. Electronically Signed   By: Burman Nieves M.D.   On: 12/23/2022 19:39    Assessment and Plan  Acute sigmoid diverticulitis with 3.6 cm abscess Leukocytosis appears to be chronic, no fever or signs of sepsis.  Treatment with antibiotics alone versus may possibly need percutaneous drainage?  General surgery has been consulted for recommendations.  Keep n.p.o. at this time.  Continue Zosyn, IV fluid hydration, and pain management.  Incidental pulmonary nodule CT showing left solid pulmonary nodule measuring 5 mm.  He will need outpatient follow-up imaging/close monitoring given history of longstanding heavy cigarette smoking.  CAD status post multiple PCI's, most recent in 01/2021: Not endorsing any anginal symptoms at this time.  History of TIA in 2021 Hold on aspirin and Plavix until he is evaluated by general surgery.  OSA on CPAP Continue  nightly CPAP.  Cigarette smoking Smokes 2 packs of cigarettes daily.  He has been counseled to quit and NicoDerm patch ordered.  Hypertension: Blood pressure stable. Hyperlipidemia/hypertriglyceridemia Anxiety and depression RLS Pharmacy med rec pending.  DVT prophylaxis: SCDs Code Status: Full Code (discussed with the patient) Level of care: Telemetry bed Admission status: It is my clinical opinion that admission to INPATIENT is reasonable and necessary because of the expectation that this patient will require hospital care that crosses at least 2 midnights to treat this  condition based on the medical complexity of the problems presented.  Given the aforementioned information, the predictability of an adverse outcome is felt to be significant.  John Giovanni MD [facility]  If 7PM-7AM, please contact night-coverage [email or web address]  12/23/2022, 10:33 PM

## 2022-12-23 NOTE — ED Notes (Signed)
Patient ambulated to bathroom without incident 

## 2022-12-23 NOTE — ED Provider Notes (Signed)
[facility] AT [facility] Provider Note   CSN: [id number] Arrival date & time: 12/23/22  1330     History  No chief complaint on file.   Billy Cummings is a 52 y.o. male.  The history is provided by the patient, the spouse and medical records. No language interpreter was used.     52 year old male significant history including obesity, hypertension, OSA, hypertriglyceridemia, prior MI presenting with complaint of abdominal pain.  Patient reports 2 weeks ago he was diagnosed with diverticulitis without CT scan.  He has had diverticulitis in the past and the pain felt similar. Pain is sharp, crampy and localize to LLQ.  He was prescribed antibiotic for which he took for a week and finished with it.  He is still endorsed persistent worsening lower abdominal pain.  Initially it was localized in the left lower quadrant but now it has spread across his lower abdomen.  Rates pain as 10 out of 10.  He is afraid to eat and states bowel movement is painful for him.  He does not endorse any fever chills known nausea vomiting no chest pain or shortness of breath no urinary symptoms.  Denies noticing any blood in the stool.  He has been taking Tylenol at home for pain control..  Home Medications Prior to Admission medications   Medication Sig Start Date End Date Taking? Authorizing Provider  albuterol (VENTOLIN HFA) 108 (90 Base) MCG/ACT inhaler Inhale 1 puff into the lungs every 4 (four) hours as needed for wheezing or shortness of breath.    [provider]  aspirin EC 81 MG tablet Take 81 mg by mouth at bedtime. Swallow whole.    [provider]  busPIRone (BUSPAR) 5 MG tablet Take 5 mg by mouth 2 (two) times daily as needed for anxiety. 01/02/21   [provider]  citalopram (CELEXA) 20 MG tablet Take 40 mg by mouth at bedtime. 01/09/20   [provider]  clopidogrel (PLAVIX) 75 MG tablet TAKE ONE TABLET BY MOUTH DAILY AT 6 AM 05/10/21    Tobb, Kardie, DO  ezetimibe (ZETIA) 10 MG tablet Take 10 mg by mouth daily.    [provider]  famotidine (PEPCID) 20 MG tablet Take 20 mg by mouth daily.    [provider]  fenofibrate 160 MG tablet Take 160 mg by mouth at bedtime.    [provider]  folic acid (FOLVITE) 1 MG tablet Take 1 mg by mouth at bedtime.    [provider]  furosemide (LASIX) 20 MG tablet Take 1 tablet (20 mg total) by mouth daily. 01/29/22   Tobb, Kardie, DO  hydrOXYzine (ATARAX/VISTARIL) 50 MG tablet Take 50 mg by mouth every 6 (six) hours as needed for anxiety. 12/31/20   [provider]  levocetirizine (XYZAL) 5 MG tablet Take 5 mg by mouth every evening.    [provider]  losartan (COZAAR) 25 MG tablet Take 25 mg by mouth daily.    [provider]  nitroGLYCERIN (NITROSTAT) 0.4 MG SL tablet Place 0.4 mg under the tongue every 5 (five) minutes as needed for chest pain. As directed    [provider]  omega-3 acid ethyl esters (LOVAZA) 1 g capsule Take 1 capsule (1 g total) by mouth 2 (two) times daily. 02/07/21   Tobb, Kardie, DO  ranolazine (RANEXA) 500 MG 12 hr tablet Take 1 tablet (500 mg total) by mouth 2 (two) times daily. 01/29/22   Tobb, Lavona [place]ound, DO  rosuvastatin (CRESTOR) 40 MG tablet Take 40 mg by mouth at bedtime. 10/18/19   [provider]      Allergies    Patient has no known allergies.    Review of Systems   Review of Systems  All other systems reviewed and are negative.   Physical Exam Updated Vital Signs BP 115/82 (BP Location: Right Arm)   Pulse 77   Temp 97.9 F (36.6 C)   Resp 16   Ht 6\' 2"  (1.88 m)   Wt 108.9 kg   SpO2 97%   BMI 30.81 kg/m  Physical Exam Vitals and nursing note reviewed.  Constitutional:      General: He is not in acute distress.    Appearance: He is well-developed.  HENT:     Head: Atraumatic.  Eyes:     Conjunctiva/sclera: Conjunctivae normal.  Cardiovascular:     Rate  and Rhythm: Normal rate and regular rhythm.     Pulses: Normal pulses.     Heart sounds: Normal heart sounds.  Pulmonary:     Effort: Pulmonary effort is normal.     Breath sounds: Normal breath sounds.  Abdominal:     Tenderness: There is abdominal tenderness. There is no guarding or rebound.  Musculoskeletal:     Cervical back: Neck supple.  Skin:    Findings: No rash.  Neurological:     Mental Status: He is alert.     ED Results / Procedures / Treatments   Labs (all labs ordered are listed, but only abnormal results are displayed) Labs Reviewed  CBC WITH DIFFERENTIAL/PLATELET - Abnormal; Notable for the following components:      Result Value   WBC 15.2 (*)    Neutro Abs 8.9 (*)    Lymphs Abs 4.3 (*)    Monocytes Absolute 1.5 (*)    All other components within normal limits  I-STAT CHEM 8, ED - Abnormal; Notable for the following components:   Creatinine, Ser 1.30 (*)    Calcium, Ion 1.10 (*)    All other components within normal limits  COMPREHENSIVE METABOLIC PANEL  LIPASE, BLOOD  URINALYSIS, ROUTINE W REFLEX MICROSCOPIC    EKG None  Radiology CT ABDOMEN PELVIS W CONTRAST  Result Date: 12/23/2022 CLINICAL DATA:  Diverticulitis with complication suspected. Left lower quadrant pain. EXAM: CT ABDOMEN AND PELVIS WITH CONTRAST TECHNIQUE: Multidetector CT imaging of the abdomen and pelvis was performed using the standard protocol following bolus administration of intravenous contrast. RADIATION DOSE REDUCTION: This exam was performed according to the departmental dose-optimization program which includes automated exposure control, adjustment of the mA and/or kV according to patient size and/or use of iterative reconstruction technique. CONTRAST:  75mL OMNIPAQUE IOHEXOL 350 MG/ML SOLN COMPARISON:  11/21/2017 FINDINGS: Lower chest: 5 mm nodule in the left lung base laterally, series 4, image 3. No change since prior study. Long-term stability is consistent with benign etiology.  No imaging follow-up is indicated. Hepatobiliary: Diffuse fatty infiltration of the liver. No focal lesions. Gallbladder and bile ducts are normal. Pancreas: Unremarkable. No pancreatic ductal dilatation or surrounding inflammatory changes. Spleen: Normal in size without focal abnormality. Adrenals/Urinary Tract: Adrenal glands are unremarkable. Kidneys are normal, without renal calculi, focal lesion, or hydronephrosis. Bladder wall is mildly thickened diffusely. This may indicate reactive inflammation or cystitis. Correlate with urinalysis. Stomach/Bowel: Stomach, small bowel, and colon are not abnormally distended. Scattered colonic diverticula. There is mild infiltration around the mid sigmoid colon with a focal pericolonic collection measuring 3.7 cm diameter. This  likely represents acute diverticulitis with abscess. Follow-up after resolution of the acute process is recommended to exclude underlying neoplasm. Appendix is normal. Vascular/Lymphatic: Aortic atherosclerosis. No enlarged abdominal or pelvic lymph nodes. Reproductive: Prostate is unremarkable. Other: No free air or free fluid. Abdominal wall musculature appears intact. Musculoskeletal: No acute or significant osseous findings. IMPRESSION: 1. Sigmoid colonic diverticulosis with 3.6 cm pericolonic collection and adjacent stranding. This likely represents acute diverticulitis with abscess. Follow-up after resolution of the acute process is recommended to exclude underlying neoplasm. 2. Diffuse fatty infiltration of the liver. 3. Mild thickening of bladder wall may indicate reactive inflammation or cystitis. Correlate with urinalysis. 4. Left solid pulmonary nodule measuring 5 mm. Per Fleischner Society Guidelines, no routine follow-up imaging is recommended. These guidelines do not apply to immunocompromised patients and patients with cancer. Follow up in patients with significant comorbidities as clinically warranted. For lung cancer screening, adhere  to Lung-RADS guidelines. Reference: Radiology. 2017; 284(1):228-43. 5. Aortic atherosclerosis. Electronically Signed   By: Burman Nieves M.D.   On: 12/23/2022 19:39    Procedures .Critical Care  Performed by: Fayrene Helper, PA-C Authorized by: Fayrene Helper, PA-C   Critical care provider statement:    Critical care time (minutes):  30   Critical care was time spent personally by me on the following activities:  Development of treatment plan with patient or surrogate, discussions with consultants, evaluation of patient's response to treatment, examination of patient, ordering and review of laboratory studies, ordering and review of radiographic studies, ordering and performing treatments and interventions, pulse oximetry, re-evaluation of patient's condition and review of old charts     Medications Ordered in ED Medications  piperacillin-tazobactam (ZOSYN) IVPB 3.375 g (0 g Intravenous Stopped 12/23/22 2110)    Followed by  piperacillin-tazobactam (ZOSYN) IVPB 3.375 g (has no administration in time range)  morphine (PF) 4 MG/ML injection 4 mg (4 mg Intravenous Given 12/23/22 1725)  sodium chloride 0.9 % bolus 1,000 mL (0 mLs Intravenous Stopped 12/23/22 1941)  iohexol (OMNIPAQUE) 350 MG/ML injection 75 mL (75 mLs Intravenous Contrast Given 12/23/22 1744)  morphine (PF) 4 MG/ML injection 4 mg (4 mg Intravenous Given 12/23/22 2006)    ED Course/ Medical Decision Making/ A&P                                 Medical Decision Making Risk Prescription drug management. Decision regarding hospitalization.   BP 118/70   Pulse 70   Temp 98 F (36.7 C) (Oral)   Resp 16   Ht 6\' 2"  (1.88 m)   Wt 108.9 kg   SpO2 96%   BMI 30.81 kg/m   58:65 PM 52 year old male significant history including obesity, hypertension, OSA, hypertriglyceridemia, prior MI presenting with complaint of abdominal pain.  Patient reports 2 weeks ago he was diagnosed with diverticulitis without CT scan.  He has had  diverticulitis in the past and the pain felt similar. Pain is sharp, crampy and localize to LLQ.  He was prescribed antibiotic for which he took for a week and finished with it.  He is still endorsed persistent worsening lower abdominal pain.  Initially it was localized in the left lower quadrant but now it has spread across his lower abdomen.  Rates pain as 10 out of 10.  He is afraid to eat and states bowel movement is painful for him.  He does not endorse any fever chills known nausea vomiting no chest  pain or shortness of breath no urinary symptoms.  Denies noticing any blood in the stool.  He has been taking Tylenol at home for pain control..  On exam, patient is resting comfortably in bed appears to be in no acute discomfort.  Heart with normal rate and rhythm, lungs clear to auscultation bilaterally abdomen is soft with tenderness to left lower quadrant and suprapubic region without guarding or rebound tenderness.  No hernia noted.  No overlying skin changes.  -Labs ordered, independently viewed and interpreted by me.  Labs remarkable for WBC 15.2 -The patient was maintained on a cardiac monitor.  I personally viewed and interpreted the cardiac monitored which showed an underlying rhythm of: NSR -Imaging independently viewed and interpreted by me and I agree with radiologist's interpretation.  Result remarkable for abd/pelvis CT showing diverticulitis at sigmoid with abscess -This patient presents to the ED for concern of abd pain, this involves an extensive number of treatment options, and is a complaint that carries with it a high risk of complications and morbidity.  The differential diagnosis includes diverticulitis with complication, colitis, UTI, kidney stone, muscle strain, bowel ischemia -Co morbidities that complicate the patient evaluation includes hx of recurrent diverticulitis -Treatment includes morphine, zosyn -Reevaluation of the patient after these medicines showed that the patient  improved -PCP office notes or outside notes reviewed -Discussion with specialist general surgery Dr. Derrell Lolling who recommend medicine admission and starting pt on Zosyn -Escalation to admission/observation considered: patient is agreeable with admission.   9:23 PM Appreciate consultation from [facility] hospitalist, Dr. Loney Loh who agrees to admit patient for further care.        Final Clinical Impression(s) / ED Diagnoses Final diagnoses:  Diverticulitis of large intestine with complication    Rx / DC Orders ED Discharge Orders     None         Fayrene Helper, PA-C 12/23/22 2125    Lonell Grandchild, MD 12/24/22 1135

## 2022-12-23 NOTE — ED Provider Triage Note (Signed)
Emergency Medicine Provider Triage Evaluation Note  Billy Cummings , a 52 y.o. male  was evaluated in triage.  Pt complains of abdominal pain.  Review of Systems  Positive:  Negative:   Physical Exam  BP 115/82 (BP Location: Right Arm)   Pulse 77   Temp 97.9 F (36.6 C)   Resp 16   Ht 6\' 2"  (1.88 m)   Wt 108.9 kg   SpO2 97%   BMI 30.81 kg/m  Gen:   Awake, no distress   Resp:  Normal effort  MSK:   Moves extremities without difficulty  Other:    Medical Decision Making  Medically screening exam initiated at 3:04 PM.  Appropriate orders placed.  Billy Cummings was informed that the remainder of the evaluation will be completed by another provider, this initial triage assessment does not replace that evaluation, and the importance of remaining in the ED until their evaluation is complete.  Concerned for lower abdominal pain x2 weeks. Patient was given ABX for possible diverticulitis which has not helped symptoms. Finished course of ABX on Thursday. Also endorsing dysuria 4 days ago that has since resolved. Also endorsing fever. Also with diarrhea that resolved 2 days ago. Last BM today.   Denies dyspnea, nausea, vomiting, hematochezia, hematuria.    Billy Cummings, [place] 12/23/22 [phone]

## 2022-12-23 NOTE — ED Notes (Signed)
ED TO INPATIENT HANDOFF REPORT  ED Nurse Name and Phone #: Gildardo Pounds Name/Age/Gender Billy Cummings 52 y.o. male Room/Bed: 042C/042C  Code Status   Code Status: Prior  Home/SNF/Other Home Patient oriented to: self, place, time, and situation Is this baseline? Yes   Triage Complete: Triage complete  Chief Complaint Acute diverticulitis [K57.92]  Triage Note Pt c/o lower abd pain since Saturday before last; hx diverticulitis; was given abx, finished Thursday; endorses fevers, dysuria; denies n/v   Allergies No Known Allergies  Level of Care/Admitting Diagnosis ED Disposition     ED Disposition  Admit   Condition  --   Comment  Hospital Area: [facility] [[id number]]  Level of Care: Telemetry Medical [104]  May place patient in observation at [facility] or [facility] if equivalent level of care is available:: Yes  Covid Evaluation: Asymptomatic - no recent exposure (last 10 days) testing not required  Diagnosis: Acute diverticulitis [[id number]]  Admitting Physician: John Giovanni [[id number]]  Attending Physician: John Giovanni [[id number]]          B Medical/Surgery History Past Medical History:  Diagnosis Date   Anxiety    Arthritis    Cigarette smoker 08/29/2015   Coronary artery disease involving native coronary artery of native heart without angina pectoris 08/29/2015   Overview:  STEMI -late restenosis ,inferior wall--stenting of RCA x 2 08/15/15 and old PCI and stent of RCA in 2012   Depression    Dyslipidemia 08/29/2015   Essential hypertension 02/23/2020   History of myocardial infarction    History of TIA (transient ischemic attack) 2021   Hypertriglyceridemia 05/11/2017   Leukocytosis 07/17/2020   OSA (obstructive sleep apnea) 06/28/2020   Severe, AHI 40/hour-210 pounds   Restless leg syndrome    Past Surgical History:  Procedure Laterality Date   CORONARY ANGIOPLASTY WITH STENT PLACEMENT Left 08/15/2015   CORONARY  PRESSURE/FFR STUDY N/A 12/26/2021   Procedure: INTRAVASCULAR PRESSURE WIRE/FFR STUDY;  Surgeon: Kathleene Hazel, MD;  Location: [facility] INVASIVE CV LAB;  Service: Cardiovascular;  Laterality: N/A;   CORONARY STENT INTERVENTION N/A 01/23/2021   Procedure: CORONARY STENT INTERVENTION;  Surgeon: Tonny Bollman, MD;  Location: [facility] INVASIVE CV LAB;  Service: Cardiovascular;  Laterality: N/A;   LEFT HEART CATH AND CORONARY ANGIOGRAPHY N/A 01/23/2021   Procedure: LEFT HEART CATH AND CORONARY ANGIOGRAPHY;  Surgeon: Tonny Bollman, MD;  Location: [facility] INVASIVE CV LAB;  Service: Cardiovascular;  Laterality: N/A;   RIGHT/LEFT HEART CATH AND CORONARY ANGIOGRAPHY N/A 12/26/2021   Procedure: RIGHT/LEFT HEART CATH AND CORONARY ANGIOGRAPHY;  Surgeon: Kathleene Hazel, MD;  Location: [facility] INVASIVE CV LAB;  Service: Cardiovascular;  Laterality: N/A;     A IV Location/Drains/Wounds Patient Lines/Drains/Airways Status     Active Line/Drains/Airways     Name Placement date Placement time Site Days   Peripheral IV 12/23/22 20 G Right Antecubital 12/23/22  1633  Antecubital  less than 1            Intake/Output Last 24 hours  Intake/Output Summary (Last 24 hours) at 12/23/2022 2142 Last data filed at 12/23/2022 1941 Gross per 24 hour  Intake 999 ml  Output --  Net 999 ml    Labs/Imaging Results for orders placed or performed during the hospital encounter of 12/23/22 (from the past 48 hour(s))  Urinalysis, Routine w reflex microscopic -Urine, Clean Catch     Status: None   Collection Time: 12/23/22  3:07 PM  Result Value Ref Range   Color,  Urine YELLOW YELLOW   APPearance CLEAR CLEAR   Specific Gravity, Urine 1.005 1.005 - 1.030   pH 6.0 5.0 - 8.0   Glucose, UA NEGATIVE NEGATIVE mg/dL   Hgb urine dipstick NEGATIVE NEGATIVE   Bilirubin Urine NEGATIVE NEGATIVE   Ketones, ur NEGATIVE NEGATIVE mg/dL   Protein, ur NEGATIVE NEGATIVE mg/dL   Nitrite NEGATIVE NEGATIVE   Leukocytes,Ua NEGATIVE NEGATIVE     Comment: Performed at [facility], [address], [place], [place] [address]  Comprehensive metabolic panel     Status: None   Collection Time: 12/23/22  3:09 PM  Result Value Ref Range   Sodium 135 135 - 145 mmol/L   Potassium 4.3 3.5 - 5.1 mmol/L   Chloride 98 98 - 111 mmol/L   CO2 25 22 - 32 mmol/L   Glucose, Bld 92 70 - 99 mg/dL    Comment: Glucose reference range applies only to samples taken after fasting for at least 8 hours.   BUN 10 6 - 20 mg/dL   Creatinine, Ser 9.56 0.61 - 1.24 mg/dL   Calcium 9.2 8.9 - 38.7 mg/dL   Total Protein 7.8 6.5 - 8.1 g/dL   Albumin 3.6 3.5 - 5.0 g/dL   AST 22 15 - 41 U/L   ALT 27 0 - 44 U/L   Alkaline Phosphatase 90 38 - 126 U/L   Total Bilirubin 0.6 0.3 - 1.2 mg/dL   GFR, Estimated >56 >43 mL/min    Comment: (NOTE) Calculated using the CKD-EPI Creatinine Equation (2021)    Anion gap 12 5 - 15    Comment: Performed at [facility], [address], [place], [place] [address]  Lipase, blood     Status: None   Collection Time: 12/23/22  3:09 PM  Result Value Ref Range   Lipase 25 11 - 51 U/L    Comment: Performed at [facility], [address], [place], [place] [address]  CBC with Diff     Status: Abnormal   Collection Time: 12/23/22  3:09 PM  Result Value Ref Range   WBC 15.2 (H) 4.0 - 10.5 K/uL   RBC 4.90 4.22 - 5.81 MIL/uL   Hemoglobin 14.7 13.0 - 17.0 g/dL   HCT 60.6 30.1 - 60.1 %   MCV 88.2 80.0 - 100.0 fL   MCH 30.0 26.0 - 34.0 pg   MCHC 34.0 30.0 - 36.0 g/dL   RDW 09.3 23.5 - 57.3 %   Platelets 395 150 - 400 K/uL   nRBC 0.0 0.0 - 0.2 %   Neutrophils Relative % 58 %   Neutro Abs 8.9 (H) 1.7 - 7.7 K/uL   Lymphocytes Relative 29 %   Lymphs Abs 4.3 (H) 0.7 - 4.0 K/uL   Monocytes Relative 10 %   Monocytes Absolute 1.5 (H) 0.1 - 1.0 K/uL   Eosinophils Relative 2 %   Eosinophils Absolute 0.3 0.0 - 0.5 K/uL   Basophils Relative 1 %   Basophils Absolute 0.1 0.0 - 0.1 K/uL   Immature Granulocytes 0 %   Abs  Immature Granulocytes 0.06 0.00 - 0.07 K/uL    Comment: Performed at [facility], [address], [place], [place] [address]  I-stat chem 8, ED (not at [facility], [facility] or [facility])     Status: Abnormal   Collection Time: 12/23/22  3:18 PM  Result Value Ref Range   Sodium 135 135 - 145 mmol/L   Potassium 4.5 3.5 - 5.1 mmol/L  Chloride 101 98 - 111 mmol/L   BUN 11 6 - 20 mg/dL   Creatinine, Ser 0.98 (H) 0.61 - 1.24 mg/dL   Glucose, Bld 98 70 - 99 mg/dL    Comment: Glucose reference range applies only to samples taken after fasting for at least 8 hours.   Calcium, Ion 1.10 (L) 1.15 - 1.40 mmol/L   TCO2 22 22 - 32 mmol/L   Hemoglobin 15.0 13.0 - 17.0 g/dL   HCT 11.9 14.7 - 82.9 %   CT ABDOMEN PELVIS W CONTRAST  Result Date: 12/23/2022 CLINICAL DATA:  Diverticulitis with complication suspected. Left lower quadrant pain. EXAM: CT ABDOMEN AND PELVIS WITH CONTRAST TECHNIQUE: Multidetector CT imaging of the abdomen and pelvis was performed using the standard protocol following bolus administration of intravenous contrast. RADIATION DOSE REDUCTION: This exam was performed according to the departmental dose-optimization program which includes automated exposure control, adjustment of the mA and/or kV according to patient size and/or use of iterative reconstruction technique. CONTRAST:  75mL OMNIPAQUE IOHEXOL 350 MG/ML SOLN COMPARISON:  11/21/2017 FINDINGS: Lower chest: 5 mm nodule in the left lung base laterally, series 4, image 3. No change since prior study. Long-term stability is consistent with benign etiology. No imaging follow-up is indicated. Hepatobiliary: Diffuse fatty infiltration of the liver. No focal lesions. Gallbladder and bile ducts are normal. Pancreas: Unremarkable. No pancreatic ductal dilatation or surrounding inflammatory changes. Spleen: Normal in size without focal abnormality. Adrenals/Urinary Tract: Adrenal glands are unremarkable. Kidneys are normal, without renal calculi, focal lesion,  or hydronephrosis. Bladder wall is mildly thickened diffusely. This may indicate reactive inflammation or cystitis. Correlate with urinalysis. Stomach/Bowel: Stomach, small bowel, and colon are not abnormally distended. Scattered colonic diverticula. There is mild infiltration around the mid sigmoid colon with a focal pericolonic collection measuring 3.7 cm diameter. This likely represents acute diverticulitis with abscess. Follow-up after resolution of the acute process is recommended to exclude underlying neoplasm. Appendix is normal. Vascular/Lymphatic: Aortic atherosclerosis. No enlarged abdominal or pelvic lymph nodes. Reproductive: Prostate is unremarkable. Other: No free air or free fluid. Abdominal wall musculature appears intact. Musculoskeletal: No acute or significant osseous findings. IMPRESSION: 1. Sigmoid colonic diverticulosis with 3.6 cm pericolonic collection and adjacent stranding. This likely represents acute diverticulitis with abscess. Follow-up after resolution of the acute process is recommended to exclude underlying neoplasm. 2. Diffuse fatty infiltration of the liver. 3. Mild thickening of bladder wall may indicate reactive inflammation or cystitis. Correlate with urinalysis. 4. Left solid pulmonary nodule measuring 5 mm. Per Fleischner Society Guidelines, no routine follow-up imaging is recommended. These guidelines do not apply to immunocompromised patients and patients with cancer. Follow up in patients with significant comorbidities as clinically warranted. For lung cancer screening, adhere to Lung-RADS guidelines. Reference: Radiology. 2017; 284(1):228-43. 5. Aortic atherosclerosis. Electronically Signed   By: Burman Nieves M.D.   On: 12/23/2022 19:39    Pending Labs Unresulted Labs (From admission, onward)    None       Vitals/Pain Today's Vitals   12/23/22 2000 12/23/22 2030 12/23/22 2045 12/23/22 2051  BP: 123/75 114/79    Pulse: 72 75    Resp:  15    Temp:    98.6  F (37 C)  TempSrc:    Oral  SpO2: 97% 96%    Weight:      Height:      PainSc:   0-No pain     Isolation Precautions No active isolations  Medications Medications  piperacillin-tazobactam (ZOSYN) IVPB 3.375 g (  0 g Intravenous Stopped 12/23/22 2110)    Followed by  piperacillin-tazobactam (ZOSYN) IVPB 3.375 g (has no administration in time range)  morphine (PF) 4 MG/ML injection 4 mg (4 mg Intravenous Given 12/23/22 1725)  sodium chloride 0.9 % bolus 1,000 mL (0 mLs Intravenous Stopped 12/23/22 1941)  iohexol (OMNIPAQUE) 350 MG/ML injection 75 mL (75 mLs Intravenous Contrast Given 12/23/22 1744)  morphine (PF) 4 MG/ML injection 4 mg (4 mg Intravenous Given 12/23/22 2006)    Mobility walks     Focused Assessments Neuro Assessment Handoff:  Swallow screen pass? Yes          Neuro Assessment:   Neuro Checks:      Has TPA been given? No If patient is a Neuro Trauma and patient is going to OR before floor call report to 4N Charge nurse: [phone] or [phone]   R Recommendations: See Admitting Provider Note  Report given to:   Additional Notes: pt is AAOx4. Pt is on room air.

## 2022-12-23 NOTE — ED Triage Notes (Signed)
Pt c/o lower abd pain since Saturday before last; hx diverticulitis; was given abx, finished Thursday; endorses fevers, dysuria; denies n/v

## 2022-12-24 ENCOUNTER — Encounter (HOSPITAL_COMMUNITY): Payer: Self-pay | Admitting: Internal Medicine

## 2022-12-24 DIAGNOSIS — K5792 Diverticulitis of intestine, part unspecified, without perforation or abscess without bleeding: Secondary | ICD-10-CM

## 2022-12-24 LAB — CBC
HCT: 41.9 % (ref 39.0–52.0)
Hemoglobin: 14.1 g/dL (ref 13.0–17.0)
MCH: 29.4 pg (ref 26.0–34.0)
MCHC: 33.7 g/dL (ref 30.0–36.0)
MCV: 87.5 fL (ref 80.0–100.0)
Platelets: 359 10*3/uL (ref 150–400)
RBC: 4.79 MIL/uL (ref 4.22–5.81)
RDW: 13.4 % (ref 11.5–15.5)
WBC: 12.1 10*3/uL — ABNORMAL HIGH (ref 4.0–10.5)
nRBC: 0 % (ref 0.0–0.2)

## 2022-12-24 LAB — BASIC METABOLIC PANEL
Anion gap: 11 (ref 5–15)
BUN: 10 mg/dL (ref 6–20)
CO2: 23 mmol/L (ref 22–32)
Calcium: 8.4 mg/dL — ABNORMAL LOW (ref 8.9–10.3)
Chloride: 103 mmol/L (ref 98–111)
Creatinine, Ser: 1.14 mg/dL (ref 0.61–1.24)
GFR, Estimated: >60 mL/min (ref >60)
Glucose, Bld: 90 mg/dL (ref 70–99)
Potassium: 3.9 mmol/L (ref 3.5–5.1)
Sodium: 137 mmol/L (ref 135–145)

## 2022-12-24 LAB — PROTIME-INR
INR: 1 (ref 0.8–1.2)
Prothrombin Time: 13.5 s (ref 11.4–15.2)

## 2022-12-24 LAB — HIV ANTIBODY (ROUTINE TESTING W REFLEX): HIV Screen 4th Generation wRfx: NONREACTIVE

## 2022-12-24 MED ORDER — NICOTINE POLACRILEX 2 MG MT GUM: 2.0000 mg | CHEWING_GUM | OROMUCOSAL | Status: DC | PRN

## 2022-12-24 NOTE — Consult Note (Addendum)
Chief Complaint: Patient was seen in consultation today for diverticular abscess drain at the request of Dr Grayling Congress  Supervising Physician: Marliss Coots  Patient Status: [facility] - In-pt  History of Present Illness: Billy Cummings is a 52 y.o. male   FULL Code status per pt Hx CAD; HLD; HTN New onset abd pain 3 weeks hx diverticulitis-- had been on antibiotics Pain persists  CT:  IMPRESSION: 1. Sigmoid colonic diverticulosis with 3.6 cm pericolonic collection and adjacent stranding. This likely represents acute diverticulitis with abscess. Follow-up after resolution of the acute process is recommended to exclude underlying neoplasm. 2. Diffuse fatty infiltration of the liver. 3. Mild thickening of bladder wall may indicate reactive inflammation or cystitis. Correlate with urinalysis. 4. Left solid pulmonary nodule measuring 5 mm. Per Fleischner Society Guidelines, no routine follow-up imaging is recommended. These guidelines do not apply to immunocompromised patients and patients with cancer. Follow up in patients with significant comorbidities as clinically warranted. For lung cancer screening, adhere to Lung-RADS guidelines  Request made for diverticular abscess drain placement Approved by Dr Elby Showers LD ASA/Plavix 9/3----planned IR procedure for 9/6      Past Medical History:  Diagnosis Date   Anxiety    Arthritis    Cigarette smoker 08/29/2015   Coronary artery disease involving native coronary artery of native heart without angina pectoris 08/29/2015   Overview:  STEMI -late restenosis ,inferior wall--stenting of RCA x 2 08/15/15 and old PCI and stent of RCA in 2012   Depression    Dyslipidemia 08/29/2015   Essential hypertension 02/23/2020   History of myocardial infarction    History of TIA (transient ischemic attack) 2021   Hypertriglyceridemia 05/11/2017   Leukocytosis 07/17/2020   OSA (obstructive sleep apnea) 06/28/2020   Severe, AHI 40/hour-210 pounds    Restless leg syndrome     Past Surgical History:  Procedure Laterality Date   CORONARY ANGIOPLASTY WITH STENT PLACEMENT Left 08/15/2015   CORONARY PRESSURE/FFR STUDY N/A 12/26/2021   Procedure: INTRAVASCULAR PRESSURE WIRE/FFR STUDY;  Surgeon: Kathleene Hazel, MD;  Location: [facility] INVASIVE CV LAB;  Service: Cardiovascular;  Laterality: N/A;   CORONARY STENT INTERVENTION N/A 01/23/2021   Procedure: CORONARY STENT INTERVENTION;  Surgeon: Tonny Bollman, MD;  Location: [facility] INVASIVE CV LAB;  Service: Cardiovascular;  Laterality: N/A;   LEFT HEART CATH AND CORONARY ANGIOGRAPHY N/A 01/23/2021   Procedure: LEFT HEART CATH AND CORONARY ANGIOGRAPHY;  Surgeon: Tonny Bollman, MD;  Location: [facility] INVASIVE CV LAB;  Service: Cardiovascular;  Laterality: N/A;   RIGHT/LEFT HEART CATH AND CORONARY ANGIOGRAPHY N/A 12/26/2021   Procedure: RIGHT/LEFT HEART CATH AND CORONARY ANGIOGRAPHY;  Surgeon: Kathleene Hazel, MD;  Location: [facility] INVASIVE CV LAB;  Service: Cardiovascular;  Laterality: N/A;    Allergies: Patient has no known allergies.  Medications: Prior to Admission medications   Medication Sig Start Date End Date Taking? Authorizing Provider  aspirin EC 81 MG tablet Take 81 mg by mouth at bedtime. Swallow whole.   Yes [provider]  clopidogrel (PLAVIX) 75 MG tablet TAKE ONE TABLET BY MOUTH DAILY AT 6 AM 05/10/21  Yes Tobb, Kardie, DO  albuterol (VENTOLIN HFA) 108 (90 Base) MCG/ACT inhaler Inhale 1 puff into the lungs every 4 (four) hours as needed for wheezing or shortness of breath. Patient not taking: Reported on 12/24/2022    [provider]  amoxicillin-clavulanate (AUGMENTIN) 875-125 MG tablet Take 1 tablet by mouth 2 (two) times daily. Patient not taking: Reported on 12/24/2022 12/13/22   [provider]     Family History  Problem Relation Age of Onset   Heart attack Mother    Heart disease Mother    Breast cancer Mother    Heart attack Father    Heart disease  Father    Heart attack Brother    Heart disease Brother    Bladder Cancer Brother     Social History   Socioeconomic History   Marital status: Divorced    Spouse name: Not on file   Number of children: Not on file   Years of education: Not on file   Highest education level: Not on file  Occupational History   Not on file  Tobacco Use   Smoking status: Every Day    Current packs/day: 1.50    Average packs/day: 1.5 packs/day for 38.0 years (57.0 ttl pk-yrs)    Types: Cigarettes   Smokeless tobacco: Never   Tobacco comments:    smokes 1 pack per day 06/28/2020  Vaping Use   Vaping status: Never Used  Substance and Sexual Activity   Alcohol use: No    Alcohol/week: 0.0 standard drinks of alcohol   Drug use: No   Sexual activity: Not on file  Other Topics Concern   Not on file  Social History Narrative   Not on file   Social Determinants of Health   Financial Resource Strain: Not on file  Food Insecurity: No Food Insecurity (12/24/2022)   Hunger Vital Sign    Worried About Running Out of Food in the Last Year: Never true    Ran Out of Food in the Last Year: Never true  Transportation Needs: No Transportation Needs (12/24/2022)   PRAPARE - Administrator, Civil Service (Medical): No    Lack of Transportation (Non-Medical): No  Physical Activity: Not on file  Stress: Not on file  Social Connections: Not on file    Review of Systems: A 12 point ROS discussed and pertinent positives are indicated in the HPI above.  All other systems are negative.  Review of Systems  Constitutional:  Positive for activity change and appetite change. Negative for fever.  Respiratory:  Negative for cough and shortness of breath.   Cardiovascular:  Negative for chest pain.  Gastrointestinal:  Positive for abdominal pain and nausea.  Neurological:  Negative for weakness.  Psychiatric/Behavioral:  Negative for behavioral problems and confusion.     Vital Signs: BP 107/70 (BP  Location: Right Arm)   Pulse 68   Temp 98.1 F (36.7 C) (Oral)   Resp 16   Ht 6\' 2"  (1.88 m)   Wt 240 lb (108.9 kg)   SpO2 94%   BMI 30.81 kg/m     Physical Exam Vitals reviewed.  HENT:     Mouth/Throat:     Mouth: Mucous membranes are moist.  Cardiovascular:     Rate and Rhythm: Normal rate and regular rhythm.     Heart sounds: Normal heart sounds.  Pulmonary:     Effort: Pulmonary effort is normal.     Breath sounds: Normal breath sounds.  Abdominal:     Palpations: Abdomen is soft.     Tenderness: There is abdominal tenderness.  Musculoskeletal:        General: Normal range of motion.  Skin:    General: Skin is warm.  Neurological:     Mental Status: He is alert and oriented to person, place, and time.  Psychiatric:        Behavior: Behavior normal.  Imaging: CT ABDOMEN PELVIS W CONTRAST  Result Date: 12/23/2022 CLINICAL DATA:  Diverticulitis with complication suspected. Left lower quadrant pain. EXAM: CT ABDOMEN AND PELVIS WITH CONTRAST TECHNIQUE: Multidetector CT imaging of the abdomen and pelvis was performed using the standard protocol following bolus administration of intravenous contrast. RADIATION DOSE REDUCTION: This exam was performed according to the departmental dose-optimization program which includes automated exposure control, adjustment of the mA and/or kV according to patient size and/or use of iterative reconstruction technique. CONTRAST:  75mL OMNIPAQUE IOHEXOL 350 MG/ML SOLN COMPARISON:  11/21/2017 FINDINGS: Lower chest: 5 mm nodule in the left lung base laterally, series 4, image 3. No change since prior study. Long-term stability is consistent with benign etiology. No imaging follow-up is indicated. Hepatobiliary: Diffuse fatty infiltration of the liver. No focal lesions. Gallbladder and bile ducts are normal. Pancreas: Unremarkable. No pancreatic ductal dilatation or surrounding inflammatory changes. Spleen: Normal in size without focal abnormality.  Adrenals/Urinary Tract: Adrenal glands are unremarkable. Kidneys are normal, without renal calculi, focal lesion, or hydronephrosis. Bladder wall is mildly thickened diffusely. This may indicate reactive inflammation or cystitis. Correlate with urinalysis. Stomach/Bowel: Stomach, small bowel, and colon are not abnormally distended. Scattered colonic diverticula. There is mild infiltration around the mid sigmoid colon with a focal pericolonic collection measuring 3.7 cm diameter. This likely represents acute diverticulitis with abscess. Follow-up after resolution of the acute process is recommended to exclude underlying neoplasm. Appendix is normal. Vascular/Lymphatic: Aortic atherosclerosis. No enlarged abdominal or pelvic lymph nodes. Reproductive: Prostate is unremarkable. Other: No free air or free fluid. Abdominal wall musculature appears intact. Musculoskeletal: No acute or significant osseous findings. IMPRESSION: 1. Sigmoid colonic diverticulosis with 3.6 cm pericolonic collection and adjacent stranding. This likely represents acute diverticulitis with abscess. Follow-up after resolution of the acute process is recommended to exclude underlying neoplasm. 2. Diffuse fatty infiltration of the liver. 3. Mild thickening of bladder wall may indicate reactive inflammation or cystitis. Correlate with urinalysis. 4. Left solid pulmonary nodule measuring 5 mm. Per Fleischner Society Guidelines, no routine follow-up imaging is recommended. These guidelines do not apply to immunocompromised patients and patients with cancer. Follow up in patients with significant comorbidities as clinically warranted. For lung cancer screening, adhere to Lung-RADS guidelines. Reference: Radiology. 2017; 284(1):228-43. 5. Aortic atherosclerosis. Electronically Signed   By: Burman Nieves M.D.   On: 12/23/2022 19:39    Labs:  CBC: Recent Labs    12/24/21 1024 12/26/21 1228 12/26/21 1228 12/26/21 1454 12/26/21 1501  12/23/22 1509 12/23/22 1518  WBC 19.3* 13.9*  --   --   --  15.2*  --   HGB 17.9* 15.5   < > 13.6 15.0 14.7 15.0  HCT 51.5* 45.0   < > 40.0 44.0 43.2 44.0  PLT 354 294  --   --   --  395  --    < > = values in this interval not displayed.    COAGS: No results for input(s): "INR", "APTT" in the last 8760 hours.  BMP: Recent Labs    12/24/21 1024 12/26/21 1454 12/26/21 1501 01/10/22 1459 12/23/22 1509 12/23/22 1518  NA 139   < > 141 140 135 135  K 4.7   < > 4.2 4.5 4.3 4.5  CL 99  --   --  101 98 101  CO2 26  --   --  22 25  --   GLUCOSE 69*  --   --  80 92 98  BUN 10  --   --  12 10 11   CALCIUM 10.0  --   --  9.5 9.2  --   CREATININE 1.37*  --   --  1.30* 1.24 1.30*  GFRNONAA  --   --   --   --  >60  --    < > = values in this interval not displayed.    LIVER FUNCTION TESTS: Recent Labs    12/23/22 1509  BILITOT 0.6  AST 22  ALT 27  ALKPHOS 90  PROT 7.8  ALBUMIN 3.6    TUMOR MARKERS: No results for input(s): "AFPTM", "CEA", "CA199", "CHROMGRNA" in the last 8760 hours.  Assessment and Plan:  Scheduled for diverticular abscess drain placement Risks and benefits discussed with the patient including bleeding, infection, damage to adjacent structures, bowel perforation/fistula connection, and sepsis.  All of the patient's questions were answered, patient is agreeable to proceed. Consent signed and in chart.  Thank you for this interesting consult.  I greatly enjoyed meeting Billy Cummings and look forward to participating in their care.  A copy of this report was sent to the requesting provider on this date.  Electronically Signed: Robet Leu, PA-C 12/24/2022, 9:13 AM   I spent a total of 20 Minutes    in face to face in clinical consultation, greater than 50% of which was counseling/coordinating care for diverticular abscess drain placement

## 2022-12-24 NOTE — Progress Notes (Signed)
Pharmacy Antibiotic Note  Billy Cummings is a 52 y.o. male admitted on 12/23/2022 with acute diverticulitis with abscess.  Pharmacy has been consulted for Zosyn dosing.  Plan: Zosyn 3.375g IV q8h (4 hour infusion).  Height: 6\' 2"  (188 cm) Weight: 108.9 kg (240 lb) IBW/kg (Calculated) : 82.2  Temp (24hrs), Avg:98.3 F (36.8 C), Min:97.9 F (36.6 C), Max:98.7 F (37.1 C)  Recent Labs  Lab 12/23/22 1509 12/23/22 1518  WBC 15.2*  --   CREATININE 1.24 1.30*    Estimated Creatinine Clearance: 87.3 mL/min (A) (by C-G formula based on SCr of 1.3 mg/dL (H)).    No Known Allergies  Thank you for allowing pharmacy to be a part of this patient's care.  Vernard Gambles, PharmD, BCPS  12/24/2022 2:54 AM

## 2022-12-24 NOTE — Progress Notes (Signed)
   12/24/22 1700  Mobility  Activity Ambulated independently in hallway  Level of Assistance Independent  Assistive Device None  Distance Ambulated (ft) 150 ft  Activity Response Tolerated well  Mobility Referral Yes  $Mobility charge 1 Mobility  Mobility Specialist Start Time (ACUTE ONLY) 1617  Mobility Specialist Stop Time (ACUTE ONLY) 1624  Mobility Specialist Time Calculation (min) (ACUTE ONLY) 7 min   Mobility Specialist: Progress Note  Pt agreeable to mobility session - received in bed. Pt ambulated independently with no AD, c/o wanting to smoke a cigarette. Pt returned to EOB with all needs met - call bell within reach.   Billy Cummings, BS Mobility Specialist Please contact via SecureChat or Rehab office at [phone].

## 2022-12-24 NOTE — Consult Note (Signed)
Reason for Consult: Perforated diverticulitis Referring Physician: Dr. Lacretia Nicks Salvino is an 52 y.o. male.  HPI: Patient is a 52 year old male, with a history of CAD, dyslipidemia, hypertension who comes in secondary to abdominal pain.  According the patient he has had a 3-week history of diverticulitis.  He was previously on antibiotics prior to this hospitalization.  Patient states that the continued abdominal pain brought him to the ER.  Patient is that he has had 2 previous episodes of diverticulitis in the past with small episodes in between.  Patient states pain better this morning.  Currently on antibiotics.  CT scan does show a abscess at the sigmoid colon likely secondary to peripheral diverticulitis.   Past Medical History:  Diagnosis Date   Anxiety    Arthritis    Cigarette smoker 08/29/2015   Coronary artery disease involving native coronary artery of native heart without angina pectoris 08/29/2015   Overview:  STEMI -late restenosis ,inferior wall--stenting of RCA x 2 08/15/15 and old PCI and stent of RCA in 2012   Depression    Dyslipidemia 08/29/2015   Essential hypertension 02/23/2020   History of myocardial infarction    History of TIA (transient ischemic attack) 2021   Hypertriglyceridemia 05/11/2017   Leukocytosis 07/17/2020   OSA (obstructive sleep apnea) 06/28/2020   Severe, AHI 40/hour-210 pounds   Restless leg syndrome     Past Surgical History:  Procedure Laterality Date   CORONARY ANGIOPLASTY WITH STENT PLACEMENT Left 08/15/2015   CORONARY PRESSURE/FFR STUDY N/A 12/26/2021   Procedure: INTRAVASCULAR PRESSURE WIRE/FFR STUDY;  Surgeon: Kathleene Hazel, MD;  Location: [facility] INVASIVE CV LAB;  Service: Cardiovascular;  Laterality: N/A;   CORONARY STENT INTERVENTION N/A 01/23/2021   Procedure: CORONARY STENT INTERVENTION;  Surgeon: Tonny Bollman, MD;  Location: [facility] INVASIVE CV LAB;  Service: Cardiovascular;  Laterality: N/A;   LEFT HEART CATH AND  CORONARY ANGIOGRAPHY N/A 01/23/2021   Procedure: LEFT HEART CATH AND CORONARY ANGIOGRAPHY;  Surgeon: Tonny Bollman, MD;  Location: [facility] INVASIVE CV LAB;  Service: Cardiovascular;  Laterality: N/A;   RIGHT/LEFT HEART CATH AND CORONARY ANGIOGRAPHY N/A 12/26/2021   Procedure: RIGHT/LEFT HEART CATH AND CORONARY ANGIOGRAPHY;  Surgeon: Kathleene Hazel, MD;  Location: [facility] INVASIVE CV LAB;  Service: Cardiovascular;  Laterality: N/A;    Family History  Problem Relation Age of Onset   Heart attack Mother    Heart disease Mother    Breast cancer Mother    Heart attack Father    Heart disease Father    Heart attack Brother    Heart disease Brother    Bladder Cancer Brother     Social History:  reports that he has been smoking cigarettes. He has a 57 pack-year smoking history. He has never used smokeless tobacco. He reports that he does not drink alcohol and does not use drugs.  Allergies: No Known Allergies  Medications: I have reviewed the patient's current medications.  Results for orders placed or performed during the hospital encounter of 12/23/22 (from the past 48 hour(s))  Urinalysis, Routine w reflex microscopic -Urine, Clean Catch     Status: None   Collection Time: 12/23/22  3:07 PM  Result Value Ref Range   Color, Urine YELLOW YELLOW   APPearance CLEAR CLEAR   Specific Gravity, Urine 1.005 1.005 - 1.030   pH 6.0 5.0 - 8.0   Glucose, UA NEGATIVE NEGATIVE mg/dL   Hgb urine dipstick NEGATIVE NEGATIVE   Bilirubin Urine NEGATIVE NEGATIVE   Ketones, ur  NEGATIVE NEGATIVE mg/dL   Protein, ur NEGATIVE NEGATIVE mg/dL   Nitrite NEGATIVE NEGATIVE   Leukocytes,Ua NEGATIVE NEGATIVE    Comment: Performed at [facility], [address], [place], [place] [address]  Comprehensive metabolic panel     Status: None   Collection Time: 12/23/22  3:09 PM  Result Value Ref Range   Sodium 135 135 - 145 mmol/L   Potassium 4.3 3.5 - 5.1 mmol/L   Chloride 98 98 - 111 mmol/L   CO2 25 22 - 32  mmol/L   Glucose, Bld 92 70 - 99 mg/dL    Comment: Glucose reference range applies only to samples taken after fasting for at least 8 hours.   BUN 10 6 - 20 mg/dL   Creatinine, Ser 2.13 0.61 - 1.24 mg/dL   Calcium 9.2 8.9 - 08.6 mg/dL   Total Protein 7.8 6.5 - 8.1 g/dL   Albumin 3.6 3.5 - 5.0 g/dL   AST 22 15 - 41 U/L   ALT 27 0 - 44 U/L   Alkaline Phosphatase 90 38 - 126 U/L   Total Bilirubin 0.6 0.3 - 1.2 mg/dL   GFR, Estimated >57 >84 mL/min    Comment: (NOTE) Calculated using the CKD-EPI Creatinine Equation (2021)    Anion gap 12 5 - 15    Comment: Performed at [facility], [address], [place], [place] [address]  Lipase, blood     Status: None   Collection Time: 12/23/22  3:09 PM  Result Value Ref Range   Lipase 25 11 - 51 U/L    Comment: Performed at [facility], [address] [place], [place] [address]  CBC with Diff     Status: Abnormal   Collection Time: 12/23/22  3:09 PM  Result Value Ref Range   WBC 15.2 (H) 4.0 - 10.5 K/uL   RBC 4.90 4.22 - 5.81 MIL/uL   Hemoglobin 14.7 13.0 - 17.0 g/dL   HCT 32.4 40.1 - 02.7 %   MCV 88.2 80.0 - 100.0 fL   MCH 30.0 26.0 - 34.0 pg   MCHC 34.0 30.0 - 36.0 g/dL   RDW 25.3 66.4 - 40.3 %   Platelets 395 150 - 400 K/uL   nRBC 0.0 0.0 - 0.2 %   Neutrophils Relative % 58 %   Neutro Abs 8.9 (H) 1.7 - 7.7 K/uL   Lymphocytes Relative 29 %   Lymphs Abs 4.3 (H) 0.7 - 4.0 K/uL   Monocytes Relative 10 %   Monocytes Absolute 1.5 (H) 0.1 - 1.0 K/uL   Eosinophils Relative 2 %   Eosinophils Absolute 0.3 0.0 - 0.5 K/uL   Basophils Relative 1 %   Basophils Absolute 0.1 0.0 - 0.1 K/uL   Immature Granulocytes 0 %   Abs Immature Granulocytes 0.06 0.00 - 0.07 K/uL    Comment: Performed at [facility], [address], [place], [place] [address]  I-stat chem 8, ED (not at [facility], [facility] or [facility])     Status: Abnormal   Collection Time: 12/23/22  3:18 PM  Result Value Ref Range   Sodium 135 135 - 145 mmol/L   Potassium 4.5 3.5  - 5.1 mmol/L   Chloride 101 98 - 111 mmol/L   BUN 11 6 - 20 mg/dL   Creatinine, Ser 9.56 (H) 0.61 - 1.24 mg/dL   Glucose, Bld 98 70 - 99 mg/dL    Comment: Glucose reference range applies only to samples taken after  fasting for at least 8 hours.   Calcium, Ion 1.10 (L) 1.15 - 1.40 mmol/L   TCO2 22 22 - 32 mmol/L   Hemoglobin 15.0 13.0 - 17.0 g/dL   HCT 43.3 29.5 - 18.8 %    CT ABDOMEN PELVIS W CONTRAST  Result Date: 12/23/2022 CLINICAL DATA:  Diverticulitis with complication suspected. Left lower quadrant pain. EXAM: CT ABDOMEN AND PELVIS WITH CONTRAST TECHNIQUE: Multidetector CT imaging of the abdomen and pelvis was performed using the standard protocol following bolus administration of intravenous contrast. RADIATION DOSE REDUCTION: This exam was performed according to the departmental dose-optimization program which includes automated exposure control, adjustment of the mA and/or kV according to patient size and/or use of iterative reconstruction technique. CONTRAST:  75mL OMNIPAQUE IOHEXOL 350 MG/ML SOLN COMPARISON:  11/21/2017 FINDINGS: Lower chest: 5 mm nodule in the left lung base laterally, series 4, image 3. No change since prior study. Long-term stability is consistent with benign etiology. No imaging follow-up is indicated. Hepatobiliary: Diffuse fatty infiltration of the liver. No focal lesions. Gallbladder and bile ducts are normal. Pancreas: Unremarkable. No pancreatic ductal dilatation or surrounding inflammatory changes. Spleen: Normal in size without focal abnormality. Adrenals/Urinary Tract: Adrenal glands are unremarkable. Kidneys are normal, without renal calculi, focal lesion, or hydronephrosis. Bladder wall is mildly thickened diffusely. This may indicate reactive inflammation or cystitis. Correlate with urinalysis. Stomach/Bowel: Stomach, small bowel, and colon are not abnormally distended. Scattered colonic diverticula. There is mild infiltration around the mid sigmoid colon with  a focal pericolonic collection measuring 3.7 cm diameter. This likely represents acute diverticulitis with abscess. Follow-up after resolution of the acute process is recommended to exclude underlying neoplasm. Appendix is normal. Vascular/Lymphatic: Aortic atherosclerosis. No enlarged abdominal or pelvic lymph nodes. Reproductive: Prostate is unremarkable. Other: No free air or free fluid. Abdominal wall musculature appears intact. Musculoskeletal: No acute or significant osseous findings. IMPRESSION: 1. Sigmoid colonic diverticulosis with 3.6 cm pericolonic collection and adjacent stranding. This likely represents acute diverticulitis with abscess. Follow-up after resolution of the acute process is recommended to exclude underlying neoplasm. 2. Diffuse fatty infiltration of the liver. 3. Mild thickening of bladder wall may indicate reactive inflammation or cystitis. Correlate with urinalysis. 4. Left solid pulmonary nodule measuring 5 mm. Per Fleischner Society Guidelines, no routine follow-up imaging is recommended. These guidelines do not apply to immunocompromised patients and patients with cancer. Follow up in patients with significant comorbidities as clinically warranted. For lung cancer screening, adhere to Lung-RADS guidelines. Reference: Radiology. 2017; 284(1):228-43. 5. Aortic atherosclerosis. Electronically Signed   By: Burman Nieves M.D.   On: 12/23/2022 19:39    Review of Systems  Constitutional:  Negative for chills and fever.  HENT:  Negative for ear discharge, hearing loss and sore throat.   Eyes:  Negative for discharge.  Respiratory:  Negative for cough and shortness of breath.   Cardiovascular:  Negative for chest pain and leg swelling.  Gastrointestinal:  Positive for abdominal pain. Negative for constipation, diarrhea, nausea and vomiting.  Musculoskeletal:  Negative for myalgias and neck pain.  Skin:  Negative for rash.  Allergic/Immunologic: Negative for environmental  allergies.  Neurological:  Negative for dizziness and seizures.  Hematological:  Does not bruise/bleed easily.  Psychiatric/Behavioral:  Negative for suicidal ideas.   All other systems reviewed and are negative.  Blood pressure 117/69, pulse 73, temperature 98.7 F (37.1 C), temperature source Oral, resp. rate 17, height 6\' 2"  (1.88 m), weight 108.9 kg, SpO2 95%. Physical Exam Constitutional:  Appearance: He is well-developed.     Comments: Conversant No acute distress  HENT:     Head: Normocephalic and atraumatic.  Eyes:     General: Lids are normal. No scleral icterus.    Pupils: Pupils are equal, round, and reactive to light.     Comments: Pupils are equal round and reactive No lid lag Moist conjunctiva  Neck:     Thyroid: No thyromegaly.     Trachea: No tracheal tenderness.     Comments: No cervical lymphadenopathy Cardiovascular:     Rate and Rhythm: Normal rate and regular rhythm.     Heart sounds: No murmur heard. Pulmonary:     Effort: Pulmonary effort is normal.     Breath sounds: Normal breath sounds. No wheezing or rales.  Abdominal:     Tenderness: There is abdominal tenderness in the suprapubic area. There is no guarding or rebound.     Hernia: No hernia is present.  Musculoskeletal:     Cervical back: Normal range of motion and neck supple.  Skin:    General: Skin is warm.     Findings: No rash.     Nails: There is no clubbing.     Comments: Normal skin turgor  Neurological:     Mental Status: He is alert and oriented to person, place, and time.     Comments: Normal gait and station  Psychiatric:        Mood and Affect: Mood normal.        Thought Content: Thought content normal.        Judgment: Judgment normal.     Comments: Appropriate affect     Assessment/Plan: 52 year old male with perforated diverticulitis and abscess. CAD HTN DLD   1.  Agree with continued antibiotics. Continue NPO. 2.  Consult for IR to evaluate for possible  drain placement. 3.  Discussed with the patient that secondary to the fact that he had multiple recurrences of diverticulitis he would be a good candidate for elective sigmoidectomy once this heals.  We will have him follow back up with our colorectal surgeons to discuss surgery at that point.  He will also need a colonoscopy prior to this and 6 weeks out from this episode.  Axel Filler 12/24/2022, 6:26 AM

## 2022-12-24 NOTE — Progress Notes (Signed)
12/23/2022 2215 Received pt to room 6N-22 from ED with DX Diverticulitis.  Pt is A&O, some C/O pain in his abdomen.  Tele monitor applied and CCMD notified.  Oriented to room, call light and bed.  Call bell in reach. Kathryne Hitch

## 2022-12-24 NOTE — Progress Notes (Signed)
PROGRESS NOTE    Billy Cummings  ZOX:[id number] DOB: 12-12-1970 DOA: 12/23/2022 PCP: Don Broach, [facility]    Brief Narrative:  Billy Cummings is a 52 y.o. male with medical history significant of CAD status post PCI, hypertension, hyperlipidemia/hypertriglyceridemia, chronic leukocytosis and lymphocytosis, anxiety and depression, history of TIA, OSA on CPAP, RLS, cigarette smoking presented to the ED with a chief complaint of lower abdominal pain.     Assessment and Plan: Acute sigmoid diverticulitis with 3.6 cm abscess -needs drain but IR unable to place until 9/6 due to plavix -IR/GS following -IV abx   Incidental pulmonary nodule CT showing left solid pulmonary nodule measuring 5 mm.  He will need outpatient follow-up imaging/close monitoring given history of longstanding heavy cigarette smoking.   CAD status post multiple PCI's, most recent in 01/2021: Not endorsing any anginal symptoms at this time.  History of TIA in 2021 Hold on aspirin and Plavix    OSA on CPAP Continue nightly CPAP.   Cigarette smoking Smokes 2 packs of cigarettes daily.  He has been counseled to quit and NicoDerm patch ordered. -PRN gum   DVT prophylaxis: SCDs Start: 12/23/22 2349    Code Status: Full Code Family Communication:   Disposition Plan:  Level of care: Telemetry Medical Status is: Inpatient Remains inpatient appropriate     Consultants:  GS  Subjective: Wants a cigarette   Objective: Vitals:   12/23/22 2051 12/23/22 2237 12/24/22 0704 12/24/22 0748  BP:  117/69 110/70 107/70  Pulse:  73 70 68  Resp:  17 16 16   Temp: 98.6 F (37 C) 98.7 F (37.1 C) 98 F (36.7 C) 98.1 F (36.7 C)  TempSrc: Oral Oral Oral Oral  SpO2:  95% 93% 94%  Weight:      Height:        Intake/Output Summary (Last 24 hours) at 12/24/2022 1245 Last data filed at 12/23/2022 1941 Gross per 24 hour  Intake 999 ml  Output --  Net 999 ml   Filed Weights   12/23/22 1430  Weight: 108.9 kg     Examination:   General: Appearance:    Obese male in no acute distress     Lungs:     Clear to auscultation bilaterally, respirations unlabored  Heart:    Normal heart rate. Normal rhythm. No murmurs, rubs, or gallops.    MS:   All extremities are intact.    Neurologic:   Awake, alert, oriented x 3. No apparent focal neurological           defect.        Data Reviewed: I have personally reviewed following labs and imaging studies  CBC: Recent Labs  Lab 12/23/22 1509 12/23/22 1518 12/24/22 0752  WBC 15.2*  --  12.1*  NEUTROABS 8.9*  --   --   HGB 14.7 15.0 14.1  HCT 43.2 44.0 41.9  MCV 88.2  --  87.5  PLT 395  --  359   Basic Metabolic Panel: Recent Labs  Lab 12/23/22 1509 12/23/22 1518 12/24/22 0752  NA 135 135 137  K 4.3 4.5 3.9  CL 98 101 103  CO2 25  --  23  GLUCOSE 92 98 90  BUN 10 11 10   CREATININE 1.24 1.30* 1.14  CALCIUM 9.2  --  8.4*   GFR: Estimated Creatinine Clearance: 99.6 mL/min (by C-G formula based on SCr of 1.14 mg/dL). Liver Function Tests: Recent Labs  Lab 12/23/22 1509  AST 22  ALT 27  ALKPHOS 90  BILITOT 0.6  PROT 7.8  ALBUMIN 3.6   Recent Labs  Lab 12/23/22 1509  LIPASE 25   No results for input(s): "AMMONIA" in the last 168 hours. Coagulation Profile: Recent Labs  Lab 12/24/22 0752  INR 1.0   Cardiac Enzymes: No results for input(s): "CKTOTAL", "CKMB", "CKMBINDEX", "TROPONINI" in the last 168 hours. BNP (last 3 results) No results for input(s): "PROBNP" in the last 8760 hours. HbA1C: No results for input(s): "HGBA1C" in the last 72 hours. CBG: No results for input(s): "GLUCAP" in the last 168 hours. Lipid Profile: No results for input(s): "CHOL", "HDL", "LDLCALC", "TRIG", "CHOLHDL", "LDLDIRECT" in the last 72 hours. Thyroid Function Tests: No results for input(s): "TSH", "T4TOTAL", "FREET4", "T3FREE", "THYROIDAB" in the last 72 hours. Anemia Panel: No results for input(s): "VITAMINB12", "FOLATE",  "FERRITIN", "TIBC", "IRON", "RETICCTPCT" in the last 72 hours. Sepsis Labs: No results for input(s): "PROCALCITON", "LATICACIDVEN" in the last 168 hours.  No results found for this or any previous visit (from the past 240 hour(s)).       Radiology Studies: CT ABDOMEN PELVIS W CONTRAST  Result Date: 12/23/2022 CLINICAL DATA:  Diverticulitis with complication suspected. Left lower quadrant pain. EXAM: CT ABDOMEN AND PELVIS WITH CONTRAST TECHNIQUE: Multidetector CT imaging of the abdomen and pelvis was performed using the standard protocol following bolus administration of intravenous contrast. RADIATION DOSE REDUCTION: This exam was performed according to the departmental dose-optimization program which includes automated exposure control, adjustment of the mA and/or kV according to patient size and/or use of iterative reconstruction technique. CONTRAST:  75mL OMNIPAQUE IOHEXOL 350 MG/ML SOLN COMPARISON:  11/21/2017 FINDINGS: Lower chest: 5 mm nodule in the left lung base laterally, series 4, image 3. No change since prior study. Long-term stability is consistent with benign etiology. No imaging follow-up is indicated. Hepatobiliary: Diffuse fatty infiltration of the liver. No focal lesions. Gallbladder and bile ducts are normal. Pancreas: Unremarkable. No pancreatic ductal dilatation or surrounding inflammatory changes. Spleen: Normal in size without focal abnormality. Adrenals/Urinary Tract: Adrenal glands are unremarkable. Kidneys are normal, without renal calculi, focal lesion, or hydronephrosis. Bladder wall is mildly thickened diffusely. This may indicate reactive inflammation or cystitis. Correlate with urinalysis. Stomach/Bowel: Stomach, small bowel, and colon are not abnormally distended. Scattered colonic diverticula. There is mild infiltration around the mid sigmoid colon with a focal pericolonic collection measuring 3.7 cm diameter. This likely represents acute diverticulitis with abscess.  Follow-up after resolution of the acute process is recommended to exclude underlying neoplasm. Appendix is normal. Vascular/Lymphatic: Aortic atherosclerosis. No enlarged abdominal or pelvic lymph nodes. Reproductive: Prostate is unremarkable. Other: No free air or free fluid. Abdominal wall musculature appears intact. Musculoskeletal: No acute or significant osseous findings. IMPRESSION: 1. Sigmoid colonic diverticulosis with 3.6 cm pericolonic collection and adjacent stranding. This likely represents acute diverticulitis with abscess. Follow-up after resolution of the acute process is recommended to exclude underlying neoplasm. 2. Diffuse fatty infiltration of the liver. 3. Mild thickening of bladder wall may indicate reactive inflammation or cystitis. Correlate with urinalysis. 4. Left solid pulmonary nodule measuring 5 mm. Per Fleischner Society Guidelines, no routine follow-up imaging is recommended. These guidelines do not apply to immunocompromised patients and patients with cancer. Follow up in patients with significant comorbidities as clinically warranted. For lung cancer screening, adhere to Lung-RADS guidelines. Reference: Radiology. 2017; 284(1):228-43. 5. Aortic atherosclerosis. Electronically Signed   By: Burman Nieves M.D.   On: 12/23/2022 19:39        Scheduled Meds:  nicotine  21 mg Transdermal Daily   Continuous Infusions:  piperacillin-tazobactam (ZOSYN)  IV 3.375 g (12/24/22 1148)     LOS: 1 day    Time spent: 45 minutes spent on chart review, discussion with nursing staff, consultants, updating family and interview/physical exam; more than 50% of that time was spent in counseling and/or coordination of care.    Joseph Art, DO [facility] Available via Epic secure chat 7am-7pm After these hours, please refer to coverage provider listed on [email or web address] 12/24/2022, 12:45 PM

## 2022-12-24 NOTE — Progress Notes (Signed)
Patient has refused Cpap for the night. Patient states he does not tolerate it and does not have a home unit any longer.

## 2022-12-25 ENCOUNTER — Inpatient Hospital Stay (HOSPITAL_COMMUNITY): Payer: 59

## 2022-12-25 DIAGNOSIS — K5792 Diverticulitis of intestine, part unspecified, without perforation or abscess without bleeding: Secondary | ICD-10-CM | POA: Diagnosis not present

## 2022-12-25 LAB — CBC
HCT: 40.4 % (ref 39.0–52.0)
Hemoglobin: 13.3 g/dL (ref 13.0–17.0)
MCH: 28.4 pg (ref 26.0–34.0)
MCHC: 32.9 g/dL (ref 30.0–36.0)
MCV: 86.3 fL (ref 80.0–100.0)
Platelets: 381 10*3/uL (ref 150–400)
RBC: 4.68 MIL/uL (ref 4.22–5.81)
RDW: 13.2 % (ref 11.5–15.5)
WBC: 12.4 10*3/uL — ABNORMAL HIGH (ref 4.0–10.5)
nRBC: 0 % (ref 0.0–0.2)

## 2022-12-25 LAB — BASIC METABOLIC PANEL
Anion gap: 9 (ref 5–15)
BUN: 9 mg/dL (ref 6–20)
CO2: 24 mmol/L (ref 22–32)
Calcium: 8.7 mg/dL — ABNORMAL LOW (ref 8.9–10.3)
Chloride: 103 mmol/L (ref 98–111)
Creatinine, Ser: 1.23 mg/dL (ref 0.61–1.24)
GFR, Estimated: >60 mL/min (ref >60)
Glucose, Bld: 90 mg/dL (ref 70–99)
Potassium: 4.3 mmol/L (ref 3.5–5.1)
Sodium: 136 mmol/L (ref 135–145)

## 2022-12-25 MED ORDER — IOHEXOL 350 MG/ML SOLN
75.0000 mL | Freq: Once | INTRAVENOUS | Status: AC | PRN
  Administered 2022-12-25: 75 mL via INTRAVENOUS

## 2022-12-25 NOTE — Progress Notes (Signed)
Subjective: Doesn't feel well today.  Pain is worse, especially in the RLQ.  Burning and dysuria with voiding last night.  ROS: See above, otherwise other systems negative  Objective: Vital signs in last 24 hours: Temp:  [97.7 F (36.5 C)-98.2 F (36.8 C)] 98.2 F (36.8 C) (09/05 0441) Pulse Rate:  [68-73] 73 (09/05 0441) Resp:  [16-20] 20 (09/05 0441) BP: (106-109)/(62-72) 106/62 (09/05 0441) SpO2:  [93 %-100 %] 93 % (09/05 0441) Last BM Date : 12/22/22  Intake/Output from previous day: 09/04 0701 - 09/05 0700 In: 480 [P.O.:480] Out: -  Intake/Output this shift: No intake/output data recorded.  PE: Gen: appears to not feel well Heart: regular Lungs: CTAB Abd: soft, but very focally tender in RLQ and guarded with palpation, still tender in suprapubic region as well.  No peritonitis at this time.  Lab Results:  Recent Labs    12/23/22 1509 12/23/22 1518 12/24/22 0752  WBC 15.2*  --  12.1*  HGB 14.7 15.0 14.1  HCT 43.2 44.0 41.9  PLT 395  --  359   BMET Recent Labs    12/23/22 1509 12/23/22 1518 12/24/22 0752  NA 135 135 137  K 4.3 4.5 3.9  CL 98 101 103  CO2 25  --  23  GLUCOSE 92 98 90  BUN 10 11 10   CREATININE 1.24 1.30* 1.14  CALCIUM 9.2  --  8.4*   PT/INR Recent Labs    12/24/22 0752  LABPROT 13.5  INR 1.0   CMP     Component Value Date/Time   NA 137 12/24/2022 0752   NA 140 01/10/2022 1459   K 3.9 12/24/2022 0752   CL 103 12/24/2022 0752   CO2 23 12/24/2022 0752   GLUCOSE 90 12/24/2022 0752   BUN 10 12/24/2022 0752   BUN 12 01/10/2022 1459   CREATININE 1.14 12/24/2022 0752   CALCIUM 8.4 (L) 12/24/2022 0752   PROT 7.8 12/23/2022 1509   ALBUMIN 3.6 12/23/2022 1509   AST 22 12/23/2022 1509   ALT 27 12/23/2022 1509   ALKPHOS 90 12/23/2022 1509   BILITOT 0.6 12/23/2022 1509   GFRNONAA >60 12/24/2022 0752   Lipase     Component Value Date/Time   LIPASE 25 12/23/2022 1509       Studies/Results: CT ABDOMEN PELVIS W  CONTRAST  Result Date: 12/23/2022 CLINICAL DATA:  Diverticulitis with complication suspected. Left lower quadrant pain. EXAM: CT ABDOMEN AND PELVIS WITH CONTRAST TECHNIQUE: Multidetector CT imaging of the abdomen and pelvis was performed using the standard protocol following bolus administration of intravenous contrast. RADIATION DOSE REDUCTION: This exam was performed according to the departmental dose-optimization program which includes automated exposure control, adjustment of the mA and/or kV according to patient size and/or use of iterative reconstruction technique. CONTRAST:  75mL OMNIPAQUE IOHEXOL 350 MG/ML SOLN COMPARISON:  11/21/2017 FINDINGS: Lower chest: 5 mm nodule in the left lung base laterally, series 4, image 3. No change since prior study. Long-term stability is consistent with benign etiology. No imaging follow-up is indicated. Hepatobiliary: Diffuse fatty infiltration of the liver. No focal lesions. Gallbladder and bile ducts are normal. Pancreas: Unremarkable. No pancreatic ductal dilatation or surrounding inflammatory changes. Spleen: Normal in size without focal abnormality. Adrenals/Urinary Tract: Adrenal glands are unremarkable. Kidneys are normal, without renal calculi, focal lesion, or hydronephrosis. Bladder wall is mildly thickened diffusely. This may indicate reactive inflammation or cystitis. Correlate with urinalysis. Stomach/Bowel: Stomach, small bowel, and colon are not abnormally distended.  Scattered colonic diverticula. There is mild infiltration around the mid sigmoid colon with a focal pericolonic collection measuring 3.7 cm diameter. This likely represents acute diverticulitis with abscess. Follow-up after resolution of the acute process is recommended to exclude underlying neoplasm. Appendix is normal. Vascular/Lymphatic: Aortic atherosclerosis. No enlarged abdominal or pelvic lymph nodes. Reproductive: Prostate is unremarkable. Other: No free air or free fluid. Abdominal wall  musculature appears intact. Musculoskeletal: No acute or significant osseous findings. IMPRESSION: 1. Sigmoid colonic diverticulosis with 3.6 cm pericolonic collection and adjacent stranding. This likely represents acute diverticulitis with abscess. Follow-up after resolution of the acute process is recommended to exclude underlying neoplasm. 2. Diffuse fatty infiltration of the liver. 3. Mild thickening of bladder wall may indicate reactive inflammation or cystitis. Correlate with urinalysis. 4. Left solid pulmonary nodule measuring 5 mm. Per Fleischner Society Guidelines, no routine follow-up imaging is recommended. These guidelines do not apply to immunocompromised patients and patients with cancer. Follow up in patients with significant comorbidities as clinically warranted. For lung cancer screening, adhere to Lung-RADS guidelines. Reference: Radiology. 2017; 284(1):228-43. 5. Aortic atherosclerosis. Electronically Signed   By: Burman Nieves M.D.   On: 12/23/2022 19:39    Anti-infectives: Anti-infectives (From admission, onward)    Start     Dose/Rate Route Frequency Ordered Stop   12/24/22 0400  piperacillin-tazobactam (ZOSYN) IVPB 3.375 g       Placed in "Followed by" Linked Group   3.375 g 12.5 mL/hr over 240 Minutes Intravenous Every 8 hours 12/23/22 2032     12/23/22 2045  piperacillin-tazobactam (ZOSYN) IVPB 3.375 g       Placed in "Followed by" Linked Group   3.375 g 100 mL/hr over 30 Minutes Intravenous  Once 12/23/22 2032 12/23/22 2110        Assessment/Plan Diverticulitis with abscess/phlegmon -labs pending today -hemodynamically stable -pain is worse today though than yesterday. -discussed CT scan with IR about moving drain procedure up, but in review, this area in the pelvis looks a bit more phlegmonous than fluid.  Recommended repeat CT scan which I agree with. -will have WOC mark him for a colostomy in case we need to proceed with OR -will make NPO for now for above  reasons as well -patient agreeable to all of this.  -d/w primary service  FEN - NPO/IVFs VTE - plavix on hold, LD 9/3 ID - Zosyn  Tobacco abuse CAD HLD HTN H/O TIA OSA RLS  I reviewed hospitalist notes, last 24 h vitals and pain scores, last 48 h intake and output, last 24 h labs and trends, and last 24 h imaging results.   LOS: 2 days    Letha Cape , [facility] 12/25/2022, 8:48 AM Please see Amion for pager number during day hours 7:00am-4:30pm or 7:00am -11:30am on weekends

## 2022-12-25 NOTE — Plan of Care (Signed)

## 2022-12-25 NOTE — Consult Note (Signed)
WOC Nurse requested for preoperative stoma site marking  Discussed surgical procedure and stoma creation with patient.  Explained role of the WOC nurse team.  Provided the patient with educational booklet and provided samples of pouching options.  Answered patient questions. 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  We discuss avoiding heavy lifting and straining core muscles.    Examined patient lying, sitting, and standing in order to place the marking in the patient's visual field, away from any creases or abdominal contour issues and within the rectus muscle.  Patient wears pants just below  umbilicus, so I marked above umbilicus in LUQ and RUQ.  Marked for colostomy in the LUQ 4 cm to the left of the umbilicus and 2 cm above the umbilicus.  Marked for ileostomy in the RUQ  4 cm to the right of the umbilicus and 2 cm above the umbilicus.  Patient's abdomen cleansed with CHG wipes at site markings, allowed to air dry prior to marking.Covered mark with thin film transparent dressing to preserve mark until date of surgery.   WOC Nurse team will follow up with patient after surgery for continue ostomy care and teaching.  Mike Gip MSN, RN, FNP-BC CWON Wound, Ostomy, Continence Nurse Outpatient [facility] [phone] Pager [phone]

## 2022-12-25 NOTE — TOC Initial Note (Signed)
Transition of Care (TOC) - Initial/Assessment Note   Spoke to patient at bedside. Patient from home with girl friend .   Patient independent prior to admission has no DME.   PCP is at [facility].   TOC team will continue to follow for disposition needs Patient Details  Name: Billy Cummings MRN: [id number] Date of Birth: 02-12-1971  Transition of Care [facility]) CM/SW Contact:    Kingsley Plan, RN Phone Number: 12/25/2022, 1:01 PM  Clinical Narrative:                   Expected Discharge Plan: Home/Self Care Barriers to Discharge: Continued Medical Work up   Patient Goals and CMS Choice Patient states their goals for this hospitalization and ongoing recovery are:: to return to home          Expected Discharge Plan and Services   Discharge Planning Services: CM Consult Post Acute Care Choice: NA Living arrangements for the past 2 months: Single Family Home                 DME Arranged: N/A DME Agency: NA       HH Arranged: NA          Prior Living Arrangements/Services Living arrangements for the past 2 months: Single Family Home Lives with:: Significant Other Patient language and need for interpreter reviewed:: Yes Do you feel safe going back to the place where you live?: Yes      Need for Family Participation in Patient Care: Yes (Comment) Care giver support system in place?: Yes (comment)   Criminal Activity/Legal Involvement Pertinent to Current Situation/Hospitalization: No - Comment as needed  Activities of Daily Living Home Assistive Devices/Equipment: None ADL Screening (condition at time of admission) Patient's cognitive ability adequate to safely complete daily activities?: Yes Is the patient deaf or have difficulty hearing?: No Does the patient have difficulty seeing, even when wearing glasses/contacts?: No Does the patient have difficulty concentrating, remembering, or making decisions?: No Patient able to express need for  assistance with ADLs?: Yes Does the patient have difficulty dressing or bathing?: No Independently performs ADLs?: Yes (appropriate for developmental age) Does the patient have difficulty walking or climbing stairs?: No Weakness of Legs: None Weakness of Arms/Hands: None  Permission Sought/Granted   Permission granted to share information with : No              Emotional Assessment Appearance:: Appears stated age Attitude/Demeanor/Rapport: Engaged Affect (typically observed): Accepting Orientation: : Oriented to Self, Oriented to Place, Oriented to  Time, Oriented to Situation Alcohol / Substance Use: Not Applicable Psych Involvement: No (comment)  Admission diagnosis:  Acute diverticulitis [K57.92] Diverticulitis of large intestine with complication [K57.32] Patient Active Problem List   Diagnosis Date Noted   Acute diverticulitis 12/23/2022   Incidental pulmonary nodule 12/23/2022   Dyspnea on exertion    Angina pectoris (HCC) 01/23/2021   Hyperlipidemia 01/15/2021   Primary hypertension 01/15/2021   Obesity (BMI 30-39.9) 01/15/2021   Anxiety 01/11/2021   Arthritis 01/11/2021   Depression 01/11/2021   History of myocardial infarction 01/11/2021   Restless leg syndrome 01/11/2021   Leukocytosis 07/17/2020    Class: Chronic   OSA (obstructive sleep apnea) 06/28/2020   Essential hypertension 02/23/2020   History of TIA (transient ischemic attack) 2021   Hypertriglyceridemia 05/11/2017   Cigarette smoker 08/29/2015   Coronary artery disease involving native coronary artery of native heart without angina pectoris 08/29/2015   Dyslipidemia 08/29/2015  PCP:  [facility] Pharmacy:   [facility] - [place], [place] - [address] [place] [address] Phone: [phone] Fax: [phone]     Social Determinants of Health (SDOH) Social History: SDOH Screenings   Food Insecurity: No Food Insecurity (12/24/2022)  Housing: Low Risk   (12/24/2022)  Transportation Needs: No Transportation Needs (12/24/2022)  Utilities: Not At Risk (12/24/2022)  Tobacco Use: High Risk (12/24/2022)   SDOH Interventions:     Readmission Risk Interventions     No data to display

## 2022-12-25 NOTE — Progress Notes (Signed)
PROGRESS NOTE    Billy Cummings  UJW:[id number] DOB: May 02, 1970 DOA: 12/23/2022 PCP: Don Broach, [facility]    Brief Narrative:  Billy Cummings is a 52 y.o. male with medical history significant of CAD status post PCI, hypertension, hyperlipidemia/hypertriglyceridemia, chronic leukocytosis and lymphocytosis, anxiety and depression, history of TIA, OSA on CPAP, RLS, cigarette smoking presented to the ED with a chief complaint of lower abdominal pain.     Assessment and Plan: Acute sigmoid diverticulitis with 3.6 cm abscess -repeat CT scan for ? worsening -IR/GS following -surgery vs drain placement -plavix on hold -IV abx   Incidental pulmonary nodule CT showing left solid pulmonary nodule measuring 5 mm.  He will need outpatient follow-up imaging/close monitoring given history of longstanding heavy cigarette smoking.   CAD status post multiple PCI's, most recent in 01/2021: Not endorsing any anginal symptoms at this time.  History of TIA in 2021 Hold on aspirin and Plavix    OSA on CPAP Continue nightly CPAP.   Cigarette smoking Smokes 2 packs of cigarettes daily.  He has been counseled to quit and NicoDerm patch ordered. -PRN gum   DVT prophylaxis: SCDs Start: 12/23/22 2349    Code Status: Full Code   Disposition Plan:  Level of care: Telemetry Medical Status is: Inpatient Remains inpatient appropriate     Consultants:  GS  Subjective: Pain worse this AM after palpation  Objective: Vitals:   12/24/22 1943 12/24/22 2218 12/25/22 0441 12/25/22 0848  BP: 109/72  106/62 121/72  Pulse: 68 69 73 65  Resp: 20 17 20 16   Temp: 98 F (36.7 C)  98.2 F (36.8 C) 97.7 F (36.5 C)  TempSrc: Oral  Oral Oral  SpO2: 100% 97% 93% 96%  Weight:      Height:        Intake/Output Summary (Last 24 hours) at 12/25/2022 1150 Last data filed at 12/24/2022 1200 Gross per 24 hour  Intake 480 ml  Output --  Net 480 ml   Filed Weights   12/23/22 1430  Weight: 108.9 kg     Examination:   General: Appearance:    Obese male in no acute distress     Lungs:     respirations unlabored  Heart:    Normal heart rate. Normal rhythm. No murmurs, rubs, or gallops.    MS:   All extremities are intact.    Neurologic:   Awake, alert       Data Reviewed: I have personally reviewed following labs and imaging studies  CBC: Recent Labs  Lab 12/23/22 1509 12/23/22 1518 12/24/22 0752 12/25/22 0811  WBC 15.2*  --  12.1* 12.4*  NEUTROABS 8.9*  --   --   --   HGB 14.7 15.0 14.1 13.3  HCT 43.2 44.0 41.9 40.4  MCV 88.2  --  87.5 86.3  PLT 395  --  359 381   Basic Metabolic Panel: Recent Labs  Lab 12/23/22 1509 12/23/22 1518 12/24/22 0752 12/25/22 0811  NA 135 135 137 136  K 4.3 4.5 3.9 4.3  CL 98 101 103 103  CO2 25  --  23 24  GLUCOSE 92 98 90 90  BUN 10 11 10 9   CREATININE 1.24 1.30* 1.14 1.23  CALCIUM 9.2  --  8.4* 8.7*   GFR: Estimated Creatinine Clearance: 92.3 mL/min (by C-G formula based on SCr of 1.23 mg/dL). Liver Function Tests: Recent Labs  Lab 12/23/22 1509  AST 22  ALT 27  ALKPHOS 90  BILITOT  0.6  PROT 7.8  ALBUMIN 3.6   Recent Labs  Lab 12/23/22 1509  LIPASE 25   No results for input(s): "AMMONIA" in the last 168 hours. Coagulation Profile: Recent Labs  Lab 12/24/22 0752  INR 1.0   Cardiac Enzymes: No results for input(s): "CKTOTAL", "CKMB", "CKMBINDEX", "TROPONINI" in the last 168 hours. BNP (last 3 results) No results for input(s): "PROBNP" in the last 8760 hours. HbA1C: No results for input(s): "HGBA1C" in the last 72 hours. CBG: No results for input(s): "GLUCAP" in the last 168 hours. Lipid Profile: No results for input(s): "CHOL", "HDL", "LDLCALC", "TRIG", "CHOLHDL", "LDLDIRECT" in the last 72 hours. Thyroid Function Tests: No results for input(s): "TSH", "T4TOTAL", "FREET4", "T3FREE", "THYROIDAB" in the last 72 hours. Anemia Panel: No results for input(s): "VITAMINB12", "FOLATE", "FERRITIN", "TIBC",  "IRON", "RETICCTPCT" in the last 72 hours. Sepsis Labs: No results for input(s): "PROCALCITON", "LATICACIDVEN" in the last 168 hours.  No results found for this or any previous visit (from the past 240 hour(s)).       Radiology Studies: CT ABDOMEN PELVIS W CONTRAST  Result Date: 12/23/2022 CLINICAL DATA:  Diverticulitis with complication suspected. Left lower quadrant pain. EXAM: CT ABDOMEN AND PELVIS WITH CONTRAST TECHNIQUE: Multidetector CT imaging of the abdomen and pelvis was performed using the standard protocol following bolus administration of intravenous contrast. RADIATION DOSE REDUCTION: This exam was performed according to the departmental dose-optimization program which includes automated exposure control, adjustment of the mA and/or kV according to patient size and/or use of iterative reconstruction technique. CONTRAST:  75mL OMNIPAQUE IOHEXOL 350 MG/ML SOLN COMPARISON:  11/21/2017 FINDINGS: Lower chest: 5 mm nodule in the left lung base laterally, series 4, image 3. No change since prior study. Long-term stability is consistent with benign etiology. No imaging follow-up is indicated. Hepatobiliary: Diffuse fatty infiltration of the liver. No focal lesions. Gallbladder and bile ducts are normal. Pancreas: Unremarkable. No pancreatic ductal dilatation or surrounding inflammatory changes. Spleen: Normal in size without focal abnormality. Adrenals/Urinary Tract: Adrenal glands are unremarkable. Kidneys are normal, without renal calculi, focal lesion, or hydronephrosis. Bladder wall is mildly thickened diffusely. This may indicate reactive inflammation or cystitis. Correlate with urinalysis. Stomach/Bowel: Stomach, small bowel, and colon are not abnormally distended. Scattered colonic diverticula. There is mild infiltration around the mid sigmoid colon with a focal pericolonic collection measuring 3.7 cm diameter. This likely represents acute diverticulitis with abscess. Follow-up after  resolution of the acute process is recommended to exclude underlying neoplasm. Appendix is normal. Vascular/Lymphatic: Aortic atherosclerosis. No enlarged abdominal or pelvic lymph nodes. Reproductive: Prostate is unremarkable. Other: No free air or free fluid. Abdominal wall musculature appears intact. Musculoskeletal: No acute or significant osseous findings. IMPRESSION: 1. Sigmoid colonic diverticulosis with 3.6 cm pericolonic collection and adjacent stranding. This likely represents acute diverticulitis with abscess. Follow-up after resolution of the acute process is recommended to exclude underlying neoplasm. 2. Diffuse fatty infiltration of the liver. 3. Mild thickening of bladder wall may indicate reactive inflammation or cystitis. Correlate with urinalysis. 4. Left solid pulmonary nodule measuring 5 mm. Per Fleischner Society Guidelines, no routine follow-up imaging is recommended. These guidelines do not apply to immunocompromised patients and patients with cancer. Follow up in patients with significant comorbidities as clinically warranted. For lung cancer screening, adhere to Lung-RADS guidelines. Reference: Radiology. 2017; 284(1):228-43. 5. Aortic atherosclerosis. Electronically Signed   By: Burman Nieves M.D.   On: 12/23/2022 19:39        Scheduled Meds:  nicotine  21 mg Transdermal  Daily   Continuous Infusions:  piperacillin-tazobactam (ZOSYN)  IV 3.375 g (12/25/22 0446)     LOS: 2 days    Time spent: 45 minutes spent on chart review, discussion with nursing staff, consultants, updating family and interview/physical exam; more than 50% of that time was spent in counseling and/or coordination of care.    Joseph Art, DO [facility] Available via Epic secure chat 7am-7pm After these hours, please refer to coverage provider listed on [email or web address] 12/25/2022, 11:50 AM

## 2022-12-26 ENCOUNTER — Inpatient Hospital Stay (HOSPITAL_COMMUNITY): Payer: 59

## 2022-12-26 DIAGNOSIS — K5792 Diverticulitis of intestine, part unspecified, without perforation or abscess without bleeding: Secondary | ICD-10-CM | POA: Diagnosis not present

## 2022-12-26 LAB — CBC
HCT: 43.4 % (ref 39.0–52.0)
Hemoglobin: 14.2 g/dL (ref 13.0–17.0)
MCH: 28.7 pg (ref 26.0–34.0)
MCHC: 32.7 g/dL (ref 30.0–36.0)
MCV: 87.9 fL (ref 80.0–100.0)
Platelets: 445 10*3/uL — ABNORMAL HIGH (ref 150–400)
RBC: 4.94 MIL/uL (ref 4.22–5.81)
RDW: 13.1 % (ref 11.5–15.5)
WBC: 13.1 10*3/uL — ABNORMAL HIGH (ref 4.0–10.5)
nRBC: 0 % (ref 0.0–0.2)

## 2022-12-26 LAB — BASIC METABOLIC PANEL
Anion gap: 16 — ABNORMAL HIGH (ref 5–15)
BUN: 9 mg/dL (ref 6–20)
CO2: 22 mmol/L (ref 22–32)
Calcium: 9 mg/dL (ref 8.9–10.3)
Chloride: 99 mmol/L (ref 98–111)
Creatinine, Ser: 1.31 mg/dL — ABNORMAL HIGH (ref 0.61–1.24)
GFR, Estimated: >60 mL/min (ref >60)
Glucose, Bld: 90 mg/dL (ref 70–99)
Potassium: 4.3 mmol/L (ref 3.5–5.1)
Sodium: 137 mmol/L (ref 135–145)

## 2022-12-26 MED ORDER — FENTANYL CITRATE (PF) 100 MCG/2ML IJ SOLN
INTRAMUSCULAR | Status: AC
  Filled 2022-12-26: qty 4

## 2022-12-26 MED ORDER — SODIUM CHLORIDE 0.9% FLUSH
5.0000 mL | Freq: Three times a day (TID) | INTRAVENOUS | Status: DC
  Administered 2022-12-26 – 2022-12-28 (×6): 5 mL

## 2022-12-26 MED ORDER — MIDAZOLAM HCL 2 MG/2ML IJ SOLN
INTRAMUSCULAR | Status: AC
  Filled 2022-12-26: qty 4

## 2022-12-26 MED ORDER — FENTANYL CITRATE (PF) 100 MCG/2ML IJ SOLN
INTRAMUSCULAR | Status: AC | PRN
Start: 2022-12-26 — End: 2022-12-26
  Administered 2022-12-26 (×4): 50 ug via INTRAVENOUS

## 2022-12-26 MED ORDER — LIDOCAINE HCL (PF) 1 % IJ SOLN
10.0000 mL | Freq: Once | INTRAMUSCULAR | Status: AC
  Administered 2022-12-26: 10 mL

## 2022-12-26 MED ORDER — MIDAZOLAM HCL 2 MG/2ML IJ SOLN
INTRAMUSCULAR | Status: AC | PRN
Start: 2022-12-26 — End: 2022-12-26
  Administered 2022-12-26 (×4): 1 mg via INTRAVENOUS

## 2022-12-26 NOTE — Plan of Care (Signed)

## 2022-12-26 NOTE — Progress Notes (Signed)
[facility] Progress Note     Subjective: CC-  Sore after drain placement this morning. States that his abdominal pain was getting a little better yesterday afternoon. VSS. WBC slightly up 13.1, afebrile.  Objective: Vital signs in last 24 hours: Temp:  [97.8 F (36.6 C)-98.4 F (36.9 C)] 97.8 F (36.6 C) (09/06 1029) Pulse Rate:  [55-72] 62 (09/06 1029) Resp:  [14-20] 16 (09/06 1029) BP: (100-129)/(62-82) 107/64 (09/06 1029) SpO2:  [92 %-100 %] 95 % (09/06 1029) Last BM Date : 12/22/22  Intake/Output from previous day: 09/05 0701 - 09/06 0700 In: 525.5 [P.O.:240; IV Piggyback:285.5] Out: -  Intake/Output this shift: Total I/O In: -  Out: 25 [Drains:25]  PE: Gen: Alert, NAD Heart: regular Lungs: CTAB Abd: soft, nondistended, TTP suprapubic region without rebound or guarding/ no peritonitis. Drain with small amount of bloody fluid in bulb  Lab Results:  Recent Labs    12/25/22 0811 12/26/22 0710  WBC 12.4* 13.1*  HGB 13.3 14.2  HCT 40.4 43.4  PLT 381 445*   BMET Recent Labs    12/25/22 0811 12/26/22 0710  NA 136 137  K 4.3 4.3  CL 103 99  CO2 24 22  GLUCOSE 90 90  BUN 9 9  CREATININE 1.23 1.31*  CALCIUM 8.7* 9.0   PT/INR Recent Labs    12/24/22 0752  LABPROT 13.5  INR 1.0   CMP     Component Value Date/Time   NA 137 12/26/2022 0710   NA 140 01/10/2022 1459   K 4.3 12/26/2022 0710   CL 99 12/26/2022 0710   CO2 22 12/26/2022 0710   GLUCOSE 90 12/26/2022 0710   BUN 9 12/26/2022 0710   BUN 12 01/10/2022 1459   CREATININE 1.31 (H) 12/26/2022 0710   CALCIUM 9.0 12/26/2022 0710   PROT 7.8 12/23/2022 1509   ALBUMIN 3.6 12/23/2022 1509   AST 22 12/23/2022 1509   ALT 27 12/23/2022 1509   ALKPHOS 90 12/23/2022 1509   BILITOT 0.6 12/23/2022 1509   GFRNONAA >60 12/26/2022 0710   Lipase     Component Value Date/Time   LIPASE 25 12/23/2022 1509       Studies/Results: CT ABDOMEN PELVIS W CONTRAST  Result Date:  12/25/2022 CLINICAL DATA:  Diverticulitis, complication suspected EXAM: CT ABDOMEN AND PELVIS WITH CONTRAST TECHNIQUE: Multidetector CT imaging of the abdomen and pelvis was performed using the standard protocol following bolus administration of intravenous contrast. RADIATION DOSE REDUCTION: This exam was performed according to the departmental dose-optimization program which includes automated exposure control, adjustment of the mA and/or kV according to patient size and/or use of iterative reconstruction technique. CONTRAST:  75mL OMNIPAQUE IOHEXOL 350 MG/ML SOLN COMPARISON:  12/23/2019 FINDINGS: Lower chest: Mosaic attenuation within the bilateral lung bases, similar to prior. Stable benign 5 mm subpleural pulmonary nodule in the left lower lobe (series 7, image 1), which does not require follow-up imaging. No new abnormalities. Heart size within normal limits. Hepatobiliary: Hepatomegaly and hepatic steatosis. No focal liver lesion. Unremarkable gallbladder. No hyperdense gallstone. No biliary dilatation. Pancreas: Unremarkable. No pancreatic ductal dilatation or surrounding inflammatory changes. Spleen: Normal in size without focal abnormality. Adrenals/Urinary Tract: Unremarkable adrenal glands. Kidneys enhance symmetrically without focal lesion, stone, or hydronephrosis. Ureters are nondilated. Prominent wall thickening at the bladder dome adjacent to the sigmoid colon inflammation. No air within the bladder to suggest a colovesical fistula. Stomach/Bowel: Redemonstration of a focally inflamed segment of mid sigmoid colon which contains numerous diverticula. Adjacent fluid and air  containing collection measures approximately 3.7 x 2.8 x 3.6 cm, similar in size to the recent prior study. No new collections. No extraluminal air. No bowel obstruction. Normal appendix. Stomach is unremarkable. Vascular/Lymphatic: Aortic atherosclerosis. No enlarged abdominal or pelvic lymph nodes. Reproductive: Prostate is  unremarkable. Other: No ascites or pneumoperitoneum.  No abdominal wall hernia. Musculoskeletal: No new or acute bony abnormality. Degenerative disc disease of L5-S1. IMPRESSION: 1. Little interval change in the appearance of acute sigmoid diverticulitis with pericolonic abscess measuring approximately 3.7 x 2.8 x 3.6 cm. No extraluminal air. No new collections. 2. Reactive wall thickening at the bladder dome adjacent to the sigmoid colon inflammation. No air within the bladder to suggest a colovesical fistula. 3. Hepatomegaly and hepatic steatosis. 4. Mosaic attenuation within the bilateral lung bases, similar to prior. Findings are nonspecific but can be seen in the setting of small airways disease. 5. Aortic atherosclerosis (ICD10-I70.0). Electronically Signed   By: Duanne Guess D.O.   On: 12/25/2022 12:09    Anti-infectives: Anti-infectives (From admission, onward)    Start     Dose/Rate Route Frequency Ordered Stop   12/24/22 0400  piperacillin-tazobactam (ZOSYN) IVPB 3.375 g       Placed in "Followed by" Linked Group   3.375 g 12.5 mL/hr over 240 Minutes Intravenous Every 8 hours 12/23/22 2032     12/23/22 2045  piperacillin-tazobactam (ZOSYN) IVPB 3.375 g       Placed in "Followed by" Linked Group   3.375 g 100 mL/hr over 30 Minutes Intravenous  Once 12/23/22 2032 12/23/22 2110        Assessment/Plan Diverticulitis with abscess/phlegmon -s/p IR drain placement this morning 9/6. Culture pending -Continue drain and IV antibiotics. Ok for clear liquids. Labs in AM.   FEN - CLD/IVFs VTE - plavix on hold, LD 9/3 ID - Zosyn   AKI Tobacco abuse CAD HLD HTN H/O TIA OSA RLS  I reviewed last 24 h vitals and pain scores, last 48 h intake and output, last 24 h labs and trends, and last 24 h imaging results.    LOS: 3 days    Franne Forts, [facility] 12/26/2022, 11:07 AM Please see Amion for pager number during day hours 7:00am-4:30pm

## 2022-12-26 NOTE — Progress Notes (Signed)
PROGRESS NOTE    Billy Cummings  ZOX:[id number] DOB: Apr 18, 1971 DOA: 12/23/2022 PCP: Don Broach, [facility]    Brief Narrative:  Billy Cummings is a 52 y.o. male with medical history significant of CAD status post PCI, hypertension, hyperlipidemia/hypertriglyceridemia, chronic leukocytosis and lymphocytosis, anxiety and depression, history of TIA, OSA on CPAP, RLS, cigarette smoking presented to the ED with a chief complaint of lower abdominal pain.  Found to have ? Abscess-- drain placed by IR--- GS following   Assessment and Plan: Acute sigmoid diverticulitis with 3.6 cm abscess -repeat CT scan for ? worsening -IR/GS followingt -plavix on hold -IV abx   Incidental pulmonary nodule CT showing left solid pulmonary nodule measuring 5 mm.  He will need outpatient follow-up imaging/close monitoring given history of longstanding heavy cigarette smoking.   CAD status post multiple PCI's, most recent in 01/2021: Not endorsing any anginal symptoms at this time.  History of TIA in 2021 Hold on aspirin and Plavix    OSA on CPAP Continue nightly CPAP.   Cigarette smoking Smokes 2 packs of cigarettes daily.  He has been counseled to quit and NicoDerm patch ordered. -PRN gum   DVT prophylaxis: SCDs Start: 12/23/22 2349    Code Status: Full Code   Disposition Plan:  Level of care: Telemetry Medical Status is: Inpatient Remains inpatient appropriate     Consultants:  GS  Subjective: Still having pain  Objective: Vitals:   12/26/22 0915 12/26/22 0920 12/26/22 0925 12/26/22 1029  BP: 122/80 114/79 107/76 107/64  Pulse: 71 68 70 62  Resp: 17 17 15 16   Temp:    97.8 F (36.6 C)  TempSrc:    Oral  SpO2: 93% 95% 92% 95%  Weight:      Height:        Intake/Output Summary (Last 24 hours) at 12/26/2022 1211 Last data filed at 12/26/2022 8119 Gross per 24 hour  Intake 525.48 ml  Output 25 ml  Net 500.48 ml   Filed Weights   12/23/22 1430  Weight: 108.9 kg     Examination:   General: Appearance:    Obese male in no acute distress     Lungs:     respirations unlabored  Heart:    Normal heart rate.     MS:   All extremities are intact.    Neurologic:   Awake, alert       Data Reviewed: I have personally reviewed following labs and imaging studies  CBC: Recent Labs  Lab 12/23/22 1509 12/23/22 1518 12/24/22 0752 12/25/22 0811 12/26/22 0710  WBC 15.2*  --  12.1* 12.4* 13.1*  NEUTROABS 8.9*  --   --   --   --   HGB 14.7 15.0 14.1 13.3 14.2  HCT 43.2 44.0 41.9 40.4 43.4  MCV 88.2  --  87.5 86.3 87.9  PLT 395  --  359 381 445*   Basic Metabolic Panel: Recent Labs  Lab 12/23/22 1509 12/23/22 1518 12/24/22 0752 12/25/22 0811 12/26/22 0710  NA 135 135 137 136 137  K 4.3 4.5 3.9 4.3 4.3  CL 98 101 103 103 99  CO2 25  --  23 24 22   GLUCOSE 92 98 90 90 90  BUN 10 11 10 9 9   CREATININE 1.24 1.30* 1.14 1.23 1.31*  CALCIUM 9.2  --  8.4* 8.7* 9.0   GFR: Estimated Creatinine Clearance: 86.7 mL/min (A) (by C-G formula based on SCr of 1.31 mg/dL (H)). Liver Function Tests: Recent Labs  Lab 12/23/22 1509  AST 22  ALT 27  ALKPHOS 90  BILITOT 0.6  PROT 7.8  ALBUMIN 3.6   Recent Labs  Lab 12/23/22 1509  LIPASE 25   No results for input(s): "AMMONIA" in the last 168 hours. Coagulation Profile: Recent Labs  Lab 12/24/22 0752  INR 1.0   Cardiac Enzymes: No results for input(s): "CKTOTAL", "CKMB", "CKMBINDEX", "TROPONINI" in the last 168 hours. BNP (last 3 results) No results for input(s): "PROBNP" in the last 8760 hours. HbA1C: No results for input(s): "HGBA1C" in the last 72 hours. CBG: No results for input(s): "GLUCAP" in the last 168 hours. Lipid Profile: No results for input(s): "CHOL", "HDL", "LDLCALC", "TRIG", "CHOLHDL", "LDLDIRECT" in the last 72 hours. Thyroid Function Tests: No results for input(s): "TSH", "T4TOTAL", "FREET4", "T3FREE", "THYROIDAB" in the last 72 hours. Anemia Panel: No results for  input(s): "VITAMINB12", "FOLATE", "FERRITIN", "TIBC", "IRON", "RETICCTPCT" in the last 72 hours. Sepsis Labs: No results for input(s): "PROCALCITON", "LATICACIDVEN" in the last 168 hours.  No results found for this or any previous visit (from the past 240 hour(s)).       Radiology Studies: CT ABDOMEN PELVIS W CONTRAST  Result Date: 12/25/2022 CLINICAL DATA:  Diverticulitis, complication suspected EXAM: CT ABDOMEN AND PELVIS WITH CONTRAST TECHNIQUE: Multidetector CT imaging of the abdomen and pelvis was performed using the standard protocol following bolus administration of intravenous contrast. RADIATION DOSE REDUCTION: This exam was performed according to the departmental dose-optimization program which includes automated exposure control, adjustment of the mA and/or kV according to patient size and/or use of iterative reconstruction technique. CONTRAST:  75mL OMNIPAQUE IOHEXOL 350 MG/ML SOLN COMPARISON:  12/23/2019 FINDINGS: Lower chest: Mosaic attenuation within the bilateral lung bases, similar to prior. Stable benign 5 mm subpleural pulmonary nodule in the left lower lobe (series 7, image 1), which does not require follow-up imaging. No new abnormalities. Heart size within normal limits. Hepatobiliary: Hepatomegaly and hepatic steatosis. No focal liver lesion. Unremarkable gallbladder. No hyperdense gallstone. No biliary dilatation. Pancreas: Unremarkable. No pancreatic ductal dilatation or surrounding inflammatory changes. Spleen: Normal in size without focal abnormality. Adrenals/Urinary Tract: Unremarkable adrenal glands. Kidneys enhance symmetrically without focal lesion, stone, or hydronephrosis. Ureters are nondilated. Prominent wall thickening at the bladder dome adjacent to the sigmoid colon inflammation. No air within the bladder to suggest a colovesical fistula. Stomach/Bowel: Redemonstration of a focally inflamed segment of mid sigmoid colon which contains numerous diverticula. Adjacent  fluid and air containing collection measures approximately 3.7 x 2.8 x 3.6 cm, similar in size to the recent prior study. No new collections. No extraluminal air. No bowel obstruction. Normal appendix. Stomach is unremarkable. Vascular/Lymphatic: Aortic atherosclerosis. No enlarged abdominal or pelvic lymph nodes. Reproductive: Prostate is unremarkable. Other: No ascites or pneumoperitoneum.  No abdominal wall hernia. Musculoskeletal: No new or acute bony abnormality. Degenerative disc disease of L5-S1. IMPRESSION: 1. Little interval change in the appearance of acute sigmoid diverticulitis with pericolonic abscess measuring approximately 3.7 x 2.8 x 3.6 cm. No extraluminal air. No new collections. 2. Reactive wall thickening at the bladder dome adjacent to the sigmoid colon inflammation. No air within the bladder to suggest a colovesical fistula. 3. Hepatomegaly and hepatic steatosis. 4. Mosaic attenuation within the bilateral lung bases, similar to prior. Findings are nonspecific but can be seen in the setting of small airways disease. 5. Aortic atherosclerosis (ICD10-I70.0). Electronically Signed   By: Duanne Guess D.O.   On: 12/25/2022 12:09        Scheduled Meds:  nicotine  21 mg Transdermal Daily   Continuous Infusions:  piperacillin-tazobactam (ZOSYN)  IV 3.375 g (12/26/22 1018)     LOS: 3 days    Time spent: 45 minutes spent on chart review, discussion with nursing staff, consultants, updating family and interview/physical exam; more than 50% of that time was spent in counseling and/or coordination of care.    Joseph Art, DO [facility] Available via Epic secure chat 7am-7pm After these hours, please refer to coverage provider listed on [email or web address] 12/26/2022, 12:11 PM

## 2022-12-26 NOTE — Plan of Care (Signed)
  Problem: Education: Goal: Knowledge of General Education information will improve Description: Including pain rating scale, medication(s)/side effects and non-pharmacologic comfort measures Outcome: Progressing   Problem: Health Behavior/Discharge Planning: Goal: Ability to manage health-related needs will improve Outcome: Progressing   Problem: Clinical Measurements: Goal: Will remain free from infection Outcome: Progressing   Problem: Activity: Goal: Risk for activity intolerance will decrease Outcome: Progressing   Problem: Nutrition: Goal: Adequate nutrition will be maintained Outcome: Progressing   Problem: Coping: Goal: Level of anxiety will decrease Outcome: Progressing   Problem: Elimination: Goal: Will not experience complications related to bowel motility Outcome: Progressing Goal: Will not experience complications related to urinary retention Outcome: Progressing   Problem: Pain Managment: Goal: General experience of comfort will improve Outcome: Progressing   Problem: Safety: Goal: Ability to remain free from injury will improve Outcome: Progressing   Problem: Skin Integrity: Goal: Risk for impaired skin integrity will decrease Outcome: Progressing   

## 2022-12-26 NOTE — Procedures (Signed)
  Procedure:  CT guided LLQ abscess drain placement 25ml purulent for GS, C&S Preprocedure diagnosis: The encounter diagnosis was Diverticulitis of large intestine with complication. Postprocedure diagnosis: same EBL:    minimal Complications:   none immediate  See full dictation in [employer].  Thora Lance MD Main # [phone] Pager  [phone] Mobile [phone]

## 2022-12-27 ENCOUNTER — Other Ambulatory Visit (HOSPITAL_COMMUNITY): Payer: Self-pay

## 2022-12-27 DIAGNOSIS — K5792 Diverticulitis of intestine, part unspecified, without perforation or abscess without bleeding: Secondary | ICD-10-CM | POA: Diagnosis not present

## 2022-12-27 LAB — CBC
HCT: 42 % (ref 39.0–52.0)
Hemoglobin: 14 g/dL (ref 13.0–17.0)
MCH: 29.2 pg (ref 26.0–34.0)
MCHC: 33.3 g/dL (ref 30.0–36.0)
MCV: 87.7 fL (ref 80.0–100.0)
Platelets: 414 K/uL — ABNORMAL HIGH (ref 150–400)
RBC: 4.79 MIL/uL (ref 4.22–5.81)
RDW: 13.1 % (ref 11.5–15.5)
WBC: 12.5 K/uL — ABNORMAL HIGH (ref 4.0–10.5)
nRBC: 0 % (ref 0.0–0.2)

## 2022-12-27 LAB — BASIC METABOLIC PANEL
Anion gap: 13 (ref 5–15)
BUN: 10 mg/dL (ref 6–20)
CO2: 23 mmol/L (ref 22–32)
Calcium: 8.8 mg/dL — ABNORMAL LOW (ref 8.9–10.3)
Chloride: 100 mmol/L (ref 98–111)
Creatinine, Ser: 1.26 mg/dL — ABNORMAL HIGH (ref 0.61–1.24)
GFR, Estimated: >60 mL/min (ref >60)
Glucose, Bld: 93 mg/dL (ref 70–99)
Potassium: 3.8 mmol/L (ref 3.5–5.1)
Sodium: 136 mmol/L (ref 135–145)

## 2022-12-27 MED ORDER — ORAL CARE MOUTH RINSE: 15.0000 mL | OROMUCOSAL | Status: DC | PRN

## 2022-12-27 MED ORDER — SODIUM CHLORIDE FLUSH 0.9 % IV SOLN
INTRAVENOUS | 2 refills | Status: DC
  Filled 2022-12-27: qty 200, 40d supply, fill #0

## 2022-12-27 NOTE — Progress Notes (Addendum)
Pharmacy Antibiotic Note  Billy Cummings is a 52 y.o. male admitted on 12/23/2022 with acute diverticulitis with abscess.  Pharmacy has been consulted for Zosyn dosing. Status post intra-abdominal drain placed by IR on 9/6. Abscess cultures still being assessed.    Scr: 1.26   Plan: Continue Zosyn 3.375g IV q8h (4 hour infusion). F/u on Cultures  Monitor CBC  Height: 6\' 2"  (188 cm) Weight: 108.9 kg (240 lb) IBW/kg (Calculated) : 82.2  Temp (24hrs), Avg:98 F (36.7 C), Min:97.7 F (36.5 C), Max:98.5 F (36.9 C)  Recent Labs  Lab 12/23/22 1509 12/23/22 1518 12/24/22 0752 12/25/22 0811 12/26/22 0710 12/27/22 0600  WBC 15.2*  --  12.1* 12.4* 13.1* 12.5*  CREATININE 1.24 1.30* 1.14 1.23 1.31* 1.26*    Estimated Creatinine Clearance: 90.1 mL/min (A) (by C-G formula based on SCr of 1.26 mg/dL (H)).    No Known Allergies  Antimicrobials this admission: 9/3 Zosyn>>  Dose adjustments this admission: N/a   Microbiology results: 9/6- abd fluid abscess cx- ip  Thank you for allowing pharmacy to be a part of this patient's care.  Merla Riches 12/27/2022 12:47 PM

## 2022-12-27 NOTE — Discharge Instructions (Signed)
Please flush drain once daily with 5 mL sterile saline and record daily drain output.  Low fiber diet for 4 weeks and then switch to a high fiber diet

## 2022-12-27 NOTE — Progress Notes (Signed)
[facility] Progress Note     Subjective: Sore around drain, but feeling better overall.  Tolerating CLD.  Wanting to go home  Objective: Vital signs in last 24 hours: Temp:  [97.7 F (36.5 C)-98.5 F (36.9 C)] 97.9 F (36.6 C) (09/07 0818) Pulse Rate:  [58-76] 58 (09/07 0818) Resp:  [15-18] 16 (09/07 0818) BP: (107-129)/(64-80) 129/75 (09/07 0818) SpO2:  [92 %-98 %] 93 % (09/07 0818) Last BM Date : 12/22/22  Intake/Output from previous day: 09/06 0701 - 09/07 0700 In: 1200 [P.O.:1200] Out: 55 [Drains:55] Intake/Output this shift: No intake/output data recorded.  PE: Gen: Alert, NAD Heart: regular Lungs: CTAB Abd: soft, nondistended, TTP suprapubic region without rebound or guarding/ no peritonitis. Drain with small amount of bloody fluid in bulb as well as some scant debris that is concerning for possible fistula.  Lab Results:  Recent Labs    12/26/22 0710 12/27/22 0600  WBC 13.1* 12.5*  HGB 14.2 14.0  HCT 43.4 42.0  PLT 445* 414*   BMET Recent Labs    12/26/22 0710 12/27/22 0600  NA 137 136  K 4.3 3.8  CL 99 100  CO2 22 23  GLUCOSE 90 93  BUN 9 10  CREATININE 1.31* 1.26*  CALCIUM 9.0 8.8*   PT/INR No results for input(s): "LABPROT", "INR" in the last 72 hours.  CMP     Component Value Date/Time   NA 136 12/27/2022 0600   NA 140 01/10/2022 1459   K 3.8 12/27/2022 0600   CL 100 12/27/2022 0600   CO2 23 12/27/2022 0600   GLUCOSE 93 12/27/2022 0600   BUN 10 12/27/2022 0600   BUN 12 01/10/2022 1459   CREATININE 1.26 (H) 12/27/2022 0600   CALCIUM 8.8 (L) 12/27/2022 0600   PROT 7.8 12/23/2022 1509   ALBUMIN 3.6 12/23/2022 1509   AST 22 12/23/2022 1509   ALT 27 12/23/2022 1509   ALKPHOS 90 12/23/2022 1509   BILITOT 0.6 12/23/2022 1509   GFRNONAA >60 12/27/2022 0600   Lipase     Component Value Date/Time   LIPASE 25 12/23/2022 1509       Studies/Results: CT GUIDED PERITONEAL/RETROPERITONEAL FLUID DRAIN BY PERC  CATH  Result Date: 12/26/2022 CLINICAL DATA:  Diverticular abscess EXAM: CT GUIDED DRAINAGE OF PELVIC ABSCESS ANESTHESIA/SEDATION: Intravenous Fentanyl and Versed 4mg  were administered as conscious sedation during continuous monitoring of the patient's level of consciousness and physiological / cardiorespiratory status by the radiology RN, with a total moderate sedation time of 22 minutes. PROCEDURE: The procedure, risks, benefits, and alternatives were explained to the patient. Questions regarding the procedure were encouraged and answered. The patient understands and consents to the procedure. Select axial scans through the pelvis were obtained. The complex low anterior pelvic collection was localized and an appropriate skin entry site was determined and marked. The operative field was prepped with chlorhexidinein a sterile fashion, and a sterile drape was applied covering the operative field. A sterile gown and sterile gloves were used for the procedure. Local anesthesia was provided with 1% Lidocaine. Under CT fluoroscopic guidance, 18 gauge trocar needle advanced to the collection. Purulent material could be aspirated. Guidewire advanced easily within the collection, confirmed on CT. Tract dilated to facilitate placement 12 French pigtail drain catheter, formed centrally within the collection. 25 mL purulent aspirate sent for Gram stain and culture. Catheter secured externally with 0 Prolene suture and StatLock and placed to bulb drainage. The patient tolerated the procedure well. RADIATION DOSE REDUCTION: This exam  was performed according to the departmental dose-optimization program which includes automated exposure control, adjustment of the mA and/or kV according to patient size and/or use of iterative reconstruction technique. COMPLICATIONS: None immediate FINDINGS: Complex anterior pelvic process was localized. 12 French pigtail drain catheter placed as above. 25 mL purulent aspirate sent for Gram  stain and culture. IMPRESSION: 1. Technically successful CT-guided pelvic abscess drain catheter placement Electronically Signed   By: Corlis Leak M.D.   On: 12/26/2022 17:16   CT ABDOMEN PELVIS W CONTRAST  Result Date: 12/25/2022 CLINICAL DATA:  Diverticulitis, complication suspected EXAM: CT ABDOMEN AND PELVIS WITH CONTRAST TECHNIQUE: Multidetector CT imaging of the abdomen and pelvis was performed using the standard protocol following bolus administration of intravenous contrast. RADIATION DOSE REDUCTION: This exam was performed according to the departmental dose-optimization program which includes automated exposure control, adjustment of the mA and/or kV according to patient size and/or use of iterative reconstruction technique. CONTRAST:  75mL OMNIPAQUE IOHEXOL 350 MG/ML SOLN COMPARISON:  12/23/2019 FINDINGS: Lower chest: Mosaic attenuation within the bilateral lung bases, similar to prior. Stable benign 5 mm subpleural pulmonary nodule in the left lower lobe (series 7, image 1), which does not require follow-up imaging. No new abnormalities. Heart size within normal limits. Hepatobiliary: Hepatomegaly and hepatic steatosis. No focal liver lesion. Unremarkable gallbladder. No hyperdense gallstone. No biliary dilatation. Pancreas: Unremarkable. No pancreatic ductal dilatation or surrounding inflammatory changes. Spleen: Normal in size without focal abnormality. Adrenals/Urinary Tract: Unremarkable adrenal glands. Kidneys enhance symmetrically without focal lesion, stone, or hydronephrosis. Ureters are nondilated. Prominent wall thickening at the bladder dome adjacent to the sigmoid colon inflammation. No air within the bladder to suggest a colovesical fistula. Stomach/Bowel: Redemonstration of a focally inflamed segment of mid sigmoid colon which contains numerous diverticula. Adjacent fluid and air containing collection measures approximately 3.7 x 2.8 x 3.6 cm, similar in size to the recent prior study. No  new collections. No extraluminal air. No bowel obstruction. Normal appendix. Stomach is unremarkable. Vascular/Lymphatic: Aortic atherosclerosis. No enlarged abdominal or pelvic lymph nodes. Reproductive: Prostate is unremarkable. Other: No ascites or pneumoperitoneum.  No abdominal wall hernia. Musculoskeletal: No new or acute bony abnormality. Degenerative disc disease of L5-S1. IMPRESSION: 1. Little interval change in the appearance of acute sigmoid diverticulitis with pericolonic abscess measuring approximately 3.7 x 2.8 x 3.6 cm. No extraluminal air. No new collections. 2. Reactive wall thickening at the bladder dome adjacent to the sigmoid colon inflammation. No air within the bladder to suggest a colovesical fistula. 3. Hepatomegaly and hepatic steatosis. 4. Mosaic attenuation within the bilateral lung bases, similar to prior. Findings are nonspecific but can be seen in the setting of small airways disease. 5. Aortic atherosclerosis (ICD10-I70.0). Electronically Signed   By: Duanne Guess D.O.   On: 12/25/2022 12:09    Anti-infectives: Anti-infectives (From admission, onward)    Start     Dose/Rate Route Frequency Ordered Stop   12/24/22 0400  piperacillin-tazobactam (ZOSYN) IVPB 3.375 g       Placed in "Followed by" Linked Group   3.375 g 12.5 mL/hr over 240 Minutes Intravenous Every 8 hours 12/23/22 2032     12/23/22 2045  piperacillin-tazobactam (ZOSYN) IVPB 3.375 g       Placed in "Followed by" Linked Group   3.375 g 100 mL/hr over 30 Minutes Intravenous  Once 12/23/22 2032 12/23/22 2110        Assessment/Plan Diverticulitis with abscess/phlegmon -s/p IR drain placement 9/6. Culture pending -Continue drain and IV antibiotics.  -  Ok for FLD, soft for dinner -monitor WBC.  Stable around 12K right now. -d/w primary at bedside -if continues to improve, can likely DC home tomorrow with IR and surgical follow up -can likely resume plavix at this piont   FEN - FLD, soft/IVFs VTE -  plavix on hold, LD 9/3, may resume ID - Zosyn   AKI Tobacco abuse CAD HLD HTN H/O TIA OSA RLS  I reviewed hospitalist notes, last 24 h vitals and pain scores, last 48 h intake and output, last 24 h labs and trends, and last 24 h imaging results.    LOS: 4 days    Letha Cape, [facility] 12/27/2022, 9:08 AM Please see Amion for pager number during day hours 7:00am-4:30pm

## 2022-12-27 NOTE — Progress Notes (Signed)
Referring Physician(s): Carlena Bjornstad, PA-C  Supervising Physician: Simonne Come  Patient Status:  [facility] - In-pt  Chief Complaint:  Diverticular abscess s/p drain placement 9/6  Subjective:  Patient sitting upright in bed at time of exam. He states he is having some minor generalized abdominal pain.   Allergies: Patient has no known allergies.  Medications: Prior to Admission medications   Medication Sig Start Date End Date Taking? Authorizing Provider  aspirin EC 81 MG tablet Take 81 mg by mouth at bedtime. Swallow whole.   Yes [provider]  clopidogrel (PLAVIX) 75 MG tablet TAKE ONE TABLET BY MOUTH DAILY AT 6 AM 05/10/21  Yes Tobb, Kardie, DO     Vital Signs: BP 129/75 (BP Location: Right Arm)   Pulse (!) 58   Temp 97.9 F (36.6 C) (Oral)   Resp 16   Ht 6\' 2"  (1.88 m)   Wt 240 lb (108.9 kg)   SpO2 93%   BMI 30.81 kg/m   Physical Exam Vitals reviewed.  Pulmonary:     Effort: Pulmonary effort is normal.  Abdominal:     Comments: LLQ abdominal drain in place. Dressed appropriately with dressing c/d/I. Suture and stat lock intact. Drain insertion site without erythema, bleeding, or drainage noted. Small amount of serosanguineous fluid in drain bulb at time of exam. Drain flushes/aspirates easily   Skin:    General: Skin is warm and dry.  Neurological:     Mental Status: He is alert and oriented to person, place, and time.  Psychiatric:        Mood and Affect: Mood normal.        Behavior: Behavior normal.        Thought Content: Thought content normal.        Judgment: Judgment normal.     Imaging: CT GUIDED PERITONEAL/RETROPERITONEAL FLUID DRAIN BY PERC CATH  Result Date: 12/26/2022 CLINICAL DATA:  Diverticular abscess EXAM: CT GUIDED DRAINAGE OF PELVIC ABSCESS ANESTHESIA/SEDATION: Intravenous Fentanyl and Versed 4mg  were administered as conscious sedation during continuous monitoring of the patient's level of consciousness and  physiological / cardiorespiratory status by the radiology RN, with a total moderate sedation time of 22 minutes. PROCEDURE: The procedure, risks, benefits, and alternatives were explained to the patient. Questions regarding the procedure were encouraged and answered. The patient understands and consents to the procedure. Select axial scans through the pelvis were obtained. The complex low anterior pelvic collection was localized and an appropriate skin entry site was determined and marked. The operative field was prepped with chlorhexidinein a sterile fashion, and a sterile drape was applied covering the operative field. A sterile gown and sterile gloves were used for the procedure. Local anesthesia was provided with 1% Lidocaine. Under CT fluoroscopic guidance, 18 gauge trocar needle advanced to the collection. Purulent material could be aspirated. Guidewire advanced easily within the collection, confirmed on CT. Tract dilated to facilitate placement 12 French pigtail drain catheter, formed centrally within the collection. 25 mL purulent aspirate sent for Gram stain and culture. Catheter secured externally with 0 Prolene suture and StatLock and placed to bulb drainage. The patient tolerated the procedure well. RADIATION DOSE REDUCTION: This exam was performed according to the departmental dose-optimization program which includes automated exposure control, adjustment of the mA and/or kV according to patient size and/or use of iterative reconstruction technique. COMPLICATIONS: None immediate FINDINGS: Complex anterior pelvic process was localized. 12 French pigtail drain catheter placed as above. 25 mL purulent aspirate sent for Gram  stain and culture. IMPRESSION: 1. Technically successful CT-guided pelvic abscess drain catheter placement Electronically Signed   By: Corlis Leak M.D.   On: 12/26/2022 17:16   CT ABDOMEN PELVIS W CONTRAST  Result Date: 12/25/2022 CLINICAL DATA:  Diverticulitis, complication suspected  EXAM: CT ABDOMEN AND PELVIS WITH CONTRAST TECHNIQUE: Multidetector CT imaging of the abdomen and pelvis was performed using the standard protocol following bolus administration of intravenous contrast. RADIATION DOSE REDUCTION: This exam was performed according to the departmental dose-optimization program which includes automated exposure control, adjustment of the mA and/or kV according to patient size and/or use of iterative reconstruction technique. CONTRAST:  75mL OMNIPAQUE IOHEXOL 350 MG/ML SOLN COMPARISON:  12/23/2019 FINDINGS: Lower chest: Mosaic attenuation within the bilateral lung bases, similar to prior. Stable benign 5 mm subpleural pulmonary nodule in the left lower lobe (series 7, image 1), which does not require follow-up imaging. No new abnormalities. Heart size within normal limits. Hepatobiliary: Hepatomegaly and hepatic steatosis. No focal liver lesion. Unremarkable gallbladder. No hyperdense gallstone. No biliary dilatation. Pancreas: Unremarkable. No pancreatic ductal dilatation or surrounding inflammatory changes. Spleen: Normal in size without focal abnormality. Adrenals/Urinary Tract: Unremarkable adrenal glands. Kidneys enhance symmetrically without focal lesion, stone, or hydronephrosis. Ureters are nondilated. Prominent wall thickening at the bladder dome adjacent to the sigmoid colon inflammation. No air within the bladder to suggest a colovesical fistula. Stomach/Bowel: Redemonstration of a focally inflamed segment of mid sigmoid colon which contains numerous diverticula. Adjacent fluid and air containing collection measures approximately 3.7 x 2.8 x 3.6 cm, similar in size to the recent prior study. No new collections. No extraluminal air. No bowel obstruction. Normal appendix. Stomach is unremarkable. Vascular/Lymphatic: Aortic atherosclerosis. No enlarged abdominal or pelvic lymph nodes. Reproductive: Prostate is unremarkable. Other: No ascites or pneumoperitoneum.  No abdominal wall  hernia. Musculoskeletal: No new or acute bony abnormality. Degenerative disc disease of L5-S1. IMPRESSION: 1. Little interval change in the appearance of acute sigmoid diverticulitis with pericolonic abscess measuring approximately 3.7 x 2.8 x 3.6 cm. No extraluminal air. No new collections. 2. Reactive wall thickening at the bladder dome adjacent to the sigmoid colon inflammation. No air within the bladder to suggest a colovesical fistula. 3. Hepatomegaly and hepatic steatosis. 4. Mosaic attenuation within the bilateral lung bases, similar to prior. Findings are nonspecific but can be seen in the setting of small airways disease. 5. Aortic atherosclerosis (ICD10-I70.0). Electronically Signed   By: Duanne Guess D.O.   On: 12/25/2022 12:09   CT ABDOMEN PELVIS W CONTRAST  Result Date: 12/23/2022 CLINICAL DATA:  Diverticulitis with complication suspected. Left lower quadrant pain. EXAM: CT ABDOMEN AND PELVIS WITH CONTRAST TECHNIQUE: Multidetector CT imaging of the abdomen and pelvis was performed using the standard protocol following bolus administration of intravenous contrast. RADIATION DOSE REDUCTION: This exam was performed according to the departmental dose-optimization program which includes automated exposure control, adjustment of the mA and/or kV according to patient size and/or use of iterative reconstruction technique. CONTRAST:  75mL OMNIPAQUE IOHEXOL 350 MG/ML SOLN COMPARISON:  11/21/2017 FINDINGS: Lower chest: 5 mm nodule in the left lung base laterally, series 4, image 3. No change since prior study. Long-term stability is consistent with benign etiology. No imaging follow-up is indicated. Hepatobiliary: Diffuse fatty infiltration of the liver. No focal lesions. Gallbladder and bile ducts are normal. Pancreas: Unremarkable. No pancreatic ductal dilatation or surrounding inflammatory changes. Spleen: Normal in size without focal abnormality. Adrenals/Urinary Tract: Adrenal glands are unremarkable.  Kidneys are normal, without renal calculi, focal lesion,  or hydronephrosis. Bladder wall is mildly thickened diffusely. This may indicate reactive inflammation or cystitis. Correlate with urinalysis. Stomach/Bowel: Stomach, small bowel, and colon are not abnormally distended. Scattered colonic diverticula. There is mild infiltration around the mid sigmoid colon with a focal pericolonic collection measuring 3.7 cm diameter. This likely represents acute diverticulitis with abscess. Follow-up after resolution of the acute process is recommended to exclude underlying neoplasm. Appendix is normal. Vascular/Lymphatic: Aortic atherosclerosis. No enlarged abdominal or pelvic lymph nodes. Reproductive: Prostate is unremarkable. Other: No free air or free fluid. Abdominal wall musculature appears intact. Musculoskeletal: No acute or significant osseous findings. IMPRESSION: 1. Sigmoid colonic diverticulosis with 3.6 cm pericolonic collection and adjacent stranding. This likely represents acute diverticulitis with abscess. Follow-up after resolution of the acute process is recommended to exclude underlying neoplasm. 2. Diffuse fatty infiltration of the liver. 3. Mild thickening of bladder wall may indicate reactive inflammation or cystitis. Correlate with urinalysis. 4. Left solid pulmonary nodule measuring 5 mm. Per Fleischner Society Guidelines, no routine follow-up imaging is recommended. These guidelines do not apply to immunocompromised patients and patients with cancer. Follow up in patients with significant comorbidities as clinically warranted. For lung cancer screening, adhere to Lung-RADS guidelines. Reference: Radiology. 2017; 284(1):228-43. 5. Aortic atherosclerosis. Electronically Signed   By: Burman Nieves M.D.   On: 12/23/2022 19:39    Labs:  CBC: Recent Labs    12/24/22 0752 12/25/22 0811 12/26/22 0710 12/27/22 0600  WBC 12.1* 12.4* 13.1* 12.5*  HGB 14.1 13.3 14.2 14.0  HCT 41.9 40.4 43.4 42.0   PLT 359 381 445* 414*    COAGS: Recent Labs    12/24/22 0752  INR 1.0    BMP: Recent Labs    12/24/22 0752 12/25/22 0811 12/26/22 0710 12/27/22 0600  NA 137 136 137 136  K 3.9 4.3 4.3 3.8  CL 103 103 99 100  CO2 23 24 22 23   GLUCOSE 90 90 90 93  BUN 10 9 9 10   CALCIUM 8.4* 8.7* 9.0 8.8*  CREATININE 1.14 1.23 1.31* 1.26*  GFRNONAA >60 >60 >60 >60    LIVER FUNCTION TESTS: Recent Labs    12/23/22 1509  BILITOT 0.6  AST 22  ALT 27  ALKPHOS 90  PROT 7.8  ALBUMIN 3.6    Assessment and Plan:  Diverticular abscess s/p drain placement 12/27/22 -Patient tolerating drain well, no concerns with drain at this time -WBC down to 12.5 today (13.1 yesterday) -Hgb 14.0 (14.2) -No organisms seen on Gram stain, culture results pending  Drain Location: LLQ Size: Fr size: 12 Fr Date of placement: 12/26/22  Currently to: Drain collection device: suction bulb 24 hour output:  Output by Drain (mL) 12/25/22 0701 - 12/25/22 1900 12/25/22 1901 - 12/26/22 0700 12/26/22 0701 - 12/26/22 1900 12/26/22 1901 - 12/27/22 0700 12/27/22 0701 - 12/27/22 1114  Open Drain 1 Left Abdomen 12 Fr.   35 20     Interval imaging/drain manipulation:  None  Current examination: Flushes/aspirates easily.  Insertion site unremarkable. Suture and stat lock in place. Dressed appropriately.   Plan: Continue TID flushes with 5 cc NS. Record output Q shift. Dressing changes QD or PRN if soiled.  Call IR APP or on call IR MD if difficulty flushing or sudden change in drain output.  Repeat imaging/possible drain injection once output < 10 mL/QD (excluding flush material). Consideration for drain removal if output is < 10 mL/QD (excluding flush material), pending discussion with the providing surgical service.  Discharge planning: Please  contact IR APP or on call IR MD prior to patient d/c to ensure appropriate follow up plans are in place. Typically patient will follow up with IR clinic 10-14 days post  d/c for repeat imaging/possible drain injection. IR scheduler will contact patient with date/time of appointment.   Patient will need to flush drain QD with 5 cc NS, record output QD, dressing changes every 2-3 days or earlier if soiled.   IR will continue to follow - please call with questions or concerns.   Electronically Signed: Kennieth Francois, PA-C 12/27/2022, 10:54 AM   I spent a total of 15 Minutes at the the patient's bedside AND on the patient's hospital floor or unit, greater than 50% of which was counseling/coordinating care for diverticular abscess

## 2022-12-27 NOTE — TOC Progression Note (Signed)
Discharge medication (1) are being stored in the Transitions of Care [facility] on the second floor until patient is ready for discharge.

## 2022-12-27 NOTE — Plan of Care (Signed)
Patient is AOX4. Able to use call bell appropriately. Independent in room. JP drain to abdomen. Dressing CDI. Output documented. See I/O's. IV ABX as ordered.   Problem: Education: Goal: Knowledge of General Education information will improve Description: Including pain rating scale, medication(s)/side effects and non-pharmacologic comfort measures Outcome: Progressing   Problem: Health Behavior/Discharge Planning: Goal: Ability to manage health-related needs will improve Outcome: Progressing   Problem: Clinical Measurements: Goal: Ability to maintain clinical measurements within normal limits will improve Outcome: Progressing Goal: Will remain free from infection Outcome: Progressing Goal: Diagnostic test results will improve Outcome: Progressing Goal: Respiratory complications will improve Outcome: Progressing Goal: Cardiovascular complication will be avoided Outcome: Progressing   Problem: Activity: Goal: Risk for activity intolerance will decrease Outcome: Progressing   Problem: Nutrition: Goal: Adequate nutrition will be maintained Outcome: Progressing   Problem: Coping: Goal: Level of anxiety will decrease Outcome: Progressing   Problem: Elimination: Goal: Will not experience complications related to bowel motility Outcome: Progressing Goal: Will not experience complications related to urinary retention Outcome: Progressing   Problem: Pain Managment: Goal: General experience of comfort will improve Outcome: Progressing   Problem: Safety: Goal: Ability to remain free from injury will improve Outcome: Progressing   Problem: Skin Integrity: Goal: Risk for impaired skin integrity will decrease Outcome: Progressing

## 2022-12-27 NOTE — Progress Notes (Signed)
PROGRESS NOTE    Husayn Cappello  ZOX:[id number] DOB: [birth year] DOA: 12/23/2022 PCP: Don Broach, [facility]   Brief Narrative:  Billy Cummings is a 52 y.o. male with medical history significant of CAD status post PCI, hypertension, hyperlipidemia/hypertriglyceridemia, chronic leukocytosis and lymphocytosis, anxiety and depression, history of TIA, OSA on CPAP, RLS, cigarette smoking presented to the ED with a chief complaint of lower abdominal pain.  Found to have ? Abscess-- drain placed by IR--- GS following   Assessment & Plan:   Principal Problem:   Acute diverticulitis Active Problems:   Cigarette smoker   Coronary artery disease involving native coronary artery of native heart without angina pectoris   Hypertriglyceridemia   Essential hypertension   OSA (obstructive sleep apnea)   Anxiety   Depression   History of TIA (transient ischemic attack)   Restless leg syndrome   Hyperlipidemia   Incidental pulmonary nodule  Acute sigmoid diverticulitis with 3.6 cm abscess Status post intra-abdominal drain placed by IR.  General surgery following.  Pain is improving.  Leukocytosis mild improvement.  Continue IV antibiotics, general surgery planning to advance diet and observing overnight.  If improved, plan for discharge tomorrow.   Incidental pulmonary nodule CT showing left solid pulmonary nodule measuring 5 mm.  He will need outpatient follow-up imaging/close monitoring given history of longstanding heavy cigarette smoking.   CAD status post multiple PCI's, most recent in 01/2021: Not endorsing any anginal symptoms at this time.  History of TIA in 2021 Hold on aspirin and Plavix and resume at discharge.   OSA on CPAP Continue nightly CPAP.   Cigarette smoking Smokes 2 packs of cigarettes daily.  He has been counseled to quit and NicoDerm patch ordered. -PRN gum  DVT prophylaxis: SCDs Start: 12/23/22 2349   Code Status: Full Code  Family Communication:  None present at  bedside.  Plan of care discussed with patient in length and he/she verbalized understanding and agreed with it.  Status is: Inpatient Remains inpatient appropriate because: Advancing diet, plan for discharge tomorrow per surgery.   Estimated body mass index is 30.81 kg/m as calculated from the following:   Height as of this encounter: 6\' 2"  (1.88 m).   Weight as of this encounter: 108.9 kg.    Nutritional Assessment: Body mass index is 30.81 kg/m.Marland Kitchen Seen by dietician.  I agree with the assessment and plan as outlined below: Nutrition Status:        . Skin Assessment: I have examined the patient's skin and I agree with the wound assessment as performed by the wound care RN as outlined below:    Consultants:  General surgery  Procedures:  As above  Antimicrobials:  Anti-infectives (From admission, onward)    Start     Dose/Rate Route Frequency Ordered Stop   12/24/22 0400  piperacillin-tazobactam (ZOSYN) IVPB 3.375 g       Placed in "Followed by" Linked Group   3.375 g 12.5 mL/hr over 240 Minutes Intravenous Every 8 hours 12/23/22 2032     12/23/22 2045  piperacillin-tazobactam (ZOSYN) IVPB 3.375 g       Placed in "Followed by" Linked Group   3.375 g 100 mL/hr over 30 Minutes Intravenous  Once 12/23/22 2032 12/23/22 2110         Subjective: Patient seen and examined.  Mild pain at the drain site only with movement.  No other complaint.  Objective: Vitals:   12/26/22 2025 12/27/22 0025 12/27/22 0439 12/27/22 0818  BP: 114/75 111/64 108/69  129/75  Pulse: 73 66 (!) 59 (!) 58  Resp: 17 18 18 16   Temp: 98.3 F (36.8 C) 97.7 F (36.5 C) 97.8 F (36.6 C) 97.9 F (36.6 C)  TempSrc: Oral Oral Oral Oral  SpO2: 95% 96% 96% 93%  Weight:      Height:        Intake/Output Summary (Last 24 hours) at 12/27/2022 0921 Last data filed at 12/27/2022 1610 Gross per 24 hour  Intake 1200 ml  Output 55 ml  Net 1145 ml   Filed Weights   12/23/22 1430  Weight: 108.9 kg     Examination:  General exam: Appears calm and comfortable  Respiratory system: Clear to auscultation. Respiratory effort normal. Cardiovascular system: S1 & S2 heard, RRR. No JVD, murmurs, rubs, gallops or clicks. No pedal edema. Gastrointestinal system: Abdomen is nondistended, soft and nontender. No organomegaly or masses felt. Normal bowel sounds heard.  Has left lower quadrant drain and mild tenderness at drain placement. Central nervous system: Alert and oriented. No focal neurological deficits. Extremities: Symmetric 5 x 5 power. Skin: No rashes, lesions or ulcers Psychiatry: Judgement and insight appear normal. Mood & affect appropriate.    Data Reviewed: I have personally reviewed following labs and imaging studies  CBC: Recent Labs  Lab 12/23/22 1509 12/23/22 1518 12/24/22 0752 12/25/22 0811 12/26/22 0710 12/27/22 0600  WBC 15.2*  --  12.1* 12.4* 13.1* 12.5*  NEUTROABS 8.9*  --   --   --   --   --   HGB 14.7 15.0 14.1 13.3 14.2 14.0  HCT 43.2 44.0 41.9 40.4 43.4 42.0  MCV 88.2  --  87.5 86.3 87.9 87.7  PLT 395  --  359 381 445* 414*   Basic Metabolic Panel: Recent Labs  Lab 12/23/22 1509 12/23/22 1518 12/24/22 0752 12/25/22 0811 12/26/22 0710 12/27/22 0600  NA 135 135 137 136 137 136  K 4.3 4.5 3.9 4.3 4.3 3.8  CL 98 101 103 103 99 100  CO2 25  --  23 24 22 23   GLUCOSE 92 98 90 90 90 93  BUN 10 11 10 9 9 10   CREATININE 1.24 1.30* 1.14 1.23 1.31* 1.26*  CALCIUM 9.2  --  8.4* 8.7* 9.0 8.8*   GFR: Estimated Creatinine Clearance: 90.1 mL/min (A) (by C-G formula based on SCr of 1.26 mg/dL (H)). Liver Function Tests: Recent Labs  Lab 12/23/22 1509  AST 22  ALT 27  ALKPHOS 90  BILITOT 0.6  PROT 7.8  ALBUMIN 3.6   Recent Labs  Lab 12/23/22 1509  LIPASE 25   No results for input(s): "AMMONIA" in the last 168 hours. Coagulation Profile: Recent Labs  Lab 12/24/22 0752  INR 1.0   Cardiac Enzymes: No results for input(s): "CKTOTAL", "CKMB",  "CKMBINDEX", "TROPONINI" in the last 168 hours. BNP (last 3 results) No results for input(s): "PROBNP" in the last 8760 hours. HbA1C: No results for input(s): "HGBA1C" in the last 72 hours. CBG: No results for input(s): "GLUCAP" in the last 168 hours. Lipid Profile: No results for input(s): "CHOL", "HDL", "LDLCALC", "TRIG", "CHOLHDL", "LDLDIRECT" in the last 72 hours. Thyroid Function Tests: No results for input(s): "TSH", "T4TOTAL", "FREET4", "T3FREE", "THYROIDAB" in the last 72 hours. Anemia Panel: No results for input(s): "VITAMINB12", "FOLATE", "FERRITIN", "TIBC", "IRON", "RETICCTPCT" in the last 72 hours. Sepsis Labs: No results for input(s): "PROCALCITON", "LATICACIDVEN" in the last 168 hours.  Recent Results (from the past 240 hour(s))  Aerobic/Anaerobic Culture w Gram Stain (surgical/deep wound)  Status: None (Preliminary result)   Collection Time: 12/26/22  9:39 AM   Specimen: Abscess  Result Value Ref Range Status   Specimen Description ABSCESS  Final   Special Requests DRAIN  Final   Gram Stain   Final    ABUNDANT WBC PRESENT, PREDOMINANTLY PMN NO ORGANISMS SEEN Performed at [facility], [address], [place], [place] [address]    Culture PENDING  Incomplete   Report Status PENDING  Incomplete     Radiology Studies: CT GUIDED PERITONEAL/RETROPERITONEAL FLUID DRAIN BY PERC CATH  Result Date: 12/26/2022 CLINICAL DATA:  Diverticular abscess EXAM: CT GUIDED DRAINAGE OF PELVIC ABSCESS ANESTHESIA/SEDATION: Intravenous Fentanyl and Versed 4mg  were administered as conscious sedation during continuous monitoring of the patient's level of consciousness and physiological / cardiorespiratory status by the radiology RN, with a total moderate sedation time of 22 minutes. PROCEDURE: The procedure, risks, benefits, and alternatives were explained to the patient. Questions regarding the procedure were encouraged and answered. The patient understands and consents to the  procedure. Select axial scans through the pelvis were obtained. The complex low anterior pelvic collection was localized and an appropriate skin entry site was determined and marked. The operative field was prepped with chlorhexidinein a sterile fashion, and a sterile drape was applied covering the operative field. A sterile gown and sterile gloves were used for the procedure. Local anesthesia was provided with 1% Lidocaine. Under CT fluoroscopic guidance, 18 gauge trocar needle advanced to the collection. Purulent material could be aspirated. Guidewire advanced easily within the collection, confirmed on CT. Tract dilated to facilitate placement 12 French pigtail drain catheter, formed centrally within the collection. 25 mL purulent aspirate sent for Gram stain and culture. Catheter secured externally with 0 Prolene suture and StatLock and placed to bulb drainage. The patient tolerated the procedure well. RADIATION DOSE REDUCTION: This exam was performed according to the departmental dose-optimization program which includes automated exposure control, adjustment of the mA and/or kV according to patient size and/or use of iterative reconstruction technique. COMPLICATIONS: None immediate FINDINGS: Complex anterior pelvic process was localized. 12 French pigtail drain catheter placed as above. 25 mL purulent aspirate sent for Gram stain and culture. IMPRESSION: 1. Technically successful CT-guided pelvic abscess drain catheter placement Electronically Signed   By: Corlis Leak M.D.   On: 12/26/2022 17:16   CT ABDOMEN PELVIS W CONTRAST  Result Date: 12/25/2022 CLINICAL DATA:  Diverticulitis, complication suspected EXAM: CT ABDOMEN AND PELVIS WITH CONTRAST TECHNIQUE: Multidetector CT imaging of the abdomen and pelvis was performed using the standard protocol following bolus administration of intravenous contrast. RADIATION DOSE REDUCTION: This exam was performed according to the departmental dose-optimization program  which includes automated exposure control, adjustment of the mA and/or kV according to patient size and/or use of iterative reconstruction technique. CONTRAST:  75mL OMNIPAQUE IOHEXOL 350 MG/ML SOLN COMPARISON:  12/23/2019 FINDINGS: Lower chest: Mosaic attenuation within the bilateral lung bases, similar to prior. Stable benign 5 mm subpleural pulmonary nodule in the left lower lobe (series 7, image 1), which does not require follow-up imaging. No new abnormalities. Heart size within normal limits. Hepatobiliary: Hepatomegaly and hepatic steatosis. No focal liver lesion. Unremarkable gallbladder. No hyperdense gallstone. No biliary dilatation. Pancreas: Unremarkable. No pancreatic ductal dilatation or surrounding inflammatory changes. Spleen: Normal in size without focal abnormality. Adrenals/Urinary Tract: Unremarkable adrenal glands. Kidneys enhance symmetrically without focal lesion, stone, or hydronephrosis. Ureters are nondilated. Prominent wall thickening at the bladder dome adjacent to the sigmoid colon inflammation. No air  within the bladder to suggest a colovesical fistula. Stomach/Bowel: Redemonstration of a focally inflamed segment of mid sigmoid colon which contains numerous diverticula. Adjacent fluid and air containing collection measures approximately 3.7 x 2.8 x 3.6 cm, similar in size to the recent prior study. No new collections. No extraluminal air. No bowel obstruction. Normal appendix. Stomach is unremarkable. Vascular/Lymphatic: Aortic atherosclerosis. No enlarged abdominal or pelvic lymph nodes. Reproductive: Prostate is unremarkable. Other: No ascites or pneumoperitoneum.  No abdominal wall hernia. Musculoskeletal: No new or acute bony abnormality. Degenerative disc disease of L5-S1. IMPRESSION: 1. Little interval change in the appearance of acute sigmoid diverticulitis with pericolonic abscess measuring approximately 3.7 x 2.8 x 3.6 cm. No extraluminal air. No new collections. 2. Reactive  wall thickening at the bladder dome adjacent to the sigmoid colon inflammation. No air within the bladder to suggest a colovesical fistula. 3. Hepatomegaly and hepatic steatosis. 4. Mosaic attenuation within the bilateral lung bases, similar to prior. Findings are nonspecific but can be seen in the setting of small airways disease. 5. Aortic atherosclerosis (ICD10-I70.0). Electronically Signed   By: Duanne Guess D.O.   On: 12/25/2022 12:09    Scheduled Meds:  nicotine  21 mg Transdermal Daily   sodium chloride flush  5 mL Intracatheter Q8H   Continuous Infusions:  piperacillin-tazobactam (ZOSYN)  IV 3.375 g (12/27/22 0004)     LOS: 4 days   Hughie Closs, MD [facility]  12/27/2022, 9:21 AM   *Please note that this is a verbal dictation therefore any spelling or grammatical errors are due to the "Dragon [employer] One" system interpretation.  Please page via Amion and do not message via secure chat for urgent patient care matters. Secure chat can be used for non urgent patient care matters.  How to contact the [facility] Attending or Consulting provider 7A - 7P or covering provider during after hours 7P -7A, for this patient?  Check the care team in [facility] and look for a) attending/consulting [facility] provider listed and b) the [facility] team listed. Page or secure chat 7A-7P. Log into [email or web address] and use [facility] universal password to access. If you do not have the password, please contact the hospital operator. Locate the [facility] provider you are looking for under [facility] and page to a number that you can be directly reached. If you still have difficulty reaching the provider, please page the [facility] (Director on Call) for the Hospitalists listed on amion for assistance.

## 2022-12-28 DIAGNOSIS — K5792 Diverticulitis of intestine, part unspecified, without perforation or abscess without bleeding: Secondary | ICD-10-CM | POA: Diagnosis not present

## 2022-12-28 LAB — CBC
HCT: 41.1 % (ref 39.0–52.0)
Hemoglobin: 13.6 g/dL (ref 13.0–17.0)
MCH: 28.4 pg (ref 26.0–34.0)
MCHC: 33.1 g/dL (ref 30.0–36.0)
MCV: 85.8 fL (ref 80.0–100.0)
Platelets: 433 10*3/uL — ABNORMAL HIGH (ref 150–400)
RBC: 4.79 MIL/uL (ref 4.22–5.81)
RDW: 12.9 % (ref 11.5–15.5)
WBC: 13.3 10*3/uL — ABNORMAL HIGH (ref 4.0–10.5)
nRBC: 0 % (ref 0.0–0.2)

## 2022-12-28 MED ORDER — OXYCODONE-ACETAMINOPHEN 5-325 MG PO TABS
1.0000 | ORAL_TABLET | ORAL | 0 refills | Status: DC | PRN
Start: 2022-12-28 — End: 2023-07-20

## 2022-12-28 MED ORDER — AMOXICILLIN-POT CLAVULANATE 875-125 MG PO TABS: 1.0000 | ORAL_TABLET | Freq: Two times a day (BID) | ORAL | 0 refills | Status: AC

## 2022-12-28 NOTE — Plan of Care (Signed)
  Problem: Education: Goal: Knowledge of General Education information will improve Description Including pain rating scale, medication(s)/side effects and non-pharmacologic comfort measures Outcome: Progressing   Problem: Health Behavior/Discharge Planning: Goal: Ability to manage health-related needs will improve Outcome: Progressing   

## 2022-12-28 NOTE — Progress Notes (Signed)
[facility] Progress Note     Subjective: Doing well.  Tolerating soft diet.  No pain except around the drain.  Objective: Vital signs in last 24 hours: Temp:  [97.6 F (36.4 C)-98.4 F (36.9 C)] 98.4 F (36.9 C) (09/08 0755) Pulse Rate:  [58-93] 58 (09/08 0755) Resp:  [16-17] 17 (09/08 0755) BP: (109-133)/(71-80) 131/76 (09/08 0755) SpO2:  [94 %-100 %] 94 % (09/08 0755) Last BM Date : 12/27/22  Intake/Output from previous day: 09/07 0701 - 09/08 0700 In: 240 [P.O.:240] Out: 35 [Drains:35] Intake/Output this shift: No intake/output data recorded.  PE: Gen: Alert, NAD Heart: regular Lungs: CTAB Abd: soft, nondistended, nontender except around drain.  Drain with minimal output, on 20cc in 24 hrs.  This is mostly just thin bloody today.  No evidence of fistula currently  Lab Results:  Recent Labs    12/27/22 0600 12/28/22 0241  WBC 12.5* 13.3*  HGB 14.0 13.6  HCT 42.0 41.1  PLT 414* 433*   BMET Recent Labs    12/26/22 0710 12/27/22 0600  NA 137 136  K 4.3 3.8  CL 99 100  CO2 22 23  GLUCOSE 90 93  BUN 9 10  CREATININE 1.31* 1.26*  CALCIUM 9.0 8.8*   PT/INR No results for input(s): "LABPROT", "INR" in the last 72 hours.  CMP     Component Value Date/Time   NA 136 12/27/2022 0600   NA 140 01/10/2022 1459   K 3.8 12/27/2022 0600   CL 100 12/27/2022 0600   CO2 23 12/27/2022 0600   GLUCOSE 93 12/27/2022 0600   BUN 10 12/27/2022 0600   BUN 12 01/10/2022 1459   CREATININE 1.26 (H) 12/27/2022 0600   CALCIUM 8.8 (L) 12/27/2022 0600   PROT 7.8 12/23/2022 1509   ALBUMIN 3.6 12/23/2022 1509   AST 22 12/23/2022 1509   ALT 27 12/23/2022 1509   ALKPHOS 90 12/23/2022 1509   BILITOT 0.6 12/23/2022 1509   GFRNONAA >60 12/27/2022 0600   Lipase     Component Value Date/Time   LIPASE 25 12/23/2022 1509       Studies/Results: CT GUIDED PERITONEAL/RETROPERITONEAL FLUID DRAIN BY PERC CATH  Result Date: 12/26/2022 CLINICAL DATA:  Diverticular  abscess EXAM: CT GUIDED DRAINAGE OF PELVIC ABSCESS ANESTHESIA/SEDATION: Intravenous Fentanyl and Versed 4mg  were administered as conscious sedation during continuous monitoring of the patient's level of consciousness and physiological / cardiorespiratory status by the radiology RN, with a total moderate sedation time of 22 minutes. PROCEDURE: The procedure, risks, benefits, and alternatives were explained to the patient. Questions regarding the procedure were encouraged and answered. The patient understands and consents to the procedure. Select axial scans through the pelvis were obtained. The complex low anterior pelvic collection was localized and an appropriate skin entry site was determined and marked. The operative field was prepped with chlorhexidinein a sterile fashion, and a sterile drape was applied covering the operative field. A sterile gown and sterile gloves were used for the procedure. Local anesthesia was provided with 1% Lidocaine. Under CT fluoroscopic guidance, 18 gauge trocar needle advanced to the collection. Purulent material could be aspirated. Guidewire advanced easily within the collection, confirmed on CT. Tract dilated to facilitate placement 12 French pigtail drain catheter, formed centrally within the collection. 25 mL purulent aspirate sent for Gram stain and culture. Catheter secured externally with 0 Prolene suture and StatLock and placed to bulb drainage. The patient tolerated the procedure well. RADIATION DOSE REDUCTION: This exam was performed according to  the departmental dose-optimization program which includes automated exposure control, adjustment of the mA and/or kV according to patient size and/or use of iterative reconstruction technique. COMPLICATIONS: None immediate FINDINGS: Complex anterior pelvic process was localized. 12 French pigtail drain catheter placed as above. 25 mL purulent aspirate sent for Gram stain and culture. IMPRESSION: 1. Technically successful  CT-guided pelvic abscess drain catheter placement Electronically Signed   By: Corlis Leak M.D.   On: 12/26/2022 17:16    Anti-infectives: Anti-infectives (From admission, onward)    Start     Dose/Rate Route Frequency Ordered Stop   12/28/22 0000  amoxicillin-clavulanate (AUGMENTIN) 875-125 MG tablet        1 tablet Oral 2 times daily 12/28/22 0926 01/04/23 2359   12/24/22 0400  piperacillin-tazobactam (ZOSYN) IVPB 3.375 g       Placed in "Followed by" Linked Group   3.375 g 12.5 mL/hr over 240 Minutes Intravenous Every 8 hours 12/23/22 2032     12/23/22 2045  piperacillin-tazobactam (ZOSYN) IVPB 3.375 g       Placed in "Followed by" Linked Group   3.375 g 100 mL/hr over 30 Minutes Intravenous  Once 12/23/22 2032 12/23/22 2110        Assessment/Plan Diverticulitis with abscess/phlegmon -s/p IR drain placement 9/6. Culture pending, but gram - rods. -Continue drain and and would treat with 7 more days of augmentin -low fiber diet -WBC stable around 13K, AF -d/w primary on ward -can go home today and follow up with IR in drain clinic and with Dr. Dwain Sarna in 3-4 weeks and ultimately with colorectal for surgical intervention    FEN - soft/IVFs VTE - plavix resumed ID - Zosyn, switch to augmentin at discharge.   AKI Tobacco abuse CAD HLD HTN H/O TIA OSA RLS  I reviewed hospitalist notes, last 24 h vitals and pain scores, last 48 h intake and output, last 24 h labs and trends, and last 24 h imaging results.    LOS: 5 days    Letha Cape, [facility] 12/28/2022, 9:42 AM Please see Amion for pager number during day hours 7:00am-4:30pm

## 2022-12-28 NOTE — Discharge Summary (Signed)
Physician Discharge Summary  Billy Cummings ZOX:[id number] DOB: 1970/07/27 DOA: 12/23/2022  PCP: Don Broach, [facility]  Admit date: 12/23/2022 Discharge date: 12/28/2022 30 Day Unplanned Readmission Risk Score    Flowsheet Row ED to Hosp-Admission (Current) from 12/23/2022 in [facility]TH  SURGICAL  30 Day Unplanned Readmission Risk Score (%) 8.04 Filed at 12/28/2022 0801       This score is the patient's risk of an unplanned readmission within 30 days of being discharged (0 -100%). The score is based on dignosis, age, lab data, medications, orders, and past utilization.   Low:  0-14.9   Medium: 15-21.9   High: 22-29.9   Extreme: 30 and above          Admitted From: Home Disposition: Home  Recommendations for Outpatient Follow-up:  Follow up with PCP in 1-2 weeks Please obtain BMP/CBC in one week Follow-up with IR within 2 weeks Follow-up with general surgery within 2 weeks Please follow up with your PCP on the following pending results: Unresulted Labs (From admission, onward)    None         Home Health: None Equipment/Devices: None  Discharge Condition: Stable CODE STATUS: Full code Diet recommendation: Cardiac  Subjective: Examined.  Wife at the bedside.  He feels well.  No abdominal pain.  Tolerating diet.  He is excited to go home today.  Brief/Interim Summary: Billy Cummings is a 52 y.o. male with medical history significant of CAD status post PCI, hypertension, hyperlipidemia/hypertriglyceridemia, chronic leukocytosis and lymphocytosis, anxiety and depression, history of TIA, OSA on CPAP, RLS, cigarette smoking presented to the ED with a chief complaint of lower abdominal pain.  Found to have acute sigmoid diverticulitis with 3.6 cm abscess.  Started on broad-spectrum antibiotics.  Seen by general surgery- drain placed by IR-culture growing gram-negative rods, identification and sensitivities still pending.  Patient is improving, no abdominal  pain, minimal abdominal tenderness, tolerating diet.  He is afebrile.  Leukocytosis stable.  Seen by general surgery and cleared for discharge.  Received 2 days of IV antibiotics here, they are recommending Augmentin for 7 days which I have prescribed him along with Percocet.  He will follow-up with IR as well as general surgery.  He is going home with the drain and IR will arrange follow-up imaging studies.   Incidental pulmonary nodule CT showing left solid pulmonary nodule measuring 5 mm.  He will need outpatient follow-up imaging/close monitoring given history of longstanding heavy cigarette smoking.   CAD status post multiple PCI's, most recent in 01/2021: Not endorsing any anginal symptoms at this time.  History of TIA in 2021 Resume aspirin and Plavix.   OSA on CPAP Continue nightly CPAP.   Cigarette smoking Smokes 2 packs of cigarettes daily.  Counseling provided for quitting.  Discharge plan was discussed with patient and/or family member and they verbalized understanding and agreed with it.  Discharge Diagnoses:  Principal Problem:   Acute diverticulitis Active Problems:   Cigarette smoker   Coronary artery disease involving native coronary artery of native heart without angina pectoris   Hypertriglyceridemia   Essential hypertension   OSA (obstructive sleep apnea)   Anxiety   Depression   History of TIA (transient ischemic attack)   Restless leg syndrome   Hyperlipidemia   Incidental pulmonary nodule    Discharge Instructions   Allergies as of 12/28/2022   No Known Allergies      Medication List     TAKE these medications  amoxicillin-clavulanate 875-125 MG tablet Commonly known as: AUGMENTIN Take 1 tablet by mouth 2 (two) times daily for 7 days.   aspirin EC 81 MG tablet Take 81 mg by mouth at bedtime. Swallow whole.   BD PosiFlush 0.9 % Soln injection Generic drug: sodium chloride flush Please flush drain once daily with 5 mL sterile saline.    clopidogrel 75 MG tablet Commonly known as: PLAVIX TAKE ONE TABLET BY MOUTH DAILY AT 6 AM   oxyCODONE-acetaminophen 5-325 MG tablet Commonly known as: Percocet Take 1 tablet by mouth every 4 (four) hours as needed for severe pain.        Follow-up Information     [facility] IR Imaging Follow up in 2 week(s).   Specialty: Radiology Why: IR schedulers will contact patient with time/date of follow-up appointment. Please call [phone] with any questions or concerns. Contact information: [address] [place] [address] [phone]        Pc, [facility] Follow up in 1 week(s).   Contact information: [address] [place] [address] [phone]                No Known Allergies  Consultations: General surgery and IR   Procedures/Studies: CT GUIDED PERITONEAL/RETROPERITONEAL FLUID DRAIN BY PERC CATH  Result Date: 12/26/2022 CLINICAL DATA:  Diverticular abscess EXAM: CT GUIDED DRAINAGE OF PELVIC ABSCESS ANESTHESIA/SEDATION: Intravenous Fentanyl and Versed 4mg  were administered as conscious sedation during continuous monitoring of the patient's level of consciousness and physiological / cardiorespiratory status by the radiology RN, with a total moderate sedation time of 22 minutes. PROCEDURE: The procedure, risks, benefits, and alternatives were explained to the patient. Questions regarding the procedure were encouraged and answered. The patient understands and consents to the procedure. Select axial scans through the pelvis were obtained. The complex low anterior pelvic collection was localized and an appropriate skin entry site was determined and marked. The operative field was prepped with chlorhexidinein a sterile fashion, and a sterile drape was applied covering the operative field. A sterile gown and sterile gloves were used for the procedure. Local anesthesia was provided with 1% Lidocaine. Under CT fluoroscopic guidance,  18 gauge trocar needle advanced to the collection. Purulent material could be aspirated. Guidewire advanced easily within the collection, confirmed on CT. Tract dilated to facilitate placement 12 French pigtail drain catheter, formed centrally within the collection. 25 mL purulent aspirate sent for Gram stain and culture. Catheter secured externally with 0 Prolene suture and StatLock and placed to bulb drainage. The patient tolerated the procedure well. RADIATION DOSE REDUCTION: This exam was performed according to the departmental dose-optimization program which includes automated exposure control, adjustment of the mA and/or kV according to patient size and/or use of iterative reconstruction technique. COMPLICATIONS: None immediate FINDINGS: Complex anterior pelvic process was localized. 12 French pigtail drain catheter placed as above. 25 mL purulent aspirate sent for Gram stain and culture. IMPRESSION: 1. Technically successful CT-guided pelvic abscess drain catheter placement Electronically Signed   By: Corlis Leak M.D.   On: 12/26/2022 17:16   CT ABDOMEN PELVIS W CONTRAST  Result Date: 12/25/2022 CLINICAL DATA:  Diverticulitis, complication suspected EXAM: CT ABDOMEN AND PELVIS WITH CONTRAST TECHNIQUE: Multidetector CT imaging of the abdomen and pelvis was performed using the standard protocol following bolus administration of intravenous contrast. RADIATION DOSE REDUCTION: This exam was performed according to the departmental dose-optimization program which includes automated exposure control, adjustment of the mA and/or kV according to patient size  and/or use of iterative reconstruction technique. CONTRAST:  75mL OMNIPAQUE IOHEXOL 350 MG/ML SOLN COMPARISON:  12/23/2019 FINDINGS: Lower chest: Mosaic attenuation within the bilateral lung bases, similar to prior. Stable benign 5 mm subpleural pulmonary nodule in the left lower lobe (series 7, image 1), which does not require follow-up imaging. No new  abnormalities. Heart size within normal limits. Hepatobiliary: Hepatomegaly and hepatic steatosis. No focal liver lesion. Unremarkable gallbladder. No hyperdense gallstone. No biliary dilatation. Pancreas: Unremarkable. No pancreatic ductal dilatation or surrounding inflammatory changes. Spleen: Normal in size without focal abnormality. Adrenals/Urinary Tract: Unremarkable adrenal glands. Kidneys enhance symmetrically without focal lesion, stone, or hydronephrosis. Ureters are nondilated. Prominent wall thickening at the bladder dome adjacent to the sigmoid colon inflammation. No air within the bladder to suggest a colovesical fistula. Stomach/Bowel: Redemonstration of a focally inflamed segment of mid sigmoid colon which contains numerous diverticula. Adjacent fluid and air containing collection measures approximately 3.7 x 2.8 x 3.6 cm, similar in size to the recent prior study. No new collections. No extraluminal air. No bowel obstruction. Normal appendix. Stomach is unremarkable. Vascular/Lymphatic: Aortic atherosclerosis. No enlarged abdominal or pelvic lymph nodes. Reproductive: Prostate is unremarkable. Other: No ascites or pneumoperitoneum.  No abdominal wall hernia. Musculoskeletal: No new or acute bony abnormality. Degenerative disc disease of L5-S1. IMPRESSION: 1. Little interval change in the appearance of acute sigmoid diverticulitis with pericolonic abscess measuring approximately 3.7 x 2.8 x 3.6 cm. No extraluminal air. No new collections. 2. Reactive wall thickening at the bladder dome adjacent to the sigmoid colon inflammation. No air within the bladder to suggest a colovesical fistula. 3. Hepatomegaly and hepatic steatosis. 4. Mosaic attenuation within the bilateral lung bases, similar to prior. Findings are nonspecific but can be seen in the setting of small airways disease. 5. Aortic atherosclerosis (ICD10-I70.0). Electronically Signed   By: Duanne Guess D.O.   On: 12/25/2022 12:09   CT  ABDOMEN PELVIS W CONTRAST  Result Date: 12/23/2022 CLINICAL DATA:  Diverticulitis with complication suspected. Left lower quadrant pain. EXAM: CT ABDOMEN AND PELVIS WITH CONTRAST TECHNIQUE: Multidetector CT imaging of the abdomen and pelvis was performed using the standard protocol following bolus administration of intravenous contrast. RADIATION DOSE REDUCTION: This exam was performed according to the departmental dose-optimization program which includes automated exposure control, adjustment of the mA and/or kV according to patient size and/or use of iterative reconstruction technique. CONTRAST:  75mL OMNIPAQUE IOHEXOL 350 MG/ML SOLN COMPARISON:  11/21/2017 FINDINGS: Lower chest: 5 mm nodule in the left lung base laterally, series 4, image 3. No change since prior study. Long-term stability is consistent with benign etiology. No imaging follow-up is indicated. Hepatobiliary: Diffuse fatty infiltration of the liver. No focal lesions. Gallbladder and bile ducts are normal. Pancreas: Unremarkable. No pancreatic ductal dilatation or surrounding inflammatory changes. Spleen: Normal in size without focal abnormality. Adrenals/Urinary Tract: Adrenal glands are unremarkable. Kidneys are normal, without renal calculi, focal lesion, or hydronephrosis. Bladder wall is mildly thickened diffusely. This may indicate reactive inflammation or cystitis. Correlate with urinalysis. Stomach/Bowel: Stomach, small bowel, and colon are not abnormally distended. Scattered colonic diverticula. There is mild infiltration around the mid sigmoid colon with a focal pericolonic collection measuring 3.7 cm diameter. This likely represents acute diverticulitis with abscess. Follow-up after resolution of the acute process is recommended to exclude underlying neoplasm. Appendix is normal. Vascular/Lymphatic: Aortic atherosclerosis. No enlarged abdominal or pelvic lymph nodes. Reproductive: Prostate is unremarkable. Other: No free air or free  fluid. Abdominal wall musculature appears intact. Musculoskeletal: No  acute or significant osseous findings. IMPRESSION: 1. Sigmoid colonic diverticulosis with 3.6 cm pericolonic collection and adjacent stranding. This likely represents acute diverticulitis with abscess. Follow-up after resolution of the acute process is recommended to exclude underlying neoplasm. 2. Diffuse fatty infiltration of the liver. 3. Mild thickening of bladder wall may indicate reactive inflammation or cystitis. Correlate with urinalysis. 4. Left solid pulmonary nodule measuring 5 mm. Per Fleischner Society Guidelines, no routine follow-up imaging is recommended. These guidelines do not apply to immunocompromised patients and patients with cancer. Follow up in patients with significant comorbidities as clinically warranted. For lung cancer screening, adhere to Lung-RADS guidelines. Reference: Radiology. 2017; 284(1):228-43. 5. Aortic atherosclerosis. Electronically Signed   By: Burman Nieves M.D.   On: 12/23/2022 19:39     Discharge Exam: Vitals:   12/28/22 0404 12/28/22 0755  BP: 116/71 131/76  Pulse: 65 (!) 58  Resp: 16 17  Temp: 97.6 F (36.4 C) 98.4 F (36.9 C)  SpO2: 97% 94%   Vitals:   12/27/22 1700 12/27/22 2017 12/28/22 0404 12/28/22 0755  BP: 133/78 125/80 116/71 131/76  Pulse: 74 93 65 (!) 58  Resp: 16 16 16 17   Temp: 98.3 F (36.8 C) 98.2 F (36.8 C) 97.6 F (36.4 C) 98.4 F (36.9 C)  TempSrc: Oral  Oral   SpO2: 95% 95% 97% 94%  Weight:      Height:        General: Pt is alert, awake, not in acute distress Cardiovascular: RRR, S1/S2 +, no rubs, no gallops Respiratory: CTA bilaterally, no wheezing, no rhonchi Abdominal: Soft, NT, ND, bowel sounds +, intra-abdominal drain placed at left lower quadrant, has very very mild tenderness. Extremities: no edema, no cyanosis    The results of significant diagnostics from this hospitalization (including imaging, microbiology, ancillary and  laboratory) are listed below for reference.     Microbiology: Recent Results (from the past 240 hour(s))  Aerobic/Anaerobic Culture w Gram Stain (surgical/deep wound)     Status: None (Preliminary result)   Collection Time: 12/26/22  9:39 AM   Specimen: Abscess  Result Value Ref Range Status   Specimen Description ABSCESS  Final   Special Requests DRAIN  Final   Gram Stain   Final    ABUNDANT WBC PRESENT, PREDOMINANTLY PMN NO ORGANISMS SEEN    Culture   Final    MODERATE GRAM NEGATIVE RODS IDENTIFICATION AND SUSCEPTIBILITIES TO FOLLOW Performed at [facility], [address] [place], [place] [address]    Report Status PENDING  Incomplete     Labs: BNP (last 3 results) No results for input(s): "BNP" in the last 8760 hours. Basic Metabolic Panel: Recent Labs  Lab 12/23/22 1509 12/23/22 1518 12/24/22 0752 12/25/22 0811 12/26/22 0710 12/27/22 0600  NA 135 135 137 136 137 136  K 4.3 4.5 3.9 4.3 4.3 3.8  CL 98 101 103 103 99 100  CO2 25  --  23 24 22 23   GLUCOSE 92 98 90 90 90 93  BUN 10 11 10 9 9 10   CREATININE 1.24 1.30* 1.14 1.23 1.31* 1.26*  CALCIUM 9.2  --  8.4* 8.7* 9.0 8.8*   Liver Function Tests: Recent Labs  Lab 12/23/22 1509  AST 22  ALT 27  ALKPHOS 90  BILITOT 0.6  PROT 7.8  ALBUMIN 3.6   Recent Labs  Lab 12/23/22 1509  LIPASE 25   No results for input(s): "AMMONIA" in the last 168 hours. CBC: Recent Labs  Lab 12/23/22 1509  12/23/22 1518 12/24/22 0752 12/25/22 0811 12/26/22 0710 12/27/22 0600 12/28/22 0241  WBC 15.2*  --  12.1* 12.4* 13.1* 12.5* 13.3*  NEUTROABS 8.9*  --   --   --   --   --   --   HGB 14.7   < > 14.1 13.3 14.2 14.0 13.6  HCT 43.2   < > 41.9 40.4 43.4 42.0 41.1  MCV 88.2  --  87.5 86.3 87.9 87.7 85.8  PLT 395  --  359 381 445* 414* 433*   < > = values in this interval not displayed.   Cardiac Enzymes: No results for input(s): "CKTOTAL", "CKMB", "CKMBINDEX", "TROPONINI" in the last 168 hours. BNP: Invalid  input(s): "POCBNP" CBG: No results for input(s): "GLUCAP" in the last 168 hours. D-Dimer No results for input(s): "DDIMER" in the last 72 hours. Hgb A1c No results for input(s): "HGBA1C" in the last 72 hours. Lipid Profile No results for input(s): "CHOL", "HDL", "LDLCALC", "TRIG", "CHOLHDL", "LDLDIRECT" in the last 72 hours. Thyroid function studies No results for input(s): "TSH", "T4TOTAL", "T3FREE", "THYROIDAB" in the last 72 hours.  Invalid input(s): "FREET3" Anemia work up No results for input(s): "VITAMINB12", "FOLATE", "FERRITIN", "TIBC", "IRON", "RETICCTPCT" in the last 72 hours. Urinalysis    Component Value Date/Time   COLORURINE YELLOW 12/23/2022 1507   APPEARANCEUR CLEAR 12/23/2022 1507   LABSPEC 1.005 12/23/2022 1507   PHURINE 6.0 12/23/2022 1507   GLUCOSEU NEGATIVE 12/23/2022 1507   HGBUR NEGATIVE 12/23/2022 1507   BILIRUBINUR NEGATIVE 12/23/2022 1507   KETONESUR NEGATIVE 12/23/2022 1507   PROTEINUR NEGATIVE 12/23/2022 1507   NITRITE NEGATIVE 12/23/2022 1507   LEUKOCYTESUR NEGATIVE 12/23/2022 1507   Sepsis Labs Recent Labs  Lab 12/25/22 0811 12/26/22 0710 12/27/22 0600 12/28/22 0241  WBC 12.4* 13.1* 12.5* 13.3*   Microbiology Recent Results (from the past 240 hour(s))  Aerobic/Anaerobic Culture w Gram Stain (surgical/deep wound)     Status: None (Preliminary result)   Collection Time: 12/26/22  9:39 AM   Specimen: Abscess  Result Value Ref Range Status   Specimen Description ABSCESS  Final   Special Requests DRAIN  Final   Gram Stain   Final    ABUNDANT WBC PRESENT, PREDOMINANTLY PMN NO ORGANISMS SEEN    Culture   Final    MODERATE GRAM NEGATIVE RODS IDENTIFICATION AND SUSCEPTIBILITIES TO FOLLOW Performed at [facility], [address] [place], [place] [address]    Report Status PENDING  Incomplete    FURTHER DISCHARGE INSTRUCTIONS:   Get Medicines reviewed and adjusted: Please take all your medications with you for your next visit  with your Primary MD   Laboratory/radiological data: Please request your Primary MD to go over all hospital tests and procedure/radiological results at the follow up, please ask your Primary MD to get all Hospital records sent to his/her office.   In some cases, they will be blood work, cultures and biopsy results pending at the time of your discharge. Please request that your primary care M.D. goes through all the records of your hospital data and follows up on these results.   Also Note the following: If you experience worsening of your admission symptoms, develop shortness of breath, life threatening emergency, suicidal or homicidal thoughts you must seek medical attention immediately by calling 911 or calling your MD immediately  if symptoms less severe.   You must read complete instructions/literature along with all the possible adverse reactions/side effects for all the Medicines you take and that have been  prescribed to you. Take any new Medicines after you have completely understood and accpet all the possible adverse reactions/side effects.    Do not drive when taking Pain medications or sleeping medications (Benzodaizepines)   Do not take more than prescribed Pain, Sleep and Anxiety Medications. It is not advisable to combine anxiety,sleep and pain medications without talking with your primary care practitioner   Special Instructions: If you have smoked or chewed Tobacco  in the last 2 yrs please stop smoking, stop any regular Alcohol  and or any Recreational drug use.   Wear Seat belts while driving.   Please note: You were cared for by a hospitalist during your hospital stay. Once you are discharged, your primary care physician will handle any further medical issues. Please note that NO REFILLS for any discharge medications will be authorized once you are discharged, as it is imperative that you return to your primary care physician (or establish a relationship with a primary care  physician if you do not have one) for your post hospital discharge needs so that they can reassess your need for medications and monitor your lab values  Time coordinating discharge: Over 30 minutes  SIGNED:   Hughie Closs, MD  [facility] 12/28/2022, 9:26 AM *Please note that this is a verbal dictation therefore any spelling or grammatical errors are due to the "[employer]agon [employer] One" system interpretation. If 7PM-7AM, please contact night-coverage [email or web address]

## 2022-12-29 ENCOUNTER — Other Ambulatory Visit: Payer: Self-pay | Admitting: General Surgery

## 2022-12-29 DIAGNOSIS — K5732 Diverticulitis of large intestine without perforation or abscess without bleeding: Secondary | ICD-10-CM

## 2022-12-31 LAB — AEROBIC/ANAEROBIC CULTURE W GRAM STAIN (SURGICAL/DEEP WOUND)

## 2023-01-01 ENCOUNTER — Encounter (HOSPITAL_COMMUNITY): Payer: Self-pay

## 2023-01-01 ENCOUNTER — Emergency Department (HOSPITAL_COMMUNITY): Payer: 59

## 2023-01-01 ENCOUNTER — Emergency Department (HOSPITAL_COMMUNITY)
Admission: EM | Admit: 2023-01-01 | Discharge: 2023-01-01 | Disposition: A | Discharge status: Home / Self Care | Payer: 59 | Attending: Emergency Medicine | Admitting: Emergency Medicine

## 2023-01-01 ENCOUNTER — Other Ambulatory Visit: Payer: Self-pay

## 2023-01-01 DIAGNOSIS — T85598A Other mechanical complication of other gastrointestinal prosthetic devices, implants and grafts, initial encounter: Secondary | ICD-10-CM | POA: Diagnosis present

## 2023-01-01 DIAGNOSIS — Y732 Prosthetic and other implants, materials and accessory gastroenterology and urology devices associated with adverse incidents: Secondary | ICD-10-CM | POA: Diagnosis not present

## 2023-01-01 DIAGNOSIS — Z7902 Long term (current) use of antithrombotics/antiplatelets: Secondary | ICD-10-CM | POA: Diagnosis not present

## 2023-01-01 DIAGNOSIS — Z7982 Long term (current) use of aspirin: Secondary | ICD-10-CM | POA: Insufficient documentation

## 2023-01-01 DIAGNOSIS — T8189XD Other complications of procedures, not elsewhere classified, subsequent encounter: Secondary | ICD-10-CM

## 2023-01-01 NOTE — ED Provider Notes (Signed)
[place] EMERGENCY DEPARTMENT AT [facility] Provider Note   CSN: [id number] Arrival date & time: 01/01/23  1028     History  Chief Complaint  Patient presents with   Post-op Problem    Billy Cummings is a 52 y.o. male.  With a history of diverticulitis with abscess status post IR drainage placement who presents the ED for drain malfunction.  Patient was seen in the ED here on September 3 and found to have diverticulitis with abscess.  For this reason IR placed a drain which has been left in place.  He is scheduled for follow-up with surgery and IR.  He returns today given concern for drain malfunction as he is unable to flush drain with normal saline as he has been previously.  When flushing with normal saline the fluid leaks around the drain.  Has been eating and drinking normally.  Normal bowel movements and urination.  No fevers chills or other complaints this time  HPI     Home Medications Prior to Admission medications   Medication Sig Start Date End Date Taking? Authorizing Provider  amoxicillin-clavulanate (AUGMENTIN) 875-125 MG tablet Take 1 tablet by mouth 2 (two) times daily for 7 days. 12/28/22 01/04/23 Yes Pahwani, Daleen Bo, MD  aspirin EC 81 MG tablet Take 81 mg by mouth at bedtime. Swallow whole.   Yes [provider]  clopidogrel (PLAVIX) 75 MG tablet TAKE ONE TABLET BY MOUTH DAILY AT 6 AM 05/10/21  Yes Tobb, Kardie, DO  oxyCODONE-acetaminophen (PERCOCET) 5-325 MG tablet Take 1 tablet by mouth every 4 (four) hours as needed for severe pain. 12/28/22 12/28/23 Yes Pahwani, Daleen Bo, MD  sodium chloride flush 0.9 % SOLN injection Please flush drain once daily with 5 mL sterile saline. Patient taking differently: Inject into the vein 2 (two) times daily. Please flush drain once daily with 5 mL sterile saline. 12/27/22  Yes Kennieth Francois, PA      Allergies    Patient has no known allergies.    Review of Systems   Review of Systems  Gastrointestinal:  Negative for  abdominal pain.  All other systems reviewed and are negative.   Physical Exam Updated Vital Signs BP 116/80   Pulse (!) 53   Temp 97.8 F (36.6 C) (Oral)   Resp 19   Ht 6\' 2"  (1.88 m)   Wt 109.8 kg   SpO2 96%   BMI 31.07 kg/m  Physical Exam Vitals and nursing note reviewed.  HENT:     Head: Normocephalic and atraumatic.  Cardiovascular:     Rate and Rhythm: Normal rate and regular rhythm.  Pulmonary:     Effort: Pulmonary effort is normal.  Abdominal:     General: There is no distension.     Comments: Anterior lower abdominal drain placed with overlying sutures No surrounding erythema or induration No abdominal tenderness rebound rigidity or guarding  Neurological:     Mental Status: He is alert.  Psychiatric:        Mood and Affect: Mood normal.     ED Results / Procedures / Treatments   Labs (all labs ordered are listed, but only abnormal results are displayed) Labs Reviewed - No data to display  EKG None  Radiology CT PELVIS WO CONTRAST  Result Date: 01/01/2023 CLINICAL DATA:  Abscess drainage dislodgement. EXAM: CT PELVIS WITHOUT CONTRAST TECHNIQUE: Multidetector CT imaging of the pelvis was performed following the standard protocol without intravenous contrast. RADIATION DOSE REDUCTION: This exam was performed according to  the departmental dose-optimization program which includes automated exposure control, adjustment of the mA and/or kV according to patient size and/or use of iterative reconstruction technique. COMPARISON:  December 26, 2022.  December 25, 2022. FINDINGS: Urinary Tract: Mild wall thickening of urinary bladder along its superior and left aspect due to adjacent diverticulitis. Bowel: There remains wall thickening of sigmoid colon consistent with sigmoid diverticulitis. Percutaneous drainage catheter is noted between urinary bladder and sigmoid colon which is unchanged in position compared with prior CT scan. No significant residual fluid collection  remains. Vascular/Lymphatic: Aortic atherosclerosis.  No adenopathy. Reproductive:  Prostate unremarkable. Other:  No hernia or ascites is noted. Musculoskeletal: No suspicious bone lesions identified. IMPRESSION: Stable position of percutaneous drainage catheter between urinary bladder and inflamed sigmoid colon. No significant residual fluid collection is noted. Electronically Signed   By: Lupita Raider M.D.   On: 01/01/2023 14:16    Procedures Procedures    Medications Ordered in ED Medications - No data to display  ED Course/ Medical Decision Making/ A&P                                 Medical Decision Making 52 year old male with history as above returns given percutaneous drain malfunction status post IR placement for diverticulitis with abscess.  No abdominal pain or other overt signs of infection.  CT pelvis without contrast shows stable position of drainage catheter.  Discussed with IR (Dr. Loreta Ave) who has evaluated patient and imaging here.  IR has removed the drain today.  No other tests or evaluation at this time.  Patient will follow-up with IR and general surgery in the office to discuss further management of repeat episodes of diverticulitis.  He is stable for discharge  Amount and/or Complexity of Data Reviewed Radiology: ordered.           Final Clinical Impression(s) / ED Diagnoses Final diagnoses:  Draining postoperative wound, subsequent encounter    Rx / DC Orders ED Discharge Orders     None         Royanne Foots, DO 01/01/23 1518

## 2023-01-01 NOTE — Discharge Instructions (Signed)
He was seen in the emerge department for an issue with your abdominal drain The drain was removed by IR today and does not need to be replaced It is important you follow-up in the office with both IR and general surgery as scheduled Return to the emerged part for abdominal pain, fevers, chills or any other concerns

## 2023-01-01 NOTE — Progress Notes (Signed)
Supervising Physician: Gilmer Mor  Patient Status:  Billy Cummings ED  Chief Complaint:  Diverticular abscess drain --- No Output  Subjective:  Pt states abd site is tender OP from drain has diminished to almost none x 2 days States it is painful to flush and flush comes out at drain site    Allergies: Patient has no known allergies.  Medications: Prior to Admission medications   Medication Sig Start Date End Date Taking? Authorizing Provider  amoxicillin-clavulanate (AUGMENTIN) 875-125 MG tablet Take 1 tablet by mouth 2 (two) times daily for 7 days. 12/28/22 01/04/23 Yes Pahwani, Daleen Bo, MD  aspirin EC 81 MG tablet Take 81 mg by mouth at bedtime. Swallow whole.   Yes [provider]  clopidogrel (PLAVIX) 75 MG tablet TAKE ONE TABLET BY MOUTH DAILY AT 6 AM 05/10/21  Yes Tobb, Kardie, DO  oxyCODONE-acetaminophen (PERCOCET) 5-325 MG tablet Take 1 tablet by mouth every 4 (four) hours as needed for severe pain. 12/28/22 12/28/23 Yes Pahwani, Daleen Bo, MD  sodium chloride flush 0.9 % SOLN injection Please flush drain once daily with 5 mL sterile saline. Patient taking differently: Inject into the vein 2 (two) times daily. Please flush drain once daily with 5 mL sterile saline. 12/27/22  Yes Kennieth Francois, PA     Vital Signs: BP 116/80   Pulse (!) 53   Temp 97.8 F (36.6 C) (Oral)   Resp 19   Ht 6\' 2"  (1.88 m)   Wt 242 lb (109.8 kg)   SpO2 96%   BMI 31.07 kg/m   Physical Exam Skin:    General: Skin is warm.     Findings: Erythema present.     Comments: Site is clean and dry Sl tender Mild erythema at site Flushing drain does cause pain Flush pours out of site after eve 3-4 cc of saline is administered  CT:IMPRESSION: Stable position of percutaneous drainage catheter between urinary bladder and inflamed sigmoid colon. No significant residual fluid collection is noted.   Removed drain without complication; dressing placed     Imaging: CT PELVIS WO CONTRAST  Result  Date: 01/01/2023 CLINICAL DATA:  Abscess drainage dislodgement. EXAM: CT PELVIS WITHOUT CONTRAST TECHNIQUE: Multidetector CT imaging of the pelvis was performed following the standard protocol without intravenous contrast. RADIATION DOSE REDUCTION: This exam was performed according to the departmental dose-optimization program which includes automated exposure control, adjustment of the mA and/or kV according to patient size and/or use of iterative reconstruction technique. COMPARISON:  December 26, 2022.  December 25, 2022. FINDINGS: Urinary Tract: Mild wall thickening of urinary bladder along its superior and left aspect due to adjacent diverticulitis. Bowel: There remains wall thickening of sigmoid colon consistent with sigmoid diverticulitis. Percutaneous drainage catheter is noted between urinary bladder and sigmoid colon which is unchanged in position compared with prior CT scan. No significant residual fluid collection remains. Vascular/Lymphatic: Aortic atherosclerosis.  No adenopathy. Reproductive:  Prostate unremarkable. Other:  No hernia or ascites is noted. Musculoskeletal: No suspicious bone lesions identified. IMPRESSION: Stable position of percutaneous drainage catheter between urinary bladder and inflamed sigmoid colon. No significant residual fluid collection is noted. Electronically Signed   By: Lupita Raider M.D.   On: 01/01/2023 14:16    Labs:  CBC: Recent Labs    12/25/22 0811 12/26/22 0710 12/27/22 0600 12/28/22 0241  WBC 12.4* 13.1* 12.5* 13.3*  HGB 13.3 14.2 14.0 13.6  HCT 40.4 43.4 42.0 41.1  PLT 381 445* 414* 433*    COAGS: Recent  Labs    12/24/22 0752  INR 1.0    BMP: Recent Labs    12/24/22 0752 12/25/22 0811 12/26/22 0710 12/27/22 0600  NA 137 136 137 136  K 3.9 4.3 4.3 3.8  CL 103 103 99 100  CO2 23 24 22 23   GLUCOSE 90 90 90 93  BUN 10 9 9 10   CALCIUM 8.4* 8.7* 9.0 8.8*  CREATININE 1.14 1.23 1.31* 1.26*  GFRNONAA >60 >60 >60 >60    LIVER  FUNCTION TESTS: Recent Labs    12/23/22 1509  BILITOT 0.6  AST 22  ALT 27  ALKPHOS 90  PROT 7.8  ALBUMIN 3.6    Assessment and Plan:  Diverticular abscess drain intact Painful to flush CT shows no residual fluid remains  Discussed with Dr Loreta Ave Pulled drain at bedside per order--- pt tolerated well Dressing placed  Electronically Signed: Robet Leu, PA-C 01/01/2023, 2:49 PM   I spent a total of 15 Minutes at the the patient's bedside AND on the patient's hospital floor or unit, greater than 50% of which was counseling/coordinating care for drain removal

## 2023-01-01 NOTE — ED Notes (Signed)
Consult sent [employer]

## 2023-01-01 NOTE — ED Triage Notes (Signed)
Pt. Stated, My intestine tube is clogged since last night. Tube there due to infection in the intestines

## 2023-01-09 ENCOUNTER — Other Ambulatory Visit: Payer: 59

## 2023-01-09 ENCOUNTER — Inpatient Hospital Stay: Admission: RE | Admit: 2023-01-09 | Payer: 59 | Source: Ambulatory Visit

## 2023-01-13 ENCOUNTER — Telehealth: Payer: Self-pay | Admitting: *Deleted

## 2023-01-13 NOTE — Telephone Encounter (Signed)
Billy Cummings You saw this patient last week. Can you please comment on clearance and plavix?  I can package and route to the requesting office.

## 2023-01-13 NOTE — Telephone Encounter (Signed)
Pre-operative Risk Assessment    Patient Name: Billy Cummings  DOB: 01/06/1971 MRN: [id number]      Request for Surgical Clearance    Procedure:   COLECTOMY DUE TO DIVERTICULITIS  Date of Surgery:  Clearance TBD                                 Surgeon:  DR. Cliffton Asters Surgeon's Group or Practice Name:  [facility] Phone number:  [phone] Fax number:  [phone]   Type of Clearance Requested:   - Medical  - Pharmacy:  Hold Clopidogrel (Plavix) NOT INDICATED HOW LONG   Type of Anesthesia:  General    Additional requests/questions:    Wilhemina Cash   01/13/2023, 9:44 AM

## 2023-01-14 NOTE — Telephone Encounter (Signed)
Name: Billy Cummings  DOB: March 23, 1971  MRN: [id number]  Primary Cardiologist: Thomasene Ripple, DO  Chart reviewed as part of pre-operative protocol coverage. Because of Billy Cummings's past medical history and time since last visit, he will require a follow-up in-office visit in order to better assess preoperative cardiovascular risk. I think previous covering APP thought recent OV was earlier this month but per chart review, last OV was 01/10/22 therefore >1 year ago and needs OV.  Pre-op covering staff: - Please schedule appointment and call patient to inform them. If patient already had an upcoming appointment within acceptable timeframe, please add "pre-op clearance" to the appointment notes so provider is aware. - Please contact requesting surgeon's office via preferred method (i.e, phone, fax) to inform them of need for appointment prior to surgery.  This message was already routed to Dr. Servando Salina regarding Plavix hold as requested below so that this information is available to the clearing provider at time of patient's appointment.   Laurann Montana, PA-C  01/14/2023, 7:23 AM

## 2023-01-14 NOTE — Telephone Encounter (Signed)
Pt is scheduled with Jari Favre, PA for preop clearance on 02/11/23.

## 2023-01-21 NOTE — Progress Notes (Unsigned)
Cardiology Office Note:  .   Date:  01/22/2023  ID:  Billy Cummings, DOB [birth year], MRN [id number] PCP: [facility]   HeartCare Providers Cardiologist:  Thomasene Ripple, DO {  History of Present Illness: .   Billy Cummings is a 52 y.o. male with a past medical history of CAD status post DES to RCA in 2012, DES to RCA x 2 in 2017 and DES to RCA in 01/2021, hypertension, hyperlipidemia, hypertriglyceridemia, tobacco use who presents for annual evaluation of CAD.  Patient history includes stenting of the RCA in 2012, 2017, and 2022.  At that time described intermittent chest discomfort that was midsternal "pressure" with an occasional burning sensation that radiates to his neck.  He had associated shortness of breath.  Was started on Imdur and Vascepa.  Underwent cardiac catheterization 01/23/2021 which revealed 90% MCA lesion status post DES otherwise nonobstructive CAD.  DAPT with aspirin and Plavix was recommended for 6 months without interruption and followed by possible long-term Plavix therapy given his history of multiple PCI's and recurrent in-stent stenosis.  Was last seen 12/24/2021 by Bernadene Person, NP.  At that time he has had a 22-month history of increased fatigue, dyspnea exertion, lower extremity edema, and orthopnea.  Also reported chest pain at rest.  Symptoms are similar to his last anginal event.  Started on Lasix and underwent repeat R/LHC on 12/26/2021 which revealed patent proximal and mid RCA stents with minimal restenosis (20%), mild nonobstructive plaque in the mid LAD (30%) he and moderate mid circumflex stenosis (50%) he with normal RFR, normal right and left heart pressures.  Ongoing medical management of CAD was recommended.  At his follow-up appointment, he was doing well from a cardiac standpoint.  No recurrent chest pain or dyspnea.  He did note ongoing nonpitting bilateral lower extremity edema which has improved significantly with the addition of Lasix.   Continues to smoke, unfortunately.  Otherwise doing well.  Today, he tells me that he had no further chest pains or shortness of breath.  Sometimes has some fatigue.  He is here for preop clearance today.  States he needs a colonoscopy and then surgery for his diverticulitis.   Dr. Servando Salina was asked about Plavix recommendations but I cannot find where she responded.  I think since he is asymptomatic today would be fine to do a 7-day Plavix hold.  Would recommend continuing aspirin throughout if possible but can hold for 5 to 7 days if bleeding risk is too high.    ROS: Pertinent ROS in HPI  Studies Reviewed: Marland Kitchen   EKG Interpretation Date/Time:  Thursday January 22 2023 09:00:29 EDT Ventricular Rate:  71 PR Interval:  176 QRS Duration:  100 QT Interval:  414 QTC Calculation: 449 R Axis:   13  Text Interpretation: Normal sinus rhythm with sinus arrhythmia Cannot rule out Inferior infarct , age undetermined When compared with ECG of 26-Dec-2021 15:36, Inverted T waves have replaced nonspecific T wave abnormality in Inferior leads Confirmed by Jari Favre [phone]) on 01/22/2023 10:54:09 AM   Cardiac catheterization 12/26/2021  Prox RCA lesion is 20% stenosed.   Mid RCA to Dist RCA lesion is 20% stenosed.   Mid Cx lesion is 50% stenosed.   Mid LAD lesion is 30% stenosed.   Patent proximal and mid RCA stents with minimal restenosis Mild non-obstructive plaque in the mid LAD Moderate mid Circumflex stenosis (RFR 0.99-suggesting stenosis is not flow limiting) Normal right and left heart pressures   Recommendations:  Continue medical management of CAD.       Physical Exam:   VS:  BP 126/82   Pulse 75   Ht 6\' 2"  (1.88 m)   Wt 228 lb 12.8 oz (103.8 kg)   SpO2 96%   BMI 29.38 kg/m    Wt Readings from Last 3 Encounters:  01/22/23 228 lb 12.8 oz (103.8 kg)  01/01/23 242 lb (109.8 kg)  12/23/22 240 lb (108.9 kg)    GEN: Well nourished, well developed in no acute distress NECK: No JVD; No  carotid bruits CARDIAC: RRR, no murmurs, rubs, gallops RESPIRATORY:  Clear to auscultation without rales, wheezing or rhonchi  ABDOMEN: Soft, non-tender, non-distended EXTREMITIES:  No edema; No deformity   ASSESSMENT AND PLAN: .   1.  Preop clearance  Mr. Billy Cummings's perioperative risk of a major cardiac event is 0.9% according to the Revised Cardiac Risk Index (RCRI).  Therefore, he is at low risk for perioperative complications.   His functional capacity is good at 6.61 METs according to the Duke Activity Status Index (DASI). Recommendations: According to ACC/AHA guidelines, no further cardiovascular testing needed.  The patient may proceed to surgery at acceptable risk.   Antiplatelet and/or Anticoagulation Recommendations: Aspirin can be held for 5-7 days prior to his surgery.  Please resume Aspirin post operatively when it is felt to be safe from a bleeding standpoint.  Clopidogrel (Plavix) can be held for 7 days prior to his surgery and resumed as soon as possible post op.   2. CAD -No recent issues with chest pain or shortness of breath -Multiple PCI's in the past on chronic dual antiplatelet -Continue current medication regimen  4.  Hypertension -Blood pressure well-controlled today, 126/82 -Would recommend to continue current blood pressure medicines -Continue low-sodium, heart healthy diet  5.  Hyperlipidemia -Will need a lipid panel at next visit -He tells me that he just got labs done with his primary but I do not see a lipid panel  6.  Tobacco use -eventually trying to cut back but he does not think right now is the right time       Dispo: 6 months with Dr. Servando Salina  Signed, Sharlene Dory, PA-C

## 2023-01-22 ENCOUNTER — Ambulatory Visit: Payer: 59 | Attending: Physician Assistant | Admitting: Physician Assistant

## 2023-01-22 ENCOUNTER — Encounter: Payer: Self-pay | Admitting: Physician Assistant

## 2023-01-22 VITALS — BP 126/82 | HR 75 | Ht 74.0 in | Wt 228.8 lb

## 2023-01-22 DIAGNOSIS — R0609 Other forms of dyspnea: Secondary | ICD-10-CM | POA: Diagnosis not present

## 2023-01-22 DIAGNOSIS — R5382 Chronic fatigue, unspecified: Secondary | ICD-10-CM

## 2023-01-22 DIAGNOSIS — R600 Localized edema: Secondary | ICD-10-CM | POA: Diagnosis not present

## 2023-01-22 DIAGNOSIS — E785 Hyperlipidemia, unspecified: Secondary | ICD-10-CM

## 2023-01-22 DIAGNOSIS — Z720 Tobacco use: Secondary | ICD-10-CM

## 2023-01-22 DIAGNOSIS — I10 Essential (primary) hypertension: Secondary | ICD-10-CM

## 2023-01-22 DIAGNOSIS — I2510 Atherosclerotic heart disease of native coronary artery without angina pectoris: Secondary | ICD-10-CM

## 2023-01-22 NOTE — Patient Instructions (Signed)
Medication Instructions:   Your physician recommends that you continue on your current medications as directed. Please refer to the Current Medication list given to you today.   *If you need a refill on your cardiac medications before your next appointment, please call your pharmacy*   Lab Work: NONE ORDERED  TODAY    If you have labs (blood work) drawn today and your tests are completely normal, you will receive your results only by: MyChart Message (if you have MyChart) OR A paper copy in the mail If you have any lab test that is abnormal or we need to change your treatment, we will call you to review the results.   Testing/Procedures: NONE ORDERED  TODAY    Follow-Up: At [facility], you and your health needs are our priority.  As part of our continuing mission to provide you with exceptional heart care, we have created designated Provider Care Teams.  These Care Teams include your primary Cardiologist (physician) and Advanced Practice Providers (APPs -  Physician Assistants and Nurse Practitioners) who all work together to provide you with the care you need, when you need it.  We recommend signing up for the patient portal called "MyChart".  Sign up information is provided on this After Visit Summary.  MyChart is used to connect with patients for Virtual Visits (Telemedicine).  Patients are able to view lab/test results, encounter notes, upcoming appointments, etc.  Non-urgent messages can be sent to your provider as well.   To learn more about what you can do with MyChart, go to [email or web address].    Your next appointment:   6 month(s)  Provider:   Thomasene Ripple, DO     Other Instructions

## 2023-05-07 ENCOUNTER — Other Ambulatory Visit (INDEPENDENT_AMBULATORY_CARE_PROVIDER_SITE_OTHER): Payer: 59

## 2023-05-07 ENCOUNTER — Ambulatory Visit: Payer: 59 | Admitting: Gastroenterology

## 2023-05-07 ENCOUNTER — Encounter: Payer: Self-pay | Admitting: Gastroenterology

## 2023-05-07 VITALS — BP 120/80 | HR 73 | Ht 74.0 in | Wt 240.0 lb

## 2023-05-07 DIAGNOSIS — K5780 Diverticulitis of intestine, part unspecified, with perforation and abscess without bleeding: Secondary | ICD-10-CM | POA: Diagnosis not present

## 2023-05-07 DIAGNOSIS — Z860101 Personal history of adenomatous and serrated colon polyps: Secondary | ICD-10-CM

## 2023-05-07 LAB — COMPREHENSIVE METABOLIC PANEL
ALT: 30 U/L (ref 0–53)
AST: 20 U/L (ref 0–37)
Albumin: 4.5 g/dL (ref 3.5–5.2)
Alkaline Phosphatase: 108 U/L (ref 39–117)
BUN: 12 mg/dL (ref 6–23)
CO2: 31 meq/L (ref 19–32)
Calcium: 9.6 mg/dL (ref 8.4–10.5)
Chloride: 100 meq/L (ref 96–112)
Creatinine, Ser: 1.11 mg/dL (ref 0.40–1.50)
GFR: 76.16 mL/min (ref >60.00)
Glucose, Bld: 73 mg/dL (ref 70–99)
Potassium: 4 meq/L (ref 3.5–5.1)
Sodium: 137 meq/L (ref 135–145)
Total Bilirubin: 0.5 mg/dL (ref 0.2–1.2)
Total Protein: 7.6 g/dL (ref 6.0–8.3)

## 2023-05-07 LAB — CBC WITH DIFFERENTIAL/PLATELET
Basophils Absolute: 0.1 10*3/uL (ref 0.0–0.1)
Basophils Relative: 0.7 % (ref 0.0–3.0)
Eosinophils Absolute: 0.4 10*3/uL (ref 0.0–0.7)
Eosinophils Relative: 2.6 % (ref 0.0–5.0)
HCT: 51.1 % (ref 39.0–52.0)
Hemoglobin: 17.3 g/dL — ABNORMAL HIGH (ref 13.0–17.0)
Lymphocytes Relative: 38.4 % (ref 12.0–46.0)
Lymphs Abs: 5.1 10*3/uL — ABNORMAL HIGH (ref 0.7–4.0)
MCHC: 33.8 g/dL (ref 30.0–36.0)
MCV: 86.3 fL (ref 78.0–100.0)
Monocytes Absolute: 1.1 10*3/uL — ABNORMAL HIGH (ref 0.1–1.0)
Monocytes Relative: 8.1 % (ref 3.0–12.0)
Neutro Abs: 6.7 10*3/uL (ref 1.4–7.7)
Neutrophils Relative %: 50.2 % (ref 43.0–77.0)
Platelets: 318 10*3/uL (ref 150.0–400.0)
RBC: 5.92 Mil/uL — ABNORMAL HIGH (ref 4.22–5.81)
RDW: 14.1 % (ref 11.5–15.5)
WBC: 13.4 10*3/uL — ABNORMAL HIGH (ref 4.0–10.5)

## 2023-05-07 NOTE — Patient Instructions (Signed)
_______________________________________________________  If your blood pressure at your visit was 140/90 or greater, please contact your primary care physician to follow up on this.  _______________________________________________________  If you are age 53 or older, your body mass index should be between 23-30. Your Body mass index is 30.81 kg/m. If this is out of the aforementioned range listed, please consider follow up with your Primary Care Provider.  If you are age 23 or younger, your body mass index should be between 19-25. Your Body mass index is 30.81 kg/m. If this is out of the aformentioned range listed, please consider follow up with your Primary Care Provider.   ________________________________________________________  [facility] providers would like to encourage you to use [facility] to communicate with providers for non-urgent requests or questions.  Due to long hold times on the telephone, sending your provider a message by [facility] may be a faster and more efficient way to get a response.  Please allow 48 business hours for a response.  Please remember that this is for non-urgent requests.  _______________________________________________________  Your provider has requested that you go to the basement level for lab work before leaving today. Press "B" on the elevator. The lab is located at the first door on the left as you exit the elevator.  You will be contacted by our office prior to your procedure for directions on holding your Plavix.  If you do not hear from our office 2 week prior to your scheduled procedure, please call [phone] to discuss.    You have been scheduled for a CT scan of the abdomen and pelvis at [facility] ([address] [place], [place], [place] [address]).   You are scheduled on 05-18-23 at 3pm. You should by 1pm to your appointment time for registration. Please follow the written instructions below on the day of your exam:  WARNING: IF YOU ARE  ALLERGIC TO IODINE/X-RAY DYE, PLEASE NOTIFY RADIOLOGY IMMEDIATELY AT [id number]! YOU WILL BE GIVEN A 13 HOUR PREMEDICATION PREP.  1) Do not eat or drink anything after 11am (4 hours prior to your test) 2) You have been given 2 bottles of oral contrast to drink. The solution may taste better if refrigerated, but do NOT add ice or any other liquid to this solution. Shake well before drinking.    Drink 1 bottle of contrast @ 1pm (2 hours prior to your exam)  Drink 1 bottle of contrast @ 2pm (1 hour prior to your exam)  You may take any medications as prescribed with a small amount of water, if necessary. If you take any of the following medications: METFORMIN, GLUCOPHAGE, GLUCOVANCE, AVANDAMET, RIOMET, FORTAMET, ACTOPLUS MET, JANUMET, GLUMETZA or METAGLIP, you MAY be asked to HOLD this medication 48 hours AFTER the exam.  The purpose of you drinking the oral contrast is to aid in the visualization of your intestinal tract. The contrast solution may cause some diarrhea. Depending on your individual set of symptoms, you may also receive an intravenous injection of x-ray contrast/dye. Plan on being at [facility] for 30 minutes or longer, depending on the type of exam you are having performed.  This test typically takes 30-45 minutes to complete.  If you have any questions regarding your exam or if you need to reschedule, you may call the CT department at [phone] between the hours of 8:00 am and 5:00 pm, Monday-Friday.  ________________________________________________________________________   Bonita Quin have been scheduled for a colonoscopy. Please follow written instructions given to you at your visit  today.   Please pick up your prep supplies at the pharmacy within the next 1-3 days.  If you use inhalers (even only as needed), please bring them with you on the day of your procedure.  DO NOT TAKE 7 DAYS PRIOR TO TEST- Trulicity (dulaglutide) Ozempic, Wegovy (semaglutide) Mounjaro  (tirzepatide) Bydureon Bcise (exanatide extended release)  DO NOT TAKE 1 DAY PRIOR TO YOUR TEST Rybelsus (semaglutide) Adlyxin (lixisenatide) Victoza (liraglutide) Byetta (exanatide) ___________________________________________________________________________  High-Fiber Eating Plan Fiber, also called dietary fiber, is found in foods such as fruits, vegetables, whole grains, and beans. A high-fiber diet can be good for your health. Your health care provider may recommend a high-fiber diet to help: Prevent trouble pooping (constipation). Lower your cholesterol. Treat the following conditions: Hemorrhoids. This is inflammation of veins in the anus. Inflammation of specific areas of the digestive tract. Irritable bowel syndrome (IBS). This is a problem of the large intestine, also called the colon, that sometimes causes belly pain and bloating. Prevent overeating as part of a weight-loss plan. Lower the risk of heart disease, type 2 diabetes, and certain cancers. What are tips for following this plan? Reading food labels  Check the nutrition facts label on foods for the amount of dietary fiber. Choose foods that have 4 grams of fiber or more per serving. The recommended goals for how much fiber you should eat each day include: Males 23 years old or younger: 30-34 g. Males over 40 years old: 28-34 g. Females 91 years old or younger: 25-28 g. Females over 9 years old: 22-25 g. Your daily fiber goal is _____________ g. Shopping Choose whole fruits and vegetables instead of processed. For example, choose apples instead of apple juice or applesauce. Choose a variety of high-fiber foods such as avocados, lentils, oats, and pinto beans. Read the nutrition facts label on foods. Check for foods with added fiber. These foods often have high sugar and salt (sodium) amounts per serving. Cooking Use whole-grain flour for baking and cooking. [place] with brown rice instead of white rice. Make meals  that have a lot of beans and vegetables in them, such as chili or vegetable-based soups. Meal planning Start the day with a breakfast that is high in fiber, such as a cereal that has 5 g of fiber or more per serving. Eat breads and cereals that are made with whole-grain flour instead of refined flour or white flour. Eat brown rice, bulgur wheat, or millet instead of white rice. Use beans in place of meat in soups, salads, and pasta dishes. Be sure that half of the grains you eat each day are whole grains. General information You can get the recommended amount of dietary fiber by: Eating a variety of fruits, vegetables, grains, nuts, and beans. Taking a fiber supplement if you aren't able to eat enough fiber. It's better to get fiber through food than from a supplement. Slowly increase how much fiber you eat. If you increase the amount of fiber you eat too quickly, you may have bloating, cramping, or gas. Drink plenty of water to help you digest fiber. Choose high-fiber snacks, such as berries, raw vegetables, nuts, and popcorn. What foods should I eat? Fruits Berries. Pears. Apples. Oranges. Avocado. Prunes and raisins. Dried figs. Vegetables Sweet potatoes. Spinach. Kale. Artichokes. Cabbage. Broccoli. Cauliflower. Green peas. Carrots. Squash. Grains Whole-grain breads. Multigrain cereal. Oats and oatmeal. Brown rice. Barley. Bulgur wheat. Millet. Quinoa. Bran muffins. Popcorn. Rye wafer crackers. Meats and other proteins Navy beans, kidney beans, and pinto  beans. Soybeans. Split peas. Lentils. Nuts and seeds. Dairy Fiber-fortified yogurt. Fortified means that fiber has been added to the product. Beverages Fiber-fortified soy milk. Fiber-fortified orange juice. Other foods Fiber bars. The items listed above may not be all the foods and drinks you can have. Talk to a dietitian to learn more. What foods should I avoid? Fruits Fruit juice. Cooked, strained fruit. Vegetables Fried  potatoes. Canned vegetables. Well-cooked vegetables. Grains White bread. Pasta made with refined flour. White rice. Meats and other proteins Fatty meat. Fried chicken or fried fish. Dairy Milk. Cream cheese. Sour cream. Fats and oils Butters. Beverages Soft drinks. Other foods Cakes and pastries. The items listed above may not be all the foods and drinks you should avoid. Talk to a dietitian to learn more. This information is not intended to replace advice given to you by your health care provider. Make sure you discuss any questions you have with your health care provider. Document Revised: 06/30/2022 Document Reviewed: 06/30/2022 Elsevier Patient Education  2024 [employer].

## 2023-05-07 NOTE — Progress Notes (Signed)
Chief Complaint:   Referring Provider:  Pc, [facility]*      ASSESSMENT AND PLAN;   #1. Recent sigmoid diverticulitis with abscess s/p IR drainage 12/2022. Now better  #2.  H/O polyps  Plan: -High fiber diet -CBC, CMP -CT Abdo/pelvis with contrast -Colon after cardio clearence and holding plavix 5 days prior.  Can continue baby aspirin. -Stop smoking. -Thereafter, FU appt with Dr Emelia Loron.   HPI:    Billy Cummings is a 53 y.o. male  history of CAD s/p DES-RCA in 2012, DES-RCA x2 in 2017, and DES-RCA in 01/2021 on baby aspirin/Plavix, hypertension, hyperlipidemia, hypertriglyceridemia, anxiety/depression, history of TIA, OSA and tobacco use   FU from recent adm 9/3-12/28/2022 with acute sigmoid diverticulitis with 3.6 cm abscess s/p IR drainage, IV to PO antibiotics (Augmentin on discharge).  Seen by surgery, recommends colonoscopy prior to consideration of sigmoid colectomy.  Here for follow-up.  Drain has been removed.  He feels much better on high-fiber diet.  Having 2-3 BMs per day.  No melena or hematochezia.  Has been seen by cardiology and was cleared for surgery 01/2023.  Had similar problem back in 2015.  At that time he refused surgery.  Occ lower abdominal pain.  No fever or chills.  Denies having any significant upper GI symptoms except for occasional heartburn.  No weight loss or loss of appetite.   Past GI workup: Colonoscopy 01/2014: Fair prep. CF to PCF to EGD scope -2 polyps s/p polypectomy ( 1cm rectosigmoid junction polyp, 4 mm distal sigmoid polyp) -Significant diverticulosis: Moderate to severe sigmoid diverticulitis with evidence of recent diverticulitis.  The sigmoid colon was "fixed". -Bx: Tubular adenoma/hyperplastic -Recommended surgical consultation.  He was given Levaquin/metronidazole. -Repeat in 3 years.      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  Son-Marshall had a motorcycle accident 2023-doing much  better   Past Medical History:  Diagnosis Date   Anxiety    Arthritis    Cigarette smoker 08/29/2015   Coronary artery disease involving native coronary artery of native heart without angina pectoris 08/29/2015   Overview:  STEMI -late restenosis ,inferior wall--stenting of RCA x 2 08/15/15 and old PCI and stent of RCA in 2012   Depression    Diverticulitis    Dyslipidemia 08/29/2015   Essential hypertension 02/23/2020   History of myocardial infarction    History of TIA (transient ischemic attack) 2021   Hypertriglyceridemia 05/11/2017   Leukocytosis 07/17/2020   OSA (obstructive sleep apnea) 06/28/2020   Severe, AHI 40/hour-210 pounds   Restless leg syndrome     Past Surgical History:  Procedure Laterality Date   CARDIAC CATHETERIZATION     At [facility] s/p stent placment x2 followed by dr Dulce Sellar   COLONOSCOPY  01/23/2014   Colonic polyps status post polypectomy. Moderate to severe sigmoid diverticulosis with evidence of recent diverticulitis and evidence of recurrent previous diverticulitis   CORONARY ANGIOPLASTY WITH STENT PLACEMENT Left 08/15/2015   CORONARY PRESSURE/FFR STUDY N/A 12/26/2021   Procedure: INTRAVASCULAR PRESSURE WIRE/FFR STUDY;  Surgeon: Kathleene Hazel, MD;  Location: [facility] INVASIVE CV LAB;  Service: Cardiovascular;  Laterality: N/A;   CORONARY STENT INTERVENTION N/A 01/23/2021   Procedure: CORONARY STENT INTERVENTION;  Surgeon: Tonny Bollman, MD;  Location: [facility] INVASIVE CV LAB;  Service: Cardiovascular;  Laterality: N/A;   LEFT HEART CATH AND CORONARY ANGIOGRAPHY N/A 01/23/2021   Procedure: LEFT HEART CATH AND CORONARY ANGIOGRAPHY;  Surgeon: Tonny Bollman, MD;  Location: [facility] INVASIVE  CV LAB;  Service: Cardiovascular;  Laterality: N/A;   RIGHT/LEFT HEART CATH AND CORONARY ANGIOGRAPHY N/A 12/26/2021   Procedure: RIGHT/LEFT HEART CATH AND CORONARY ANGIOGRAPHY;  Surgeon: Kathleene Hazel, MD;  Location: [facility] INVASIVE CV LAB;  Service:  Cardiovascular;  Laterality: N/A;    Family History  Problem Relation Age of Onset   Heart attack Mother    Heart disease Mother    Breast cancer Mother    Heart attack Father    Heart disease Father    Heart attack Brother    Heart disease Brother    Bladder Cancer Brother    Colon cancer Neg Hx    Stomach cancer Neg Hx    Pancreatic cancer Neg Hx    Esophageal cancer Neg Hx     Social History   Tobacco Use   Smoking status: Every Day    Current packs/day: 1.50    Average packs/day: 1.5 packs/day for 38.0 years (57.0 ttl pk-yrs)    Types: Cigarettes   Smokeless tobacco: Never   Tobacco comments:    smokes 1 pack per day 06/28/2020  Vaping Use   Vaping status: Never Used  Substance Use Topics   Alcohol use: No    Alcohol/week: 0.0 standard drinks of alcohol   Drug use: No    Current Outpatient Medications  Medication Sig Dispense Refill   aspirin EC 81 MG tablet Take 81 mg by mouth at bedtime. Swallow whole.     clopidogrel (PLAVIX) 75 MG tablet TAKE ONE TABLET BY MOUTH DAILY AT 6 AM 30 tablet 0   losartan (COZAAR) 25 MG tablet Take 25 mg by mouth daily.     nitroGLYCERIN (NITROSTAT) 0.4 MG SL tablet Place 0.4 mg under the tongue every 5 (five) minutes as needed for chest pain.     oxyCODONE-acetaminophen (PERCOCET) 5-325 MG tablet Take 1 tablet by mouth every 4 (four) hours as needed for severe pain. 15 tablet 0   rosuvastatin (CRESTOR) 20 MG tablet Take 20 mg by mouth daily.     sodium chloride flush 0.9 % SOLN injection Please flush drain once daily with 5 mL sterile saline. 200 mL 2   Tadalafil 2.5 MG TABS Take 1 tablet by mouth every morning.     Vitamin D, Ergocalciferol, (DRISDOL) 1.25 MG (50000 UNIT) CAPS capsule Take 50,000 Units by mouth once a week.     Current Facility-Administered Medications  Medication Dose Route Frequency Provider Last Rate Last Admin   sodium chloride flush (NS) 0.9 % injection 3 mL  3 mL Intravenous Q12H Monge, Emily C, NP         No Known Allergies  Review of Systems:  Constitutional: Denies fever, chills, diaphoresis, appetite change and fatigue.  HEENT: Denies photophobia, eye pain, redness, hearing loss, ear pain, congestion, sore throat, rhinorrhea, sneezing, mouth sores, neck pain, neck stiffness and tinnitus.   Respiratory: Denies SOB, DOE, cough, chest tightness,  and wheezing.  Had shortness of breath in past.  He has cut down on smoking. Cardiovascular: Denies chest pain, palpitations and leg swelling.  Genitourinary: Denies dysuria, urgency, frequency, hematuria, flank pain and difficulty urinating.  Musculoskeletal: Denies myalgias, back pain, joint swelling, arthralgias and gait problem.  Skin: No rash.  Neurological: Denies dizziness, seizures, syncope, weakness, light-headedness, numbness and headaches.  Hematological: Denies adenopathy. Easy bruising, personal or family bleeding history  Psychiatric/Behavioral: Has anxiety or depression     Physical Exam:    BP 120/80 (BP Location: Left Arm, Patient Position: Sitting,  Cuff Size: Normal)   Pulse 73   Ht 6\' 2"  (1.88 m)   Wt 240 lb (108.9 kg)   SpO2 97%   BMI 30.81 kg/m  Wt Readings from Last 3 Encounters:  05/07/23 240 lb (108.9 kg)  01/22/23 228 lb 12.8 oz (103.8 kg)  01/01/23 242 lb (109.8 kg)   Constitutional:  Well-developed, in no acute distress. Psychiatric: Normal mood and affect. Behavior is normal. HEENT: Pupils normal.  Conjunctivae are normal. No scleral icterus. Cardiovascular: Normal rate, regular rhythm. No edema Pulmonary/chest: Effort normal and breath sounds-decreased no wheezing, rales or rhonchi. Abdominal: Soft, nondistended. Nontender. Bowel sounds active throughout. There are no masses palpable. No hepatomegaly. Rectal: Deferred Neurological: Alert and oriented to person place and time. Skin: Skin is warm and dry. No rashes noted.  Data Reviewed: I have personally reviewed following labs and imaging  studies  CBC:    Latest Ref Rng & Units 12/28/2022    2:41 AM 12/27/2022    6:00 AM 12/26/2022    7:10 AM  CBC  WBC 4.0 - 10.5 K/uL 13.3  12.5  13.1   Hemoglobin 13.0 - 17.0 g/dL 79.0  24.0  97.3   Hematocrit 39.0 - 52.0 % 41.1  42.0  43.4   Platelets 150 - 400 K/uL 433  414  445     CMP:    Latest Ref Rng & Units 12/27/2022    6:00 AM 12/26/2022    7:10 AM 12/25/2022    8:11 AM  CMP  Glucose 70 - 99 mg/dL 93  90  90   BUN 6 - 20 mg/dL 10  9  9    Creatinine 0.61 - 1.24 mg/dL 5.32  9.92  4.26   Sodium 135 - 145 mmol/L 136  137  136   Potassium 3.5 - 5.1 mmol/L 3.8  4.3  4.3   Chloride 98 - 111 mmol/L 100  99  103   CO2 22 - 32 mmol/L 23  22  24    Calcium 8.9 - 10.3 mg/dL 8.8  9.0  8.7       Edman Circle, MD 05/07/2023, 10:51 AM  Cc: [facility]*

## 2023-05-12 ENCOUNTER — Telehealth: Payer: Self-pay | Admitting: Gastroenterology

## 2023-05-12 NOTE — Telephone Encounter (Signed)
Approval given verbally and in written form by the PCP. Paper sent to be scanned

## 2023-05-12 NOTE — Telephone Encounter (Addendum)
Patient made aware as well and he voiced understanding

## 2023-05-12 NOTE — Telephone Encounter (Signed)
Inbound call from [place] with [facility] requesting a call back in regards to patient forms that was previously faxed over. Please advise.   Thank you

## 2023-05-18 ENCOUNTER — Ambulatory Visit (HOSPITAL_COMMUNITY)
Admission: RE | Admit: 2023-05-18 | Discharge: 2023-05-18 | Disposition: A | Discharge status: Home / Self Care | Payer: 59 | Source: Ambulatory Visit | Attending: Gastroenterology | Admitting: Gastroenterology

## 2023-05-18 DIAGNOSIS — K5780 Diverticulitis of intestine, part unspecified, with perforation and abscess without bleeding: Secondary | ICD-10-CM

## 2023-05-18 MED ORDER — IOHEXOL 300 MG/ML  SOLN
100.0000 mL | Freq: Once | INTRAMUSCULAR | Status: AC | PRN
  Administered 2023-05-18: 100 mL via INTRAVENOUS

## 2023-05-18 MED ORDER — IOHEXOL 300 MG/ML  SOLN
30.0000 mL | Freq: Once | INTRAMUSCULAR | Status: AC | PRN
  Administered 2023-05-18: 30 mL via ORAL

## 2023-07-13 ENCOUNTER — Telehealth: Payer: Self-pay | Admitting: Gastroenterology

## 2023-07-13 NOTE — Telephone Encounter (Signed)
 Inbound call from patient stating he has questions regarding prep medication. States he misplaced prep instructions. Please advise, thank you.

## 2023-07-13 NOTE — Telephone Encounter (Signed)
 Patient advised and see instructions in MyChart and is aware to call back with any questions or concerns

## 2023-07-17 ENCOUNTER — Ambulatory Visit: Payer: 59 | Attending: Cardiology | Admitting: Cardiology

## 2023-07-17 ENCOUNTER — Encounter: Payer: Self-pay | Admitting: Cardiology

## 2023-07-17 VITALS — BP 140/98 | HR 72 | Ht 74.0 in | Wt 235.2 lb

## 2023-07-17 DIAGNOSIS — Z01810 Encounter for preprocedural cardiovascular examination: Secondary | ICD-10-CM | POA: Diagnosis not present

## 2023-07-17 DIAGNOSIS — I2510 Atherosclerotic heart disease of native coronary artery without angina pectoris: Secondary | ICD-10-CM

## 2023-07-17 DIAGNOSIS — I10 Essential (primary) hypertension: Secondary | ICD-10-CM

## 2023-07-17 DIAGNOSIS — E785 Hyperlipidemia, unspecified: Secondary | ICD-10-CM

## 2023-07-17 MED ORDER — LOSARTAN POTASSIUM 50 MG PO TABS
50.0000 mg | ORAL_TABLET | Freq: Every day | ORAL | 3 refills | Status: AC
Start: 2023-07-17 — End: 2023-10-26

## 2023-07-17 NOTE — Progress Notes (Unsigned)
 Cardiology Office Note:    Date:  07/22/2023   ID:  Billy Cummings, DOB Sep 18, 1970, MRN [id number]  PCP:  Olevia Bowens, FNP  Cardiologist:  Thomasene Ripple, DO  Electrophysiologist:  None   Referring MD: Pc, [facility]*   " I am ok"   History of Present Illness:    Billy Cummings is a 53 y.o. male with a hx of CAD status post DES to RCA in 2012, DES to RCA x 2 in 2017 and DES to RCA in 01/2021, hypertension, hyperlipidemia, hypertriglyceridemia, tobacco use who presents for annual evaluation of CAD.   He has been seen in our office this is my first visit with the patient.  In October 2024 he was seenAnd at that time he appears that he was doing well from a cardiovascular standpoint in terms of symptoms.  For his PCI there were no changes in his medication.  He offers no compliants at this time.   Discussed the use of AI scribe software for clinical note transcription with the patient, who gave verbal consent to proceed.   Today he  presents for a pre-procedure check-up. He has been experiencing high blood pressure readings, which he attributes to stress and recent cigarette smoking. He denies any chest pain and has not had any in a while. He is currently on Plavix, aspirin, losartan, and Crestor for his hypertension. He is also scheduled for a colonoscopy due to his history of diverticulitis. He reports that his allergies have been causing him discomfort, for which he uses Flonase.         Past Medical History:  Diagnosis Date   Anxiety    Arthritis    Cigarette smoker 08/29/2015   Coronary artery disease involving native coronary artery of native heart without angina pectoris 08/29/2015   Overview:  STEMI -late restenosis ,inferior wall--stenting of RCA x 2 08/15/15 and old PCI and stent of RCA in 2012   Depression    Diverticulitis    Dyslipidemia 08/29/2015   Essential hypertension 02/23/2020   History of myocardial infarction    History of TIA (transient ischemic  attack) 2021   Hypertriglyceridemia 05/11/2017   Leukocytosis 07/17/2020   OSA (obstructive sleep apnea) 06/28/2020   Severe, AHI 40/hour-210 pounds   Restless leg syndrome     Past Surgical History:  Procedure Laterality Date   CARDIAC CATHETERIZATION     At [facility] s/p stent placment x2 followed by dr Dulce Sellar   COLONOSCOPY  01/23/2014   Colonic polyps status post polypectomy. Moderate to severe sigmoid diverticulosis with evidence of recent diverticulitis and evidence of recurrent previous diverticulitis   CORONARY ANGIOPLASTY WITH STENT PLACEMENT Left 08/15/2015   CORONARY PRESSURE/FFR STUDY N/A 12/26/2021   Procedure: INTRAVASCULAR PRESSURE WIRE/FFR STUDY;  Surgeon: Kathleene Hazel, MD;  Location: [facility] INVASIVE CV LAB;  Service: Cardiovascular;  Laterality: N/A;   CORONARY STENT INTERVENTION N/A 01/23/2021   Procedure: CORONARY STENT INTERVENTION;  Surgeon: Tonny Bollman, MD;  Location[facility] INVASIVE CV LAB;  Service: Cardiovascular;  Laterality: N/A;   LEFT HEART CATH AND CORONARY ANGIOGRAPHY N/A 01/23/2021   Procedure: LEFT HEART CATH AND CORONARY ANGIOGRAPHY;  Surgeon: Tonny Bollman, MD;  Location: [facility] INVASIVE CV LAB;  Service: Cardiovascular;  Laterality: N/A;   RIGHT/LEFT HEART CATH AND CORONARY ANGIOGRAPHY N/A 12/26/2021   Procedure: RIGHT/LEFT HEART CATH AND CORONARY ANGIOGRAPHY;  Surgeon: Kathleene Hazel, MD;  Location: [facility] INVASIVE CV LAB;  Service: Cardiovascular;  Laterality: N/A;    Current Medications: Current  Meds  Medication Sig   aspirin EC 81 MG tablet Take 81 mg by mouth at bedtime. Swallow whole.   clopidogrel (PLAVIX) 75 MG tablet TAKE ONE TABLET BY MOUTH DAILY AT 6 AM   losartan (COZAAR) 50 MG tablet Take 1 tablet (50 mg total) by mouth daily.   nitroGLYCERIN (NITROSTAT) 0.4 MG SL tablet Place 0.4 mg under the tongue every 5 (five) minutes as needed for chest pain.   rosuvastatin (CRESTOR) 20 MG tablet Take 20 mg by mouth daily.   sodium  chloride flush 0.9 % SOLN injection Please flush drain once daily with 5 mL sterile saline.   Tadalafil 2.5 MG TABS Take 1 tablet by mouth every morning. (Patient not taking: Reported on 07/20/2023)   Vitamin D, Ergocalciferol, (DRISDOL) 1.25 MG (50000 UNIT) CAPS capsule Take 50,000 Units by mouth once a week. (Patient not taking: Reported on 07/20/2023)   [DISCONTINUED] losartan (COZAAR) 25 MG tablet Take 25 mg by mouth daily.   [DISCONTINUED] oxyCODONE-acetaminophen (PERCOCET) 5-325 MG tablet Take 1 tablet by mouth every 4 (four) hours as needed for severe pain.   Current Facility-Administered Medications for the 07/17/23 encounter (Office Visit) with Thomasene Ripple, DO  Medication   sodium chloride flush (NS) 0.9 % injection 3 mL     Allergies:   Patient has no known allergies.   Social History   Socioeconomic History   Marital status: Divorced    Spouse name: Not on file   Number of children: Not on file   Years of education: Not on file   Highest education level: Not on file  Occupational History   Not on file  Tobacco Use   Smoking status: Every Day    Current packs/day: 1.50    Average packs/day: 1.5 packs/day for 38.0 years (57.0 ttl pk-yrs)    Types: Cigarettes   Smokeless tobacco: Never   Tobacco comments:    smokes 1 pack per day 06/28/2020  Vaping Use   Vaping status: Never Used  Substance and Sexual Activity   Alcohol use: No    Alcohol/week: 0.0 standard drinks of alcohol   Drug use: No   Sexual activity: Not on file  Other Topics Concern   Not on file  Social History Narrative   Not on file   Social Drivers of Health   Financial Resource Strain: Not on file  Food Insecurity: No Food Insecurity (12/24/2022)   Hunger Vital Sign    Worried About Running Out of Food in the Last Year: Never true    Ran Out of Food in the Last Year: Never true  Transportation Needs: No Transportation Needs (12/24/2022)   PRAPARE - Administrator, Civil Service (Medical):  No    Lack of Transportation (Non-Medical): No  Physical Activity: Not on file  Stress: Not on file  Social Connections: Not on file     Family History: The patient's family history includes Bladder Cancer in his brother; Breast cancer in his mother; Heart attack in his brother, father, and mother; Heart disease in his brother, father, and mother. There is no history of Colon cancer, Stomach cancer, Pancreatic cancer, or Esophageal cancer.  ROS:   Review of Systems  Constitution: Negative for decreased appetite, fever and weight gain.  HENT: Negative for congestion, ear discharge, hoarse voice and sore throat.   Eyes: Negative for discharge, redness, vision loss in right eye and visual halos.  Cardiovascular: Negative for chest pain, dyspnea on exertion, leg swelling, orthopnea and palpitations.  Respiratory: Negative for cough, hemoptysis, shortness of breath and snoring.   Endocrine: Negative for heat intolerance and polyphagia.  Hematologic/Lymphatic: Negative for bleeding problem. Does not bruise/bleed easily.  Skin: Negative for flushing, nail changes, rash and suspicious lesions.  Musculoskeletal: Negative for arthritis, joint pain, muscle cramps, myalgias, neck pain and stiffness.  Gastrointestinal: Negative for abdominal pain, bowel incontinence, diarrhea and excessive appetite.  Genitourinary: Negative for decreased libido, genital sores and incomplete emptying.  Neurological: Negative for brief paralysis, focal weakness, headaches and loss of balance.  Psychiatric/Behavioral: Negative for altered mental status, depression and suicidal ideas.  Allergic/Immunologic: Negative for HIV exposure and persistent infections.    EKGs/Labs/Other Studies Reviewed:    The following studies were reviewed today:   EKG:  The ekg ordered today demonstrates   Recent Labs: 05/07/2023: ALT 30; BUN 12; Creatinine, Ser 1.11; Hemoglobin 17.3; Platelets 318.0; Potassium 4.0; Sodium 137  Recent  Lipid Panel No results found for: "CHOL", "TRIG", "HDL", "CHOLHDL", "VLDL", "LDLCALC", "LDLDIRECT"  Physical Exam:    VS:  BP (!) 140/98   Pulse 72   Ht 6\' 2"  (1.88 m)   Wt 235 lb 3.2 oz (106.7 kg)   SpO2 97%   BMI 30.20 kg/m     Wt Readings from Last 3 Encounters:  07/20/23 240 lb (108.9 kg)  07/17/23 235 lb 3.2 oz (106.7 kg)  05/07/23 240 lb (108.9 kg)     GEN: Well nourished, well developed in no acute distress HEENT: Normal NECK: No JVD; No carotid bruits LYMPHATICS: No lymphadenopathy CARDIAC: S1S2 noted,RRR, no murmurs, rubs, gallops RESPIRATORY:  Clear to auscultation without rales, wheezing or rhonchi  ABDOMEN: Soft, non-tender, non-distended, +bowel sounds, no guarding. EXTREMITIES: No edema, No cyanosis, no clubbing MUSCULOSKELETAL:  No deformity  SKIN: Warm and dry NEUROLOGIC:  Alert and oriented x 3, non-focal PSYCHIATRIC:  Normal affect, good insight  ASSESSMENT:    1. Essential hypertension   2. Coronary artery disease involving native coronary artery of native heart without angina pectoris   3. Hyperlipidemia LDL goal <70   4. Preoperative cardiovascular examination    PLAN:   CAD - no angina symptoms.  Continue current medication regimen.  Hyperlipidemia-continue statin medication.     Hypertension Blood pressure slightly elevated at 140/88 mmHg. Recent smoking may contribute. Recommended increasing losartan for better control. - Increase losartan to 50 mg daily. - Monitor blood pressure at home over the weekend. - Contact clinic if blood pressure readings are consistently high.  Diverticulitis Recent exacerbation of symptoms. Colonoscopy advised for further evaluation. - Proceed with scheduled colonoscopy.  Allergic Rhinitis Symptoms managed with Flonase. Consider restarting antihistamine if symptoms persist. - Continue using Flonase as needed. - Discuss with primary care provider about restarting an antihistamine like Xyzal or  Zyrtec.  Follow-up Follow-up in six months for hypertension and other health concerns. - Schedule follow-up appointment in 6 months. - Use MyChart for communication and questions between visits.     The patient does not have any unstable cardiac conditions.  Upon evaluation today, he can achieve 4 METs or greater without anginal symptoms.  According to [facility] and AHA guidelines, he requires no further cardiac workup prior to his noncardiac surgery and should be at acceptable risk.  Our service is available as necessary in the perioperative period.          The patient is in agreement with the above plan. The patient left the office in stable condition.  The patient will follow up in  Medication Adjustments/Labs and Tests Ordered: Current medicines are reviewed at length with the patient today.  Concerns regarding medicines are outlined above.  No orders of the defined types were placed in this encounter.  Meds ordered this encounter  Medications   losartan (COZAAR) 50 MG tablet    Sig: Take 1 tablet (50 mg total) by mouth daily.    Dispense:  90 tablet    Refill:  3    Patient Instructions  Medication Instructions:  Your physician has recommended you make the following change in your medication:  INCREASE: Losartan 50 mg once daily *If you need a refill on your cardiac medications before your next appointment, please call your pharmacy*  Follow-Up: At [facility], you and your health needs are our priority.  As part of our continuing mission to provide you with exceptional heart care, our providers are all part of one team.  This team includes your primary Cardiologist (physician) and Advanced Practice Providers or APPs (Physician Assistants and Nurse Practitioners) who all work together to provide you with the care you need, when you need it.  Your next appointment:   6 month(s)  Provider:   Thomasene Ripple, DO     Other Instructions    1st Floor: - Lobby -  Registration  - Pharmacy  - Lab - Cafe  2nd Floor: - PV Lab - Diagnostic Testing (echo, CT, nuclear med)  3rd Floor: - Vacant  4th Floor: - TCTS (cardiothoracic surgery) - AFib Clinic - Structural Heart Clinic - Vascular Surgery  - Vascular Ultrasound  5th Floor: - HeartCare Cardiology (general and EP) - Clinical Pharmacy for coumadin, hypertension, lipid, weight-loss medications, and med management appointments    Valet parking services will be available as well.      Adopting a Healthy Lifestyle.  Know what a healthy weight is for you (roughly BMI <25) and aim to maintain this   Aim for 7+ servings of fruits and vegetables daily   65-80+ fluid ounces of water or unsweet tea for healthy kidneys   Limit to max 1 drink of alcohol per day; avoid smoking/tobacco   Limit animal fats in diet for cholesterol and heart health - choose grass fed whenever available   Avoid highly processed foods, and foods high in saturated/trans fats   Aim for low stress - take time to unwind and care for your mental health   Aim for 150 min of moderate intensity exercise weekly for heart health, and weights twice weekly for bone health   Aim for 7-9 hours of sleep daily   When it comes to diets, agreement about the perfect plan isnt easy to find, even among the experts. Experts at the [facility] of [employer] developed an idea known as the Healthy Eating Plate. Just imagine a plate divided into logical, healthy portions.   The emphasis is on diet quality:   Load up on vegetables and fruits - one-half of your plate: Aim for color and variety, and remember that potatoes dont count.   Go for whole grains - one-quarter of your plate: Whole wheat, barley, wheat berries, quinoa, oats, brown rice, and foods made with them. If you want pasta, go with whole wheat pasta.   Protein power - one-quarter of your plate: Fish, chicken, beans, and nuts are all healthy, versatile protein  sources. Limit red meat.   The diet, however, does go beyond the plate, offering a few other suggestions.   Use healthy plant oils, such as  olive, canola, soy, corn, sunflower and peanut. Check the labels, and avoid partially hydrogenated oil, which have unhealthy trans fats.   If youre thirsty, drink water. Coffee and tea are good in moderation, but skip sugary drinks and limit milk and dairy products to one or two daily servings.   The type of carbohydrate in the diet is more important than the amount. Some sources of carbohydrates, such as vegetables, fruits, whole grains, and beans-are healthier than others.   Finally, stay active  Signed, Thomasene Ripple, DO  07/22/2023 4:17 PM    [facility]

## 2023-07-17 NOTE — Patient Instructions (Addendum)
 Medication Instructions:  Your physician has recommended you make the following change in your medication:  INCREASE: Losartan 50 mg once daily *If you need a refill on your cardiac medications before your next appointment, please call your pharmacy*  Follow-Up: At [facility], you and your health needs are our priority.  As part of our continuing mission to provide you with exceptional heart care, our providers are all part of one team.  This team includes your primary Cardiologist (physician) and Advanced Practice Providers or APPs (Physician Assistants and Nurse Practitioners) who all work together to provide you with the care you need, when you need it.  Your next appointment:   6 month(s)  Provider:   Thomasene Ripple, DO     Other Instructions    1st Floor: - Lobby - Registration  - Pharmacy  - Lab - Cafe  2nd Floor: - PV Lab - Diagnostic Testing (echo, CT, nuclear med)  3rd Floor: - Vacant  4th Floor: - TCTS (cardiothoracic surgery) - AFib Clinic - Structural Heart Clinic - Vascular Surgery  - Vascular Ultrasound  5th Floor: - HeartCare Cardiology (general and EP) - Clinical Pharmacy for coumadin, hypertension, lipid, weight-loss medications, and med management appointments    Valet parking services will be available as well.

## 2023-07-20 ENCOUNTER — Ambulatory Visit (AMBULATORY_SURGERY_CENTER): Payer: 59 | Admitting: Gastroenterology

## 2023-07-20 ENCOUNTER — Encounter: Payer: Self-pay | Admitting: Gastroenterology

## 2023-07-20 VITALS — BP 96/65 | HR 94 | Temp 97.9°F | Resp 11 | Ht 74.0 in | Wt 240.0 lb

## 2023-07-20 DIAGNOSIS — K640 First degree hemorrhoids: Secondary | ICD-10-CM | POA: Diagnosis not present

## 2023-07-20 DIAGNOSIS — K5780 Diverticulitis of intestine, part unspecified, with perforation and abscess without bleeding: Secondary | ICD-10-CM

## 2023-07-20 DIAGNOSIS — K5730 Diverticulosis of large intestine without perforation or abscess without bleeding: Secondary | ICD-10-CM | POA: Diagnosis not present

## 2023-07-20 DIAGNOSIS — D123 Benign neoplasm of transverse colon: Secondary | ICD-10-CM

## 2023-07-20 DIAGNOSIS — D125 Benign neoplasm of sigmoid colon: Secondary | ICD-10-CM | POA: Diagnosis not present

## 2023-07-20 MED ORDER — SODIUM CHLORIDE 0.9 % IV SOLN: 500.0000 mL | Freq: Once | INTRAVENOUS | Status: DC

## 2023-07-20 NOTE — Progress Notes (Signed)
 Sedate, gd SR, tolerated procedure well, VSS, report to RN

## 2023-07-20 NOTE — Patient Instructions (Addendum)
-  Handout on polyps, diverticulosis and hemorrhoids provided -await pathology results -repeat colonoscopy for surveillance recommended. Date to be determined when pathology result become available   -Continue present medications  -Resume Plavix 07/22/2023  YOU HAD AN ENDOSCOPIC PROCEDURE TODAY AT THE [facility]:   Refer to the procedure report that was given to you for any specific questions about what was found during the examination.  If the procedure report does not answer your questions, please call your gastroenterologist to clarify.  If you requested that your care partner not be given the details of your procedure findings, then the procedure report has been included in a sealed envelope for you to review at your convenience later.  YOU SHOULD EXPECT: Some feelings of bloating in the abdomen. Passage of more gas than usual.  Walking can help get rid of the air that was put into your GI tract during the procedure and reduce the bloating. If you had a lower endoscopy (such as a colonoscopy or flexible sigmoidoscopy) you may notice spotting of blood in your stool or on the toilet paper. If you underwent a bowel prep for your procedure, you may not have a normal bowel movement for a few days.  Please Note:  You might notice some irritation and congestion in your nose or some drainage.  This is from the oxygen used during your procedure.  There is no need for concern and it should clear up in a day or so.  SYMPTOMS TO REPORT IMMEDIATELY:  Following lower endoscopy (colonoscopy or flexible sigmoidoscopy):  Excessive amounts of blood in the stool  Significant tenderness or worsening of abdominal pains  Swelling of the abdomen that is new, acute  Fever of 100F or higher  For urgent or emergent issues, a gastroenterologist can be reached at any hour by calling [phone]. Do not use MyChart messaging for urgent concerns.    DIET:  We do recommend a small meal at first, but then  you may proceed to your regular diet.  Drink plenty of fluids but you should avoid alcoholic beverages for 24 hours.  ACTIVITY:  You should plan to take it easy for the rest of today and you should NOT DRIVE or use heavy machinery until tomorrow (because of the sedation medicines used during the test).    FOLLOW UP: Our staff will call the number listed on your records the next business day following your procedure.  We will call around 7:15- 8:00 am to check on you and address any questions or concerns that you may have regarding the information given to you following your procedure. If we do not reach you, we will leave a message.     If any biopsies were taken you will be contacted by phone or by letter within the next 1-3 weeks.  Please call us at [phone] if you have not heard about the biopsies in 3 weeks.    SIGNATURES/CONFIDENTIALITY: You and/or your care partner have signed paperwork which will be entered into your electronic medical record.  These signatures attest to the fact that that the information above on your After Visit Summary has been reviewed and is understood.  Full responsibility of the confidentiality of this discharge information lies with you and/or your care-partner.

## 2023-07-20 NOTE — Progress Notes (Signed)
 Pt's states no medical or surgical changes since previsit or office visit.

## 2023-07-20 NOTE — Progress Notes (Signed)
 Chief Complaint:   Referring Provider:  P[facility], [facility]*      ASSESSMENT AND PLAN;   #1. Recent sigmoid diverticulitis with abscess s/p IR drainage 12/2022. Now better  #2.  H/O polyps  Plan: -High fiber diet -CBC, CMP -CT Abdo/pelvis with contrast -Colon after cardio clearence and holding plavix 5 days prior.  Can continue baby aspirin. -Stop smoking. -Thereafter, FU appt with Dr Emelia Loron.   HPI:    Billy Cummings is a 53 y.o. male  history of CAD s/p DES-RCA in 2012, DES-RCA x2 in 2017, and DES-RCA in 01/2021 on baby aspirin/Plavix, hypertension, hyperlipidemia, hypertriglyceridemia, anxiety/depression, history of TIA, OSA and tobacco use   FU from recent adm 9/3-12/28/2022 with acute sigmoid diverticulitis with 3.6 cm abscess s/p IR drainage, IV to PO antibiotics (Augmentin on discharge).  Seen by surgery, recommends colonoscopy prior to consideration of sigmoid colectomy.  Here for follow-up.  Drain has been removed.  He feels much better on high-fiber diet.  Having 2-3 BMs per day.  No melena or hematochezia.  Has been seen by cardiology and was cleared for surgery 01/2023.  Had similar problem back in 2015.  At that time he refused surgery.  Occ lower abdominal pain.  No fever or chills.  Denies having any significant upper GI symptoms except for occasional heartburn.  No weight loss or loss of appetite.   Past GI workup: Colonoscopy 01/2014: Fair prep. CF to PCF to EGD scope -2 polyps s/p polypectomy ( 1cm rectosigmoid junction polyp, 4 mm distal sigmoid polyp) -Significant diverticulosis: Moderate to severe sigmoid diverticulitis with evidence of recent diverticulitis.  The sigmoid colon was "fixed". -Bx: Tubular adenoma/hyperplastic -Recommended surgical consultation.  He was given Levaquin/metronidazole. -Repeat in 3 years.      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  Son-Marshall had a motorcycle accident 2023-doing much  better   Past Medical History:  Diagnosis Date   Anxiety    Arthritis    Cigarette smoker 08/29/2015   Coronary artery disease involving native coronary artery of native heart without angina pectoris 08/29/2015   Overview:  STEMI -late restenosis ,inferior wall--stenting of RCA x 2 08/15/15 and old PCI and stent of RCA in 2012   Depression    Diverticulitis    Dyslipidemia 08/29/2015   Essential hypertension 02/23/2020   History of myocardial infarction    History of TIA (transient ischemic attack) 2021   Hypertriglyceridemia 05/11/2017   Leukocytosis 07/17/2020   OSA (obstructive sleep apnea) 06/28/2020   Severe, AHI 40/hour-210 pounds   Restless leg syndrome     Past Surgical History:  Procedure Laterality Date   CARDIAC CATHETERIZATION     At [facility] s/p stent placment x2 followed by dr Dulce Sellar   COLONOSCOPY  01/23/2014   Colonic polyps status post polypectomy. Moderate to severe sigmoid diverticulosis with evidence of recent diverticulitis and evidence of recurrent previous diverticulitis   CORONARY ANGIOPLASTY WITH STENT PLACEMENT Left 08/15/2015   CORONARY PRESSURE/FFR STUDY N/A 12/26/2021   Procedure: INTRAVASCULAR PRESSURE WIRE/FFR STUDY;  Surgeon: Kathleene Hazel, MD;  Location: [facility] INVASIVE CV LAB;  Service: Cardiovascular;  Laterality: N/A;   CORONARY STENT INTERVENTION N/A 01/23/2021   Procedure: CORONARY STENT INTERVENTION;  Surgeon: Tonny Bollman, MD;  Location: [facility] INVAS[facility] CV LAB;  Service: Cardiovascular;  Laterality: N/A;   LEFT HEART CATH AND CORONARY ANGIOGRAPHY N/A 01/23/2021   Procedure: LEFT HEART CATH AND CORONARY ANGIOGRAPHY;  Surgeon: Tonny Bollman, MD;  Location: [facility] INVASIVE  CV LAB;  Service: Cardiovascular;  Laterality: N/A;   RIGHT/LEFT HEART CATH AND CORONARY ANGIOGRAPHY N/A 12/26/2021   Procedure: RIGHT/LEFT HEART CATH AND CORONARY ANGIOGRAPHY;  Surgeon: Kathleene Hazel, MD;  Location: [facility] INVASIVE CV LAB;  Service:  Cardiovascular;  Laterality: N/A;    Family History  Problem Relation Age of Onset   Heart attack Mother    Heart disease Mother    Breast cancer Mother    Heart attack Father    Heart disease Father    Heart attack Brother    Heart disease Brother    Bladder Cancer Brother    Colon cancer Neg Hx    Stomach cancer Neg Hx    Pancreatic cancer Neg Hx    Esophageal cancer Neg Hx     Social History   Tobacco Use   Smoking status: Every Day    Current packs/day: 1.50    Average packs/day: 1.5 packs/day for 38.0 years (57.0 ttl pk-yrs)    Types: Cigarettes   Smokeless tobacco: Never   Tobacco comments:    smokes 1 pack per day 06/28/2020  Vaping Use   Vaping status: Never Used  Substance Use Topics   Alcohol use: No    Alcohol/week: 0.0 standard drinks of alcohol   Drug use: No    Current Outpatient Medications  Medication Sig Dispense Refill   aspirin EC 81 MG tablet Take 81 mg by mouth at bedtime. Swallow whole.     clopidogrel (PLAVIX) 75 MG tablet TAKE ONE TABLET BY MOUTH DAILY AT 6 AM 30 tablet 0   losartan (COZAAR) 50 MG tablet Take 1 tablet (50 mg total) by mouth daily. 90 tablet 3   rosuvastatin (CRESTOR) 20 MG tablet Take 20 mg by mouth daily.     nitroGLYCERIN (NITROSTAT) 0.4 MG SL tablet Place 0.4 mg under the tongue every 5 (five) minutes as needed for chest pain.     sodium chloride flush 0.9 % SOLN injection Please flush drain once daily with 5 mL sterile saline. 200 mL 2   Tadalafil 2.5 MG TABS Take 1 tablet by mouth every morning. (Patient not taking: Reported on 07/20/2023)     Vitamin D, Ergocalciferol, (DRISDOL) 1.25 MG (50000 UNIT) CAPS capsule Take 50,000 Units by mouth once a week. (Patient not taking: Reported on 07/20/2023)     Current Facility-Administered Medications  Medication Dose Route Frequency Provider Last Rate Last Admin   0.9 %  sodium chloride infusion  500 mL Intravenous Once Lynann Bologna, MD       sodium chloride flush (NS) 0.9 %  injection 3 mL  3 mL Intravenous Q12H Monge, Emily C, NP        No Known Allergies  Review of Systems:  Constitutional: Denies fever, chills, diaphoresis, appetite change and fatigue.  HEENT: Denies photophobia, eye pain, redness, hearing loss, ear pain, congestion, sore throat, rhinorrhea, sneezing, mouth sores, neck pain, neck stiffness and tinnitus.   Respiratory: Denies SOB, DOE, cough, chest tightness,  and wheezing.  Had shortness of breath in past.  He has cut down on smoking. Cardiovascular: Denies chest pain, palpitations and leg swelling.  Genitourinary: Denies dysuria, urgency, frequency, hematuria, flank pain and difficulty urinating.  Musculoskeletal: Denies myalgias, back pain, joint swelling, arthralgias and gait problem.  Skin: No rash.  Neurological: Denies dizziness, seizures, syncope, weakness, light-headedness, numbness and headaches.  Hematological: Denies adenopathy. Easy bruising, personal or family bleeding history  Psychiatric/Behavioral: Has anxiety or depression     Physical Exam:  BP 127/78   Pulse 70   Temp 97.9 F (36.6 C)   Ht 6\' 2"  (1.88 m)   Wt 240 lb (108.9 kg)   SpO2 96%   BMI 30.81 kg/m  Wt Readings from Last 3 Encounters:  07/20/23 240 lb (108.9 kg)  07/17/23 235 lb 3.2 oz (106.7 kg)  05/07/23 240 lb (108.9 kg)   Constitutional:  Well-developed, in no acute distress. Psychiatric: Normal mood and affect. Behavior is normal. HEENT: Pupils normal.  Conjunctivae are normal. No scleral icterus. Cardiovascular: Normal rate, regular rhythm. No edema Pulmonary/chest: Effort normal and breath sounds-decreased no wheezing, rales or rhonchi. Abdominal: Soft, nondistended. Nontender. Bowel sounds active throughout. There are no masses palpable. No hepatomegaly. Rectal: Deferred Neurological: Alert and oriented to person place and time. Skin: Skin is warm and dry. No rashes noted.  Data Reviewed: I have personally reviewed following labs and  imaging studies  CBC:    Latest Ref Rng & Units 05/07/2023   11:43 AM 12/28/2022    2:41 AM 12/27/2022    6:00 AM  CBC  WBC 4.0 - 10.5 K/uL 13.4  13.3  12.5   Hemoglobin 13.0 - 17.0 g/dL 40.9  81.1  91.4   Hematocrit 39.0 - 52.0 % 51.1  41.1  42.0   Platelets 150.0 - 400.0 K/uL 318.0  433  414     CMP:    Latest Ref Rng & Units 05/07/2023   11:43 AM 12/27/2022    6:00 AM 12/26/2022    7:10 AM  CMP  Glucose 70 - 99 mg/dL 73  93  90   BUN 6 - 23 mg/dL 12  10  9    Creatinine 0.40 - 1.50 mg/dL 7.82  9.56  2.13   Sodium 135 - 145 mEq/L 137  136  137   Potassium 3.5 - 5.1 mEq/L 4.0  3.8  4.3   Chloride 96 - 112 mEq/L 100  100  99   CO2 19 - 32 mEq/L 31  23  22    Calcium 8.4 - 10.5 mg/dL 9.6  8.8  9.0   Total Protein 6.0 - 8.3 g/dL 7.6     Total Bilirubin 0.2 - 1.2 mg/dL 0.5     Alkaline Phos 39 - 117 U/L 108     AST 0 - 37 U/L 20     ALT 0 - 53 U/L 30         Edman Circle, MD 07/20/2023, 8:52 AM  Cc: [facility]*

## 2023-07-20 NOTE — Progress Notes (Signed)
 Called to room to assist during endoscopic procedure.  Patient ID and intended procedure confirmed with present staff. Received instructions for my participation in the procedure from the performing physician.

## 2023-07-20 NOTE — Op Note (Signed)
 [facility] Patient Name: Billy Cummings Procedure Date: 07/20/2023 8:56 AM MRN: [id number] Endoscopist: Lynann Bologna , MD, [id number] Age: 53 Referring MD:  Date of Birth: [birth year] Gender: Male Account #: [id number] Procedure:                Colonoscopy Indications:              #1. Recent sigmoid diverticulitis with abscess s/p                            IR drainage 12/2022. Now better                           #2. H/O polyps Medicines:                Monitored Anesthesia Care Procedure:                Pre-Anesthesia Assessment:                           - Prior to the procedure, a History and Physical                            was performed, and patient medications and                            allergies were reviewed. The patient's tolerance of                            previous anesthesia was also reviewed. The risks                            and benefits of the procedure and the sedation                            options and risks were discussed with the patient.                            All questions were answered, and informed consent                            was obtained. Prior Anticoagulants: The patient has                            taken Plavix (clopidogrel), last dose was 5 days                            prior to procedure. ASA Grade Assessment: II - A                            patient with mild systemic disease. After reviewing                            the risks and benefits, the patient was deemed in  satisfactory condition to undergo the procedure.                           After obtaining informed consent, the colonoscope                            was passed under direct vision. Throughout the                            procedure, the patient's blood pressure, pulse, and                            oxygen saturations were monitored continuously. The                            Olympus Scope [id number] was introduced  through the                            anus and advanced to the the cecum, identified by                            appendiceal orifice and ileocecal valve. The                            colonoscopy was performed without difficulty. The                            patient tolerated the procedure well. The quality                            of the bowel preparation was adequate to identify                            polyps. Retained stool and some areas of the colon                            making it somewhat harder to visualize. Aggressive                            suctioning aspiration was performed. The ileocecal                            valve, appendiceal orifice, and rectum were                            photographed. Scope In: 9:01:57 AM Scope Out: 9:20:31 AM Scope Withdrawal Time: 0 hours 13 minutes 40 seconds  Total Procedure Duration: 0 hours 18 minutes 34 seconds  Findings:                 Four sessile polyps were found in the proximal                            sigmoid colon, mid transverse colon and distal  transverse colon. The polyps were 5 to 6 mm in                            size. These polyps were removed with a cold snare.                            Resection was complete. Only 3 were retrieved as                            one was lost in the stool.                           Multiple medium-mouthed diverticula were found in                            the sigmoid colon upto 40 cm on withdrawal of the                            scope and rare small diverticula in ascending                            colon. The sigmoid colon was "fixed" with luminal                            narrowing consistent with muscular hypertrophy.                            There was mild erythema without any definite                            endoscopic evidence of diverticulitis.                           Non-bleeding internal hemorrhoids were found during                             retroflexion. The hemorrhoids were small and Grade                            I (internal hemorrhoids that do not prolapse).                           The exam was otherwise without abnormality on                            direct and retroflexion views. Complications:            No immediate complications. Estimated Blood Loss:     Estimated blood loss: none. Impression:               - Four 5 to 6 mm polyps in the proximal sigmoid                            colon, in the mid transverse colon and  in the                            distal transverse colon, removed with a cold snare.                            Resected.                           - Mod-severe sigmoid diverticulosis.                           - Non-bleeding internal hemorrhoids.                           - The examination was otherwise normal on direct                            and retroflexion views. Recommendation:           - Patient has a contact number available for                            emergencies. The signs and symptoms of potential                            delayed complications were discussed with the                            patient. Return to normal activities tomorrow.                            Written discharge instructions were provided to the                            patient.                           - Resume previous high-fiber diet.                           - Continue present medications.                           - Await pathology results.                           - Repeat colonoscopy for surveillance based on                            pathology results.                           - Resume Plavix from 4/2                           - FU appt with surgery (Dr. Emelia Loron) for  possible elective sigmoid resection.                           - The findings and recommendations were discussed                            with the patient's  family. Lynann Bologna, MD 07/20/2023 9:29:05 AM This report has been signed electronically.

## 2023-07-21 ENCOUNTER — Telehealth: Payer: Self-pay | Admitting: *Deleted

## 2023-07-21 NOTE — Telephone Encounter (Signed)
  Follow up Call-     07/20/2023    8:11 AM  Call back number  Post procedure Call Back phone  # [phone]  Permission to leave phone message Yes     Patient questions:  Do you have a fever, pain , or abdominal swelling? No. Pain Score  0 *  Have you tolerated food without any problems? Yes.    Have you been able to return to your normal activities? Yes.    Do you have any questions about your discharge instructions: Diet   No. Medications  No. Follow up visit  No.  Do you have questions or concerns about your Care? No.  Actions: * If pain score is 4 or above: No action needed, pain <4.

## 2023-07-22 LAB — SURGICAL PATHOLOGY

## 2023-07-28 ENCOUNTER — Encounter: Payer: Self-pay | Admitting: Gastroenterology

## 2023-08-14 ENCOUNTER — Telehealth: Payer: Self-pay

## 2023-08-14 NOTE — Telephone Encounter (Signed)
 Successfully all 25 pages faxed to [phone] regarding possible sigmoid resection to Dr Enid Harry

## 2023-09-10 ENCOUNTER — Telehealth: Payer: Self-pay

## 2023-09-10 NOTE — Telephone Encounter (Signed)
   Pre-operative Risk Assessment    Patient Name: Billy Cummings  DOB: 03/10/1971 MRN: [id number]   Date of last office visit: 07/17/23 Date of next office visit: n/a   Request for Surgical Clearance    Procedure:  Robotic low anterior resection with flexible sigmoidoscopy surgery   Date of Surgery:  Clearance TBD                                 Surgeon:  Beatris Lincoln, MD  Surgeon's Group or Practice Name:  [facility]  Phone number:  [phone] Fax number:  [phone]   Type of Clearance Requested:   - Medical  - Pharmacy:  Hold Aspirin  and Clopidogrel  (Plavix ) Not indicated    Type of Anesthesia:  General    Additional requests/questions:    Mansfield Seip   09/10/2023, 4:59 PM

## 2023-09-10 NOTE — Telephone Encounter (Signed)
   Name: Billy Cummings  DOB: [birth year]  MRN: [id number]  Primary Cardiologist: Kardie Tobb, DO   Preoperative team, please contact this patient and set up a phone call appointment for further preoperative risk assessment. Please obtain consent and complete medication review. Thank you for your help.  I confirm that guidance regarding antiplatelet and oral anticoagulation therapy has been completed and, if necessary, noted below.  Aspirin  can be held for 5-7 days prior to his surgery.  Please resume Aspirin  post operatively when it is felt to be safe from a bleeding standpoint.  Clopidogrel  (Plavix ) can be held for 7 days prior to his surgery and resumed as soon as possible post op.  I also confirmed the patient resides in the state of [place] . As per [facility] Medical Board telemedicine laws, the patient must reside in the state in which the provider is licensed.   Francene Ing, Retha Cast, NP 09/10/2023, 5:48 PM [facility]

## 2023-09-11 NOTE — Telephone Encounter (Signed)
 Tried calling patient to schedule televisit no answer left a detailed message to call back and schedule

## 2023-09-16 ENCOUNTER — Telehealth: Payer: Self-pay

## 2023-09-16 NOTE — Telephone Encounter (Addendum)
 Pt scheduled for virtual visit on 09/30/23.

## 2023-09-16 NOTE — Telephone Encounter (Signed)
 LVM asking pt to call office to schedule virtual visit for preop clearance. Please reach out to preop team if pt returns call. Thank you.

## 2023-09-16 NOTE — Telephone Encounter (Signed)
  Patient Consent for Virtual Visit        [name] has provided verbal consent on 09/16/2023 for a virtual visit (video or telephone).   CONSENT FOR VIRTUAL VISIT FOR:  Billy Cummings  By participating in this virtual visit I agree to the following:  I hereby voluntarily request, consent and authorize [facility] and its employed or contracted physicians, physician assistants, nurse practitioners or other licensed health care professionals (the Practitioner), to provide me with telemedicine health care services (the "Services") as deemed necessary by the treating Practitioner. I acknowledge and consent to receive the Services by the Practitioner via telemedicine. I understand that the telemedicine visit will involve communicating with the Practitioner through live audiovisual communication technology and the disclosure of certain medical information by electronic transmission. I acknowledge that I have been given the opportunity to request an in-person assessment or other available alternative prior to the telemedicine visit and am voluntarily participating in the telemedicine visit.  I understand that I have the right to withhold or withdraw my consent to the use of telemedicine in the course of my care at any time, without affecting my right to future care or treatment, and that the Practitioner or I may terminate the telemedicine visit at any time. I understand that I have the right to inspect all information obtained and/or recorded in the course of the telemedicine visit and may receive copies of available information for a reasonable fee.  I understand that some of the potential risks of receiving the Services via telemedicine include:  Delay or interruption in medical evaluation due to technological equipment failure or disruption; Information transmitted may not be sufficient (e.g. poor resolution of images) to allow for appropriate medical decision making by the Practitioner;  and/or  In rare instances, security protocols could fail, causing a breach of personal health information.  Furthermore, I acknowledge that it is my responsibility to provide information about my medical history, conditions and care that is complete and accurate to the best of my ability. I acknowledge that Practitioner's advice, recommendations, and/or decision may be based on factors not within their control, such as incomplete or inaccurate data provided by me or distortions of diagnostic images or specimens that may result from electronic transmissions. I understand that the practice of medicine is not an exact science and that Practitioner makes no warranties or guarantees regarding treatment outcomes. I acknowledge that a copy of this consent can be made available to me via my patient portal [facility] MyChart), or I can request a printed copy by calling the office of [facility].    I understand that my insurance will be billed for this visit.   I have read or had this consent read to me. I understand the contents of this consent, which adequately explains the benefits and risks of the Services being provided via telemedicine.  I have been provided ample opportunity to ask questions regarding this consent and the Services and have had my questions answered to my satisfaction. I give my informed consent for the services to be provided through the use of telemedicine in my medical care

## 2023-09-30 ENCOUNTER — Ambulatory Visit: Attending: Cardiovascular Disease | Admitting: Nurse Practitioner

## 2023-09-30 ENCOUNTER — Encounter: Payer: Self-pay | Admitting: Nurse Practitioner

## 2023-09-30 DIAGNOSIS — Z01810 Encounter for preprocedural cardiovascular examination: Secondary | ICD-10-CM | POA: Diagnosis not present

## 2023-09-30 NOTE — Progress Notes (Signed)
 Virtual Visit via Telephone Note   Because of Moua Rasmusson co-morbid illnesses, he is at least at moderate risk for complications without adequate follow up.  This format is felt to be most appropriate for this patient at this time.  Due to technical limitations with video connection (technology), today's appointment will be conducted as an audio only telehealth visit, and Jontavious Commons verbally agreed to proceed in this manner.   All issues noted in this document were discussed and addressed.  No physical exam could be performed with this format.  Evaluation Performed:  Preoperative cardiovascular risk assessment _____________   Date:  09/30/2023   Patient ID:  Billy Cummings, DOB 1971/02/23, MRN [id number] Patient Location:  Home Provider location:   Office  Primary Care Provider:  King, Jacqueline E, FNP Primary Cardiologist:  Kardie Tobb, DO  Chief Complaint / Patient Profile   53 y.o. y/o male with a h/o CAD s/p DES to RCA in 2012, DES to RCA x 2 in 2017, and DES to RCA 01/2021, hypertension, mixed dyslipidemia, obesity, tobacco use who is pending robotic low anterior resection with flexible sigmoidoscopy with Dr. Camilo Cella, date TBD and presents today for telephonic preoperative cardiovascular risk assessment.  History of Present Illness    Shondell Poulson is a 53 y.o. male who presents via audio/video conferencing for a telehealth visit today.  Pt was last seen in cardiology clinic on 07/17/23 by Dr. Emmette Harms.  At that time Challen Spainhour was doing well.  The patient is now pending procedure as outlined above. Since his last visit, he denies chest pain, shortness of breath, lower extremity edema, fatigue, palpitations, melena, hematuria, hemoptysis, diaphoresis, weakness, presyncope, syncope, orthopnea, and PND. He is able to achieve > 4 METS activity without concerning cardiac symptoms.    Past Medical History    Past Medical History:  Diagnosis Date   Anxiety    Arthritis    Cigarette smoker  08/29/2015   Coronary artery disease involving native coronary artery of native heart without angina pectoris 08/29/2015   Overview:  STEMI -late restenosis ,inferior wall--stenting of RCA x 2 08/15/15 and old PCI and stent of RCA in 2012   Depression    Diverticulitis    Dyslipidemia 08/29/2015   Essential hypertension 02/23/2020   History of myocardial infarction    History of TIA (transient ischemic attack) 2021   Hypertriglyceridemia 05/11/2017   Leukocytosis 07/17/2020   OSA (obstructive sleep apnea) 06/28/2020   Severe, AHI 40/hour-210 pounds   Restless leg syndrome    Past Surgical History:  Procedure Laterality Date   CARDIAC CATHETERIZATION     At [facility] s/p stent placment x2 followed by dr Sandee Crook   COLONOSCOPY  01/23/2014   Colonic polyps status post polypectomy. Moderate to severe sigmoid diverticulosis with evidence of recent diverticulitis and evidence of recurrent previous diverticulitis   CORONARY ANGIOPLASTY WITH STENT PLACEMENT Left 08/15/2015   CORONARY PRESSURE/FFR STUDY N/A 12/26/2021   Procedure: INTRAVASCULAR PRESSURE WIRE/FFR STUDY;  Surgeon: Odie Benne, MD;  Location: [facility] INVASIVE CV LAB;  Service: Cardiovascular;  Laterality: N/A;   CORONARY STENT INTERVENTION N/A 01/23/2021   Procedure: CORONARY STENT INTERVENTION;  Surgeon: Arnoldo Lapping, MD;  Location: [facility] INVASIVE CV LAB;  Service: Cardiovascular;  Laterality: N/A;   LEFT HEART CATH AND CORONARY ANGIOGRAPHY N/A 01/23/2021   Procedure: LEFT HEART CATH AND CORONARY ANGIOGRAPHY;  Surgeon: Arnoldo Lapping, MD;  Location: [facility] INVASIVE CV LAB;  Service: Cardiovascular;  Laterality: N/A;   RIGHT/LEFT HEART CATH AND  CORONARY ANGIOGRAPHY N/A 12/26/2021   Procedure: RIGHT/LEFT HEART CATH AND CORONARY ANGIOGRAPHY;  Surgeon: Odie Benne, MD;  Location: [facility] INVASIVE CV LAB;  Service: Cardiovascular;  Laterality: N/A;    Allergies  No Known Allergies  Home Medications    Prior to  Admission medications   Medication Sig Start Date End Date Taking? Authorizing Provider  aspirin  EC 81 MG tablet Take 81 mg by mouth at bedtime. Swallow whole.    [provider]  clopidogrel  (PLAVIX ) 75 MG tablet TAKE ONE TABLET BY MOUTH DAILY AT 6 AM 05/10/21   Tobb, Kardie, DO  losartan  (COZAAR ) 50 MG tablet Take 1 tablet (50 mg total) by mouth daily. 07/17/23 10/15/23  Tobb, Kardie, DO  nitroGLYCERIN  (NITROSTAT ) 0.4 MG SL tablet Place 0.4 mg under the tongue every 5 (five) minutes as needed for chest pain. 01/15/23   [provider]  rosuvastatin (CRESTOR) 20 MG tablet Take 20 mg by mouth daily. 01/15/23   [provider]  sodium chloride  flush 0.9 % SOLN injection Please flush drain once daily with 5 mL sterile saline. 12/27/22   Dow Gemma, PA  Tadalafil 2.5 MG TABS Take 1 tablet by mouth every morning. Patient not taking: Reported on 07/20/2023 01/15/23   [provider]  Vitamin D, Ergocalciferol, (DRISDOL) 1.25 MG (50000 UNIT) CAPS capsule Take 50,000 Units by mouth once a week. Patient not taking: Reported on 07/20/2023 01/15/23   [provider]    Physical Exam    Vital Signs:  Henri Baumler does not have vital signs available for review today.  Given telephonic nature of communication, physical exam is limited. AAOx3. NAD. Normal affect.  Speech and respirations are unlabored.  Accessory Clinical Findings    None  Assessment & Plan    1.  Preoperative Cardiovascular Risk Assessment: According to the Revised Cardiac Risk Index (RCRI), his Perioperative Risk of Major Cardiac Event is (%): 0.9. His Functional Capacity in METs is: 8.23 according to the Duke Activity Status Index (DASI). The patient is doing well from a cardiac perspective. Therefore, based on ACC/AHA guidelines, the patient would be at acceptable risk for the planned procedure without further cardiovascular testing.   The patient was advised that if he develops new symptoms  prior to surgery to contact our office to arrange for a follow-up visit, and he verbalized understanding.   Aspirin  can be held for 5-7 days prior to his surgery.  Please resume Aspirin  post operatively when it is felt to be safe from a bleeding standpoint.  Clopidogrel  (Plavix ) can be held for 5 days prior to his surgery and resumed as soon as possible post op.  A copy of this note will be routed to requesting surgeon.  Time:   Today, I have spent 10 minutes with the patient with telehealth technology discussing medical history, symptoms, and management plan.    Gerldine Koch, NP-C  09/30/2023, 8:59 AM [address], [address] [place], [place] [address] Office [phone] Fax [phone]

## 2023-10-15 ENCOUNTER — Other Ambulatory Visit: Payer: Self-pay | Admitting: Urology

## 2023-10-20 NOTE — Progress Notes (Signed)
 COVID Vaccine Completed:  Date of COVID positive in last 90 days:  PCP - Arlyne Novak, FNP Cardiologist - Kardie Todd, DO LOV 07/17/23  Cardiac clearance by Rosaline Bane, NP 09/30/23 in Epic  Chest x-ray -  EKG - 01/22/23 Epic Stress Test - 04/24/17 Epic ECHO - 01/17/21 Epic Cardiac Cath - 12/26/21 Epic Pacemaker/ICD device last checked: Spinal Cord Stimulator:  Bowel Prep - clears day before, Miralax, dulcolax, antibiotics  Sleep Study -  CPAP -   Fasting Blood Sugar -  Checks Blood Sugar _____ times a day  Last dose of GLP1 agonist-  N/A GLP1 instructions:  Do not take after     Last dose of SGLT-2 inhibitors-  N/A SGLT-2 instructions:  Do not take after     Blood Thinner Instructions:  Plavix , hold 5 days Aspirin  Instructions: ASA 81, hold 5-7 days Last Dose: 10/30/23  Activity level:  Can go up a flight of stairs and perform activities of daily living without stopping and without symptoms of chest pain or shortness of breath.  Able to exercise without symptoms  Unable to go up a flight of stairs without symptoms of     Anesthesia review: CAD with stents, HTN, OSA, TIA, MI, pulmonary nodule   Patient denies shortness of breath, fever, cough and chest pain at PAT appointment  Patient verbalized understanding of instructions that were given to them at the PAT appointment. Patient was also instructed that they will need to review over the PAT instructions again at home before surgery.

## 2023-10-20 NOTE — Progress Notes (Signed)
 Please place orders for PAT appointment scheduled 10/26/23.

## 2023-10-20 NOTE — Patient Instructions (Addendum)
 SURGICAL WAITING ROOM VISITATION  Patients having surgery or a procedure may have no more than 2 support people in the waiting area - these visitors may rotate.    Children under the age of 49 must have an adult with them who is not the patient.  Visitors with respiratory illnesses are discouraged from visiting and should remain at home.  If the patient needs to stay at the hospital during part of their recovery, the visitor guidelines for inpatient rooms apply. Pre-op nurse will coordinate an appropriate time for 1 support person to accompany patient in pre-op.  This support person may not rotate.    Please refer to the [facility] website for the visitor guidelines for Inpatients (after your surgery is over and you are in a regular room).    Your procedure is scheduled on: 11/05/23   Report to [facility] Main Entrance    Report to admitting at 5:15 AM   Call this number if you have problems the morning of surgery [phone]   Follow a clear liquid diet the day before surgery             Follow bowel prep per MD office  Water Non-Citrus Juices (without pulp, NO RED-Apple, White grape, White cranberry) Black Coffee (NO MILK/CREAM OR CREAMERS, sugar ok)  Clear Tea (NO MILK/CREAM OR CREAMERS, sugar ok) regular and decaf                             Plain Jell-O (NO RED)                                           Fruit ices (not with fruit pulp, NO RED)                                     Popsicles (NO RED)                                                               Sports drinks like Gatorade (NO RED)              Drink 2 Ensure/G2 drinks AT 10:00 PM the night before surgery.        The day of surgery:  Drink ONE (1) Pre-Surgery Clear Ensure or G2 BY     0430   AM the morning of surgery. Drink in one sitting. Do not sip.  This drink was given to you during your hospital  pre-op appointment visit. Nothing else to drink after completing the  Pre-Surgery Clear Ensure or  G2.          If you have questions, please contact your surgeon's office.   FOLLOW BOWEL PREP AND ANY ADDITIONAL PRE OP INSTRUCTIONS YOU RECEIVED FROM YOUR SURGEON'S OFFICE!!!     Oral Hygiene is also important to reduce your risk of infection.  Remember - BRUSH YOUR TEETH THE MORNING OF SURGERY WITH YOUR REGULAR TOOTHPASTE  DENTURES WILL BE REMOVED PRIOR TO SURGERY PLEASE DO NOT APPLY Poly grip OR ADHESIVES!!!   Do NOT smoke after Midnight   Stop all vitamins and herbal supplements 7 days before surgery.   Take these medicines the morning of surgery with A SIP OF WATER: Rosuvastatin , zetia, loratadine  DO NOT TAKE ANY ORAL DIABETIC MEDICATIONS DAY OF YOUR SURGERY  Bring CPAP mask and tubing day of surgery.                              You may not have any metal on your body including hair pins, jewelry, and body piercing             Do not wear lotions, powders, cologne, or deodorant              Men may shave face and neck.   Do not bring valuables to the hospital. [facility] IS NOT             RESPONSIBLE   FOR VALUABLES.   Contacts, glasses, dentures or bridgework may not be worn into surgery.   Bring small overnight bag day of surgery.   DO NOT BRING YOUR HOME MEDICATIONS TO THE HOSPITAL. PHARMACY WILL DISPENSE MEDICATIONS LISTED ON YOUR MEDICATION LIST TO YOU DURING YOUR ADMISSION IN THE HOSPITAL!    Patients discharged on the day of surgery will not be allowed to drive home.  Someone NEEDS to stay with you for the first 24 hours after anesthesia.   Special Instructions: Bring a copy of your healthcare power of attorney and living will documents the day of surgery if you haven't scanned them before.              Please read over the following fact sheets you were given: IF YOU HAVE QUESTIONS ABOUT YOUR PRE-OP INSTRUCTIONS PLEASE CALL [phone]   IIf you test positive for Covid or have been in contact with anyone that has  tested positive in the last 10 days please notify you surgeon.    [facility] - Preparing for Surgery Before surgery, you can play an important role.  Because skin is not sterile, your skin needs to be as free of germs as possible.  You can reduce the number of germs on your skin by washing with CHG (chlorahexidine gluconate) soap before surgery.  CHG is an antiseptic cleaner which kills germs and bonds with the skin to continue killing germs even after washing. Please DO NOT use if you have an allergy to CHG or antibacterial soaps.  If your skin becomes reddened/irritated stop using the CHG and inform your nurse when you arrive at Short Stay. Do not shave (including legs and underarms) for at least 48 hours prior to the first CHG shower.  You may shave your face/neck.  Please follow these instructions carefully:  1.  Shower with CHG Soap the night before surgery and the  morning of surgery.  2.  If you choose to wash your hair, wash your hair first as usual with your normal  shampoo.  3.  After you shampoo, rinse your hair and body thoroughly to remove the shampoo.                             4.  Use CHG as you would  any other liquid soap.  You can apply chg directly to the skin and wash.  Gently with a scrungie or clean washcloth.  5.  Apply the CHG Soap to your body ONLY FROM THE NECK DOWN.   Do   not use on face/ open                           Wound or open sores. Avoid contact with eyes, ears mouth and   genitals (private parts).                       Wash face,  Genitals (private parts) with your normal soap.             6.  Wash thoroughly, paying special attention to the area where your    surgery  will be performed.  7.  Thoroughly rinse your body with warm water from the neck down.  8.  DO NOT shower/wash with your normal soap after using and rinsing off the CHG Soap.                9.  Pat yourself dry with a clean towel.            10.  Wear clean pajamas.            11.  Place clean  sheets on your bed the night of your first shower and do not  sleep with pets. Day of Surgery : Do not apply any lotions/deodorants the morning of surgery.  Please wear clean clothes to the hospital/surgery center.  FAILURE TO FOLLOW THESE INSTRUCTIONS MAY RESULT IN THE CANCELLATION OF YOUR SURGERY  PATIENT SIGNATURE_________________________________  NURSE SIGNATURE__________________________________  ________________________________________________________________________

## 2023-10-21 ENCOUNTER — Ambulatory Visit: Payer: Self-pay | Admitting: Surgery

## 2023-10-21 DIAGNOSIS — Z01818 Encounter for other preprocedural examination: Secondary | ICD-10-CM

## 2023-10-21 NOTE — Progress Notes (Signed)
 Sent message, via epic in basket, requesting orders in epic from Careers adviser.

## 2023-10-26 ENCOUNTER — Encounter (HOSPITAL_COMMUNITY): Payer: Self-pay

## 2023-10-26 ENCOUNTER — Encounter (HOSPITAL_COMMUNITY)
Admission: RE | Admit: 2023-10-26 | Discharge: 2023-10-26 | Disposition: A | Discharge status: Home / Self Care | Source: Ambulatory Visit | Attending: Surgery | Admitting: Surgery

## 2023-10-26 ENCOUNTER — Other Ambulatory Visit: Payer: Self-pay

## 2023-10-26 VITALS — BP 134/87 | HR 75 | Temp 98.0°F | Resp 16 | Ht 74.0 in | Wt 227.0 lb

## 2023-10-26 DIAGNOSIS — R739 Hyperglycemia, unspecified: Secondary | ICD-10-CM | POA: Diagnosis not present

## 2023-10-26 DIAGNOSIS — G4733 Obstructive sleep apnea (adult) (pediatric): Secondary | ICD-10-CM | POA: Insufficient documentation

## 2023-10-26 DIAGNOSIS — I2510 Atherosclerotic heart disease of native coronary artery without angina pectoris: Secondary | ICD-10-CM | POA: Diagnosis not present

## 2023-10-26 DIAGNOSIS — Z8673 Personal history of transient ischemic attack (TIA), and cerebral infarction without residual deficits: Secondary | ICD-10-CM | POA: Insufficient documentation

## 2023-10-26 DIAGNOSIS — I10 Essential (primary) hypertension: Secondary | ICD-10-CM | POA: Insufficient documentation

## 2023-10-26 DIAGNOSIS — I252 Old myocardial infarction: Secondary | ICD-10-CM | POA: Diagnosis not present

## 2023-10-26 DIAGNOSIS — Z955 Presence of coronary angioplasty implant and graft: Secondary | ICD-10-CM | POA: Insufficient documentation

## 2023-10-26 DIAGNOSIS — J449 Chronic obstructive pulmonary disease, unspecified: Secondary | ICD-10-CM | POA: Diagnosis not present

## 2023-10-26 DIAGNOSIS — Z01818 Encounter for other preprocedural examination: Secondary | ICD-10-CM

## 2023-10-26 DIAGNOSIS — Z01812 Encounter for preprocedural laboratory examination: Secondary | ICD-10-CM | POA: Diagnosis present

## 2023-10-26 DIAGNOSIS — K5792 Diverticulitis of intestine, part unspecified, without perforation or abscess without bleeding: Secondary | ICD-10-CM | POA: Insufficient documentation

## 2023-10-26 HISTORY — DX: Chronic obstructive pulmonary disease, unspecified: J44.9

## 2023-10-26 HISTORY — DX: Acute myocardial infarction, unspecified: I21.9

## 2023-10-26 HISTORY — DX: Prediabetes: R73.03

## 2023-10-26 LAB — CBC WITH DIFFERENTIAL/PLATELET
Abs Immature Granulocytes: 0.06 K/uL (ref 0.00–0.07)
Basophils Absolute: 0.1 K/uL (ref 0.0–0.1)
Basophils Relative: 1 %
Eosinophils Absolute: 0.3 K/uL (ref 0.0–0.5)
Eosinophils Relative: 3 %
HCT: 50.5 % (ref 39.0–52.0)
Hemoglobin: 16.8 g/dL (ref 13.0–17.0)
Immature Granulocytes: 1 %
Lymphocytes Relative: 41 %
Lymphs Abs: 4.8 K/uL — ABNORMAL HIGH (ref 0.7–4.0)
MCH: 29 pg (ref 26.0–34.0)
MCHC: 33.3 g/dL (ref 30.0–36.0)
MCV: 87.1 fL (ref 80.0–100.0)
Monocytes Absolute: 0.9 K/uL (ref 0.1–1.0)
Monocytes Relative: 8 %
Neutro Abs: 5.5 K/uL (ref 1.7–7.7)
Neutrophils Relative %: 46 %
Platelets: 348 K/uL (ref 150–400)
RBC: 5.8 MIL/uL (ref 4.22–5.81)
RDW: 14.3 % (ref 11.5–15.5)
WBC: 11.7 K/uL — ABNORMAL HIGH (ref 4.0–10.5)
nRBC: 0 % (ref 0.0–0.2)

## 2023-10-26 LAB — COMPREHENSIVE METABOLIC PANEL WITH GFR
ALT: 27 U/L (ref 0–44)
AST: 22 U/L (ref 15–41)
Albumin: 3.9 g/dL (ref 3.5–5.0)
Alkaline Phosphatase: 106 U/L (ref 38–126)
Anion gap: 11 (ref 5–15)
BUN: 12 mg/dL (ref 6–20)
CO2: 24 mmol/L (ref 22–32)
Calcium: 8.9 mg/dL (ref 8.9–10.3)
Chloride: 101 mmol/L (ref 98–111)
Creatinine, Ser: 1.14 mg/dL (ref 0.61–1.24)
GFR, Estimated: >60 mL/min (ref >60)
Glucose, Bld: 97 mg/dL (ref 70–99)
Potassium: 4.3 mmol/L (ref 3.5–5.1)
Sodium: 136 mmol/L (ref 135–145)
Total Bilirubin: 0.5 mg/dL (ref 0.0–1.2)
Total Protein: 7.2 g/dL (ref 6.5–8.1)

## 2023-10-26 NOTE — Progress Notes (Addendum)
 Activity -Able to climb a flight of stairs with no CP or SOB  Pt. Aware of bowel prep-yes  Pt. Aware to hold Plavix  and asa-pt. Aware 5 days

## 2023-10-27 NOTE — Anesthesia Preprocedure Evaluation (Addendum)
 Anesthesia Evaluation  Patient identified by MRN, date of birth, ID band Patient awake    Reviewed: Allergy & Precautions, NPO status , Patient's Chart, lab work & pertinent test results  Airway Mallampati: I  TM Distance: >3 FB Neck ROM: Full    Dental  (+) Edentulous Upper, Dental Advisory Given   Pulmonary sleep apnea , COPD, Current Smoker and Patient abstained from smoking.    + decreased breath sounds+ wheezing      Cardiovascular hypertension, Pt. on medications + CAD, + Past MI and + Cardiac Stents   Rhythm:Regular Rate:Normal  Echo:   1. Left ventricular ejection fraction, by estimation, is 50 to 55%. The  left ventricle has low normal function. The left ventricle has no regional  wall motion abnormalities. There is moderate concentric left ventricular  hypertrophy. Left ventricular  diastolic parameters are consistent with Grade I diastolic dysfunction  (impaired relaxation). The average left ventricular global longitudinal  strain is -10.7 %. The global longitudinal strain is abnormal.   2. Right ventricular systolic function is normal. The right ventricular  size is normal.   3. The mitral valve is normal in structure. No evidence of mitral valve  regurgitation. No evidence of mitral stenosis.   4. The aortic valve is tricuspid. Aortic valve regurgitation is not  visualized. No aortic stenosis is present.   5. The inferior vena cava is normal in size with greater than 50%  respiratory variability, suggesting right atrial pressure of 3 mmHg.     Neuro/Psych  PSYCHIATRIC DISORDERS Anxiety Depression    negative neurological ROS     GI/Hepatic negative GI ROS, Neg liver ROS,,,  Endo/Other  negative endocrine ROS    Renal/GU negative Renal ROS     Musculoskeletal  (+) Arthritis ,    Abdominal   Peds  Hematology negative hematology ROS (+)   Anesthesia Other Findings   Reproductive/Obstetrics                               Anesthesia Physical Anesthesia Plan  ASA: 3  Anesthesia Plan: General   Post-op Pain Management: Tylenol  PO (pre-op)*   Induction: Intravenous  PONV Risk Score and Plan: 2 and Ondansetron , Dexamethasone  and Midazolam   Airway Management Planned: Oral ETT  Additional Equipment:   Intra-op Plan:   Post-operative Plan: Extubation in OR  Informed Consent: I have reviewed the patients History and Physical, chart, labs and discussed the procedure including the risks, benefits and alternatives for the proposed anesthesia with the patient or authorized representative who has indicated his/her understanding and acceptance.     Dental advisory given  Plan Discussed with: CRNA  Anesthesia Plan Comments: (See PAT note from 7/7  Breathing tx in Short stay.)         Anesthesia Quick Evaluation

## 2023-10-27 NOTE — Progress Notes (Signed)
 Case: [id number] Date/Time: 11/05/23 0715   Procedures:      RESECTION, RECTUM, LOW ANTERIOR, ROBOT-ASSISTED - ROBOTIC LOW ANTERIOR RESECTION     SIGMOIDOSCOPY, FLEXIBLE     CYSTOSCOPY WITH INDOCYANINE GREEN IMAGING (ICG)   Anesthesia type: General   Diagnosis: Diverticulitis [K57.92]   Pre-op diagnosis: DIVERTICULITIS   Location: [facility] ROOM 02 / [facility]   Surgeons: Teresa Lonni HERO, MD; Shane Steffan BROCKS, MD       DISCUSSION: Billy Cummings is a 53 yo male who presents to PAT prior to surgery above. PMH of every day smoking, hx of STEMI and CAD s/p PCI to RCA (2012, 2017, 2022), HTN, severe OSA (no CPAP), COPD, hx of TIA (2021), prediabetes, arthritis, anxiety, depression, chronic leukocytosis.  Patient follows with Cardiology due to hx of STEMI in 2017 and hx of CAD s/p PCI multiple times, most recently in 2022 due to in-stent restenosis of the RCA treated with DES. Last cath was in 2023 and showed non obstructive disease. Pt last seen in clinic on 07/17/23. Losartan  dose was increased due to elevated BP. He was cleared for colonoscopy which was on 07/20/23. No complications noted. He had another cardiac clearance tele visit on 09/30/23 and was cleared for surgery:  Preoperative Cardiovascular Risk Assessment: According to the Revised Cardiac Risk Index (RCRI), his Perioperative Risk of Major Cardiac Event is (%): 0.9. His Functional Capacity in METs is: 8.23 according to the Duke Activity Status Index (DASI). The patient is doing well from a cardiac perspective. Therefore, based on ACC/AHA guidelines, the patient would be at acceptable risk for the planned procedure without further cardiovascular testing.    Aspirin  can be held for 5-7 days prior to his surgery.  Please resume Aspirin  post operatively when it is felt to be safe from a bleeding standpoint.  Clopidogrel  (Plavix ) can be held for 5 days prior to his surgery and resumed as soon as possible post op  Pt with hx of COPD. Seen by  Pulmonology and per notes his COPD is not severe by spirometry although pt is still a 1 pack a day smoker. He does have severe OSA. No CPAP use documented  Has chronic leukocytosis. Evaluated by hematology in 2022. Thought to be due to smoking.  LD Plavix : 7/11  VS: BP 134/87   Pulse 75   Temp 36.7 C (Oral)   Resp 16   Ht 6' 2 (1.88 m)   Wt 103 kg   SpO2 98%   BMI 29.15 kg/m   PROVIDERS: King, Jacqueline E, FNP   LABS: Labs reviewed: Acceptable for surgery. (all labs ordered are listed, but only abnormal results are displayed)  Labs Reviewed  CBC WITH DIFFERENTIAL/PLATELET - Abnormal; Notable for the following components:      Result Value   WBC 11.7 (*)    Lymphs Abs 4.8 (*)    All other components within normal limits  COMPREHENSIVE METABOLIC PANEL WITH GFR  TYPE AND SCREEN     IMAGES:   EKG:   CV  Right/Left heart cath 12/26/2021:    Prox RCA lesion is 20% stenosed.   Mid RCA to Dist RCA lesion is 20% stenosed.   Mid Cx lesion is 50% stenosed.   Mid LAD lesion is 30% stenosed.   Patent proximal and mid RCA stents with minimal restenosis Mild non-obstructive plaque in the mid LAD Moderate mid Circumflex stenosis (RFR 0.99-suggesting stenosis is not flow limiting) Normal right and left heart pressures   Recommendations: Continue medical  management of CAD.   Echo 01/17/2021:  IMPRESSIONS    1. Left ventricular ejection fraction, by estimation, is 50 to 55%. The left ventricle has low normal function. The left ventricle has no regional wall motion abnormalities. There is moderate concentric left ventricular hypertrophy. Left ventricular diastolic parameters are consistent with Grade I diastolic dysfunction (impaired relaxation). The average left ventricular global longitudinal strain is -10.7 %. The global longitudinal strain is abnormal.  2. Right ventricular systolic function is normal. The right ventricular size is normal.  3. The mitral valve is  normal in structure. No evidence of mitral valve regurgitation. No evidence of mitral stenosis.  4. The aortic valve is tricuspid. Aortic valve regurgitation is not visualized. No aortic stenosis is present.  5. The inferior vena cava is normal in size with greater than 50% respiratory variability, suggesting right atrial pressure of 3 mmHg.  Past Medical History:  Diagnosis Date   Anxiety    Arthritis    Cigarette smoker 08/29/2015   COPD (chronic obstructive pulmonary disease) (HCC)    Coronary artery disease involving native coronary artery of native heart without angina pectoris 08/29/2015   Overview:  STEMI -late restenosis ,inferior wall--stenting of RCA x 2 08/15/15 and old PCI and stent of RCA in 2012   Depression    Diverticulitis    Dyslipidemia 08/29/2015   Essential hypertension 02/23/2020   History of myocardial infarction    History of TIA (transient ischemic attack) 2021   Hypertriglyceridemia 05/11/2017   Leukocytosis 07/17/2020   Myocardial infarction (HCC)    OSA (obstructive sleep apnea) 06/28/2020   Severe, AHI 40/hour-210 pounds    no cpap   Pre-diabetes    Restless leg syndrome     Past Surgical History:  Procedure Laterality Date   CARDIAC CATHETERIZATION     At [facility] regional s/p stent placment x2 followed by dr monetta   COLONOSCOPY  01/23/2014   Colonic polyps status post polypectomy. Moderate to severe sigmoid diverticulosis with evidence of recent diverticulitis and evidence of recurrent previous diverticulitis   CORONARY ANGIOPLASTY WITH STENT PLACEMENT Left 08/15/2015   CORONARY PRESSURE/FFR STUDY N/A 12/26/2021   Procedure: INTRAVASCULAR PRESSURE WIRE/FFR STUDY;  Surgeon: Verlin Lonni BIRCH, MD;  Location: [facility] IN[facility]AS[facility] CV LAB;  Service: Cardiovascular;  Laterality: N/A;   CORONARY STENT INTERVENTION N/A 01/23/2021   Procedure: CORONARY STENT INTERVENTION;  Surgeon: Wonda Sharper, MD;  Location: [facility] INVASIVE CV LAB;  Service: Cardiovascular;   Laterality: N/A;   LEFT HEART CATH AND CORONARY ANGIOGRAPHY N/A 01/23/2021   Procedure: LEFT HEART CATH AND CORONARY ANGIOGRAPHY;  Surgeon: Wonda Sharper, MD;  Location: [facility] INVASIVE CV LAB;  Service: Cardiovascular;  Laterality: N/A;   RIGHT/LEFT HEART CATH AND CORONARY ANGIOGRAPHY N/A 12/26/2021   Procedure: RIGHT/LEFT HEART CATH AND CORONARY ANGIOGRAPHY;  Surgeon: Verlin Lonni BIRCH, MD;  Location: [facility] INVASIVE CV LAB;  Service: Cardiovascular;  Laterality: N/A;    MEDICATIONS:  aspirin  EC 81 MG tablet   clopidogrel  (PLAVIX ) 75 MG tablet   ezetimibe (ZETIA) 10 MG tablet   folic acid (FOLVITE) 1 MG tablet   loratadine (CLARITIN) 10 MG tablet   losartan  (COZAAR ) 50 MG tablet   nitroGLYCERIN  (NITROSTAT ) 0.4 MG SL tablet   OZEMPIC, 0.25 OR 0.5 MG/DOSE, 2 MG/3ML SOPN   rosuvastatin (CRESTOR) 40 MG tablet   tadalafil (CIALIS) 5 MG tablet   Vitamin D, Ergocalciferol, (DRISDOL) 1.25 MG (50000 UNIT) CAPS capsule    sodium chloride  flush (NS) 0.9 % injection 3 mL  Burnard CHRISTELLA Odis DEVONNA [facility] Short Stay/Anesthesiology [facility] Phone [phone] 10/27/2023 10:49 AM

## 2023-11-03 NOTE — H&P (Signed)
 H&P Physician requesting consult: Billy Cummings  Chief Complaint: Sigmoid diverticulits   History of Present Illness: 55 M w/ sigmoid diverticulitis he is here today for low anterior resection. Dr. Pizza has requested pre operative ICG instillation in the ureters.   Past Medical History:  Diagnosis Date   Anxiety    Arthritis    Cigarette smoker 08/29/2015   COPD (chronic obstructive pulmonary disease) (HCC)    Coronary artery disease involving native coronary artery of native heart without angina pectoris 08/29/2015   Overview:  STEMI -late restenosis ,inferior wall--stenting of RCA x 2 08/15/15 and old PCI and stent of RCA in 2012   Depression    Diverticulitis    Dyslipidemia 08/29/2015   Essential hypertension 02/23/2020   History of myocardial infarction    History of TIA (transient ischemic attack) 2021   Hypertriglyceridemia 05/11/2017   Leukocytosis 07/17/2020   Myocardial infarction (HCC)    OSA (obstructive sleep apnea) 06/28/2020   Severe, AHI 40/hour-210 pounds    no cpap   Pre-diabetes    Restless leg syndrome    Past Surgical History:  Procedure Laterality Date   CARDIAC CATHETERIZATION     At [facility] regional s/p stent placment x2 followed by dr monetta   COLONOSCOPY  01/23/2014   Colonic polyps status post polypectomy. Moderate to severe sigmoid diverticulosis with evidence of recent diverticulitis and evidence of recurrent previous diverticulitis   CORONARY ANGIOPLASTY WITH STENT PLACEMENT Left 08/15/2015   CORONARY PRESSURE/FFR STUDY N/A 12/26/2021   Procedure: INTRAVASCULAR PRESSURE WIRE/FFR STUDY;  Surgeon: Verlin Lonni BIRCH, MD;  Location: [facility] INVAS[facility] CV LAB;  Service: Cardiovascular;  Laterality: N/A;   CORONARY STENT INTERVENTION N/A 01/23/2021   Procedure: CORONARY STENT INTERVENTION;  Surgeon: Wonda Sharper, MD;  Location: [facility] INVASIVE CV LAB;  Service: Cardiovascular;  Laterality: N/A;   LEFT HEART CATH AND CORONARY ANGIOGRAPHY N/A 01/23/2021    Procedure: LEFT HEART CATH AND CORONARY ANGIOGRAPHY;  Surgeon: Wonda Sharper, MD;  Location: [facility] INVASIVE CV LAB;  Service: Cardiovascular;  Laterality: N/A;   RIGHT/LEFT HEART CATH AND CORONARY ANGIOGRAPHY N/A 12/26/2021   Procedure: RIGHT/LEFT HEART CATH AND CORONARY ANGIOGRAPHY;  Surgeon: Verlin Lonni BIRCH, MD;  Location: [facility]VAS[facility] CV LAB;  Service: Cardiovascular;  Laterality: N/A;    Home Medications:  No medications prior to admission.   Allergies: No Known Allergies  Family History  Problem Relation Age of Onset   Heart attack Mother    Heart disease Mother    Breast cancer Mother    Heart attack Father    Heart disease Father    Heart attack Brother    Heart disease Brother    Bladder Cancer Brother    Colon cancer Neg Hx    Stomach cancer Neg Hx    Pancreatic cancer Neg Hx    Esophageal cancer Neg Hx    Social History:  reports that he has been smoking cigarettes. He has a 57 pack-year smoking history. He has never used smokeless tobacco. He reports that he does not drink alcohol and does not use drugs.  ROS: A complete review of systems was performed.  All systems are negative except for pertinent findings as noted. ROS   Physical Exam:  Vital signs in last 24 hours:   General:  Alert and oriented, No acute distress HEENT: Normocephalic, atraumatic Neck: No JVD or lymphadenopathy Cardiovascular: Regular rate and rhythm Lungs: Regular rate and effort Abdomen: Soft, nontender, nondistended, no abdominal masses Back: No CVA tenderness Extremities: No edema  Neurologic: Grossly intact  Laboratory Data:  No results found for this or any previous visit (from the past 24 hours). No results found for this or any previous visit (from the past 240 hours). Creatinine: No results for input(s): CREATININE in the last 168 hours.  Impression/Assessment:  26 M w/ sigmoid diverticulitis he is here today for low anterior resection. Dr. Teresa has requested pre  operative ICG instillation in the ureters.   We discussed risk benefits alternatives to firefly instillation.  This included bleeding infection and damage to surrounding structures surrounding structures including ureter as well as urethra.  We discussed the need for stent postoperatively as well as the potential symptoms of stent placement.  We discussed possible inability to complete procedure due to caliber of your ureter. Patient voiced their understanding and consent was obtained.   Plan:  - proceed with firefly instillation   Billy Cummings 11/03/2023, 2:57 PM

## 2023-11-05 ENCOUNTER — Encounter (HOSPITAL_COMMUNITY)
Admission: RE | Disposition: A | Discharge status: Home / Self Care | Payer: Self-pay | Source: Home / Self Care | Attending: Surgery

## 2023-11-05 ENCOUNTER — Inpatient Hospital Stay (HOSPITAL_COMMUNITY)

## 2023-11-05 ENCOUNTER — Other Ambulatory Visit: Payer: Self-pay

## 2023-11-05 ENCOUNTER — Encounter (HOSPITAL_COMMUNITY): Payer: Self-pay | Admitting: Urology

## 2023-11-05 ENCOUNTER — Inpatient Hospital Stay (HOSPITAL_COMMUNITY): Payer: Self-pay

## 2023-11-05 ENCOUNTER — Inpatient Hospital Stay (HOSPITAL_COMMUNITY)
Admission: RE | Admit: 2023-11-05 | Discharge: 2023-11-07 | DRG: 331 | Disposition: A | Discharge status: Home / Self Care | Attending: Surgery | Admitting: Surgery

## 2023-11-05 ENCOUNTER — Inpatient Hospital Stay (HOSPITAL_COMMUNITY): Payer: Self-pay | Admitting: Medical

## 2023-11-05 DIAGNOSIS — I2510 Atherosclerotic heart disease of native coronary artery without angina pectoris: Secondary | ICD-10-CM

## 2023-11-05 DIAGNOSIS — Z8673 Personal history of transient ischemic attack (TIA), and cerebral infarction without residual deficits: Secondary | ICD-10-CM | POA: Diagnosis not present

## 2023-11-05 DIAGNOSIS — J439 Emphysema, unspecified: Secondary | ICD-10-CM | POA: Diagnosis present

## 2023-11-05 DIAGNOSIS — E781 Pure hyperglyceridemia: Secondary | ICD-10-CM | POA: Diagnosis present

## 2023-11-05 DIAGNOSIS — Z8249 Family history of ischemic heart disease and other diseases of the circulatory system: Secondary | ICD-10-CM

## 2023-11-05 DIAGNOSIS — Z7982 Long term (current) use of aspirin: Secondary | ICD-10-CM

## 2023-11-05 DIAGNOSIS — I252 Old myocardial infarction: Secondary | ICD-10-CM | POA: Diagnosis not present

## 2023-11-05 DIAGNOSIS — I10 Essential (primary) hypertension: Secondary | ICD-10-CM

## 2023-11-05 DIAGNOSIS — G4733 Obstructive sleep apnea (adult) (pediatric): Secondary | ICD-10-CM | POA: Diagnosis present

## 2023-11-05 DIAGNOSIS — Z803 Family history of malignant neoplasm of breast: Secondary | ICD-10-CM | POA: Diagnosis not present

## 2023-11-05 DIAGNOSIS — Z716 Tobacco abuse counseling: Secondary | ICD-10-CM

## 2023-11-05 DIAGNOSIS — K5732 Diverticulitis of large intestine without perforation or abscess without bleeding: Secondary | ICD-10-CM | POA: Diagnosis present

## 2023-11-05 DIAGNOSIS — Z860100 Personal history of colon polyps, unspecified: Secondary | ICD-10-CM | POA: Diagnosis not present

## 2023-11-05 DIAGNOSIS — K5720 Diverticulitis of large intestine with perforation and abscess without bleeding: Principal | ICD-10-CM | POA: Diagnosis present

## 2023-11-05 DIAGNOSIS — Z8052 Family history of malignant neoplasm of bladder: Secondary | ICD-10-CM | POA: Diagnosis not present

## 2023-11-05 DIAGNOSIS — Z79899 Other long term (current) drug therapy: Secondary | ICD-10-CM

## 2023-11-05 DIAGNOSIS — F17210 Nicotine dependence, cigarettes, uncomplicated: Secondary | ICD-10-CM | POA: Diagnosis present

## 2023-11-05 DIAGNOSIS — Z7902 Long term (current) use of antithrombotics/antiplatelets: Secondary | ICD-10-CM

## 2023-11-05 DIAGNOSIS — Z955 Presence of coronary angioplasty implant and graft: Secondary | ICD-10-CM

## 2023-11-05 DIAGNOSIS — Z9049 Acquired absence of other specified parts of digestive tract: Principal | ICD-10-CM

## 2023-11-05 HISTORY — PX: XI ROBOTIC ASSISTED LOWER ANTERIOR RESECTION: SHX6558

## 2023-11-05 HISTORY — PX: CYSTOSCOPY WITH INDOCYANINE GREEN IMAGING (ICG): SHX7549

## 2023-11-05 HISTORY — PX: FLEXIBLE SIGMOIDOSCOPY: SHX5431

## 2023-11-05 LAB — TYPE AND SCREEN
ABO/RH(D): O POS
Antibody Screen: NEGATIVE

## 2023-11-05 LAB — ABO/RH: ABO/RH(D): O POS

## 2023-11-05 SURGERY — CYSTOSCOPY WITH INDOCYANINE GREEN IMAGING (ICG)
Anesthesia: General | Site: Abdomen

## 2023-11-05 MED ORDER — MEPERIDINE HCL 25 MG/ML IJ SOLN
6.2500 mg | INTRAMUSCULAR | Status: DC | PRN
  Filled 2023-11-05: qty 1

## 2023-11-05 MED ORDER — ONDANSETRON HCL 4 MG/2ML IJ SOLN
INTRAMUSCULAR | Status: DC | PRN
  Administered 2023-11-05: 4 mg via INTRAVENOUS

## 2023-11-05 MED ORDER — DEXAMETHASONE SODIUM PHOSPHATE 10 MG/ML IJ SOLN
INTRAMUSCULAR | Status: DC | PRN
  Administered 2023-11-05: 5 mg via INTRAVENOUS

## 2023-11-05 MED ORDER — MIDAZOLAM HCL 2 MG/2ML IJ SOLN
INTRAMUSCULAR | Status: AC
  Filled 2023-11-05: qty 2

## 2023-11-05 MED ORDER — LORATADINE 10 MG PO TABS
10.0000 mg | ORAL_TABLET | Freq: Every day | ORAL | Status: DC
  Administered 2023-11-06 – 2023-11-07 (×2): 10 mg via ORAL
  Filled 2023-11-05 (×2): qty 1

## 2023-11-05 MED ORDER — ROCURONIUM BROMIDE 10 MG/ML (PF) SYRINGE
PREFILLED_SYRINGE | INTRAVENOUS | Status: DC | PRN
  Administered 2023-11-05: 20 mg via INTRAVENOUS
  Administered 2023-11-05: 15 mg via INTRAVENOUS
  Administered 2023-11-05: 50 mg via INTRAVENOUS

## 2023-11-05 MED ORDER — HYDROMORPHONE HCL 1 MG/ML IJ SOLN
INTRAMUSCULAR | Status: AC
  Filled 2023-11-05: qty 1

## 2023-11-05 MED ORDER — ONDANSETRON HCL 4 MG PO TABS: 4.0000 mg | ORAL_TABLET | Freq: Four times a day (QID) | ORAL | Status: DC | PRN

## 2023-11-05 MED ORDER — ALVIMOPAN 12 MG PO CAPS
12.0000 mg | ORAL_CAPSULE | ORAL | Status: AC
  Administered 2023-11-05: 12 mg via ORAL
  Filled 2023-11-05: qty 1

## 2023-11-05 MED ORDER — STERILE WATER FOR INJECTION IJ SOLN
INTRAMUSCULAR | Status: DC | PRN
  Administered 2023-11-05: 15 mL via INTRAMUSCULAR

## 2023-11-05 MED ORDER — BUPIVACAINE-EPINEPHRINE (PF) 0.25% -1:200000 IJ SOLN
INTRAMUSCULAR | Status: DC | PRN
  Administered 2023-11-05: 60 mL

## 2023-11-05 MED ORDER — HYDROMORPHONE HCL 1 MG/ML IJ SOLN
0.5000 mg | INTRAMUSCULAR | Status: DC | PRN
  Administered 2023-11-05 – 2023-11-06 (×4): 0.5 mg via INTRAVENOUS
  Filled 2023-11-05 (×4): qty 0.5

## 2023-11-05 MED ORDER — METHYLENE BLUE (ANTIDOTE) 1 % IV SOLN
INTRAVENOUS | Status: AC
  Filled 2023-11-05: qty 10

## 2023-11-05 MED ORDER — FENTANYL CITRATE (PF) 100 MCG/2ML IJ SOLN
INTRAMUSCULAR | Status: AC
  Filled 2023-11-05: qty 2

## 2023-11-05 MED ORDER — LIDOCAINE HCL (PF) 2 % IJ SOLN
INTRAMUSCULAR | Status: AC
  Filled 2023-11-05: qty 5

## 2023-11-05 MED ORDER — LACTATED RINGERS IV SOLN: INTRAVENOUS | Status: DC

## 2023-11-05 MED ORDER — ACETAMINOPHEN 500 MG PO TABS
1000.0000 mg | ORAL_TABLET | ORAL | Status: AC
  Administered 2023-11-05: 1000 mg via ORAL
  Filled 2023-11-05: qty 2

## 2023-11-05 MED ORDER — PROPOFOL 10 MG/ML IV BOLUS
INTRAVENOUS | Status: AC
Start: 2023-11-05 — End: 2023-11-05
  Filled 2023-11-05: qty 20

## 2023-11-05 MED ORDER — BISACODYL 5 MG PO TBEC: 20.0000 mg | DELAYED_RELEASE_TABLET | Freq: Once | ORAL | Status: DC

## 2023-11-05 MED ORDER — EZETIMIBE 10 MG PO TABS
10.0000 mg | ORAL_TABLET | Freq: Every day | ORAL | Status: DC
Start: 2023-11-06 — End: 2023-11-07
  Administered 2023-11-06 – 2023-11-07 (×2): 10 mg via ORAL
  Filled 2023-11-05 (×2): qty 1

## 2023-11-05 MED ORDER — CHLORHEXIDINE GLUCONATE CLOTH 2 % EX PADS: 6.0000 | MEDICATED_PAD | Freq: Once | CUTANEOUS | Status: DC

## 2023-11-05 MED ORDER — NITROGLYCERIN 0.4 MG SL SUBL: 0.4000 mg | SUBLINGUAL_TABLET | SUBLINGUAL | Status: DC | PRN

## 2023-11-05 MED ORDER — VITAMIN D (ERGOCALCIFEROL) 1.25 MG (50000 UNIT) PO CAPS: 50000.0000 [IU] | ORAL_CAPSULE | ORAL | Status: DC

## 2023-11-05 MED ORDER — FOLIC ACID 1 MG PO TABS
1.0000 mg | ORAL_TABLET | Freq: Every day | ORAL | Status: DC
Start: 2023-11-06 — End: 2023-11-07
  Administered 2023-11-06 – 2023-11-07 (×2): 1 mg via ORAL
  Filled 2023-11-05 (×2): qty 1

## 2023-11-05 MED ORDER — ENSURE PRE-SURGERY PO LIQD: 296.0000 mL | Freq: Once | ORAL | Status: DC

## 2023-11-05 MED ORDER — ACETAMINOPHEN 10 MG/ML IV SOLN
INTRAVENOUS | Status: AC
Start: 2023-11-05 — End: 2023-11-05
  Filled 2023-11-05: qty 100

## 2023-11-05 MED ORDER — CHLORHEXIDINE GLUCONATE 0.12 % MT SOLN
15.0000 mL | Freq: Once | OROMUCOSAL | Status: AC
  Administered 2023-11-05: 15 mL via OROMUCOSAL

## 2023-11-05 MED ORDER — ORAL CARE MOUTH RINSE: 15.0000 mL | Freq: Once | OROMUCOSAL | Status: AC

## 2023-11-05 MED ORDER — SIMETHICONE 80 MG PO CHEW
40.0000 mg | CHEWABLE_TABLET | Freq: Four times a day (QID) | ORAL | Status: DC | PRN
Start: 2023-11-05 — End: 2023-11-07

## 2023-11-05 MED ORDER — LIDOCAINE HCL (PF) 2 % IJ SOLN
INTRAMUSCULAR | Status: DC | PRN
  Administered 2023-11-05: 50 mg via INTRADERMAL

## 2023-11-05 MED ORDER — ALUM & MAG HYDROXIDE-SIMETH 200-200-20 MG/5ML PO SUSP
30.0000 mL | Freq: Four times a day (QID) | ORAL | Status: DC | PRN
Start: 2023-11-05 — End: 2023-11-07

## 2023-11-05 MED ORDER — SODIUM CHLORIDE 0.9 % IR SOLN
Status: DC | PRN
  Administered 2023-11-05: 1000 mL

## 2023-11-05 MED ORDER — ROSUVASTATIN CALCIUM 20 MG PO TABS
40.0000 mg | ORAL_TABLET | Freq: Every day | ORAL | Status: DC
  Administered 2023-11-06 – 2023-11-07 (×2): 40 mg via ORAL
  Filled 2023-11-05 (×2): qty 2

## 2023-11-05 MED ORDER — DIPHENHYDRAMINE HCL 12.5 MG/5ML PO ELIX: 12.5000 mg | ORAL_SOLUTION | Freq: Four times a day (QID) | ORAL | Status: DC | PRN

## 2023-11-05 MED ORDER — MIDAZOLAM HCL 5 MG/5ML IJ SOLN
INTRAMUSCULAR | Status: DC | PRN
  Administered 2023-11-05: 2 mg via INTRAVENOUS

## 2023-11-05 MED ORDER — OXYCODONE HCL 5 MG PO TABS
ORAL_TABLET | ORAL | Status: AC
Start: 2023-11-05 — End: 2023-11-05
  Filled 2023-11-05: qty 1

## 2023-11-05 MED ORDER — STERILE WATER FOR INJECTION IJ SOLN
INTRAMUSCULAR | Status: AC
  Filled 2023-11-05: qty 10

## 2023-11-05 MED ORDER — FENTANYL CITRATE (PF) 100 MCG/2ML IJ SOLN
INTRAMUSCULAR | Status: DC | PRN
  Administered 2023-11-05 (×2): 50 ug via INTRAVENOUS

## 2023-11-05 MED ORDER — SUGAMMADEX SODIUM 200 MG/2ML IV SOLN
INTRAVENOUS | Status: DC | PRN
Start: 2023-11-05 — End: 2023-11-05
  Administered 2023-11-05: 200 mg via INTRAVENOUS

## 2023-11-05 MED ORDER — IBUPROFEN 200 MG PO TABS: 600.0000 mg | ORAL_TABLET | Freq: Four times a day (QID) | ORAL | Status: DC | PRN

## 2023-11-05 MED ORDER — POLYETHYLENE GLYCOL 3350 17 GM/SCOOP PO POWD: 238.0000 g | Freq: Once | ORAL | Status: DC

## 2023-11-05 MED ORDER — ENSURE PRE-SURGERY PO LIQD: 592.0000 mL | Freq: Once | ORAL | Status: DC

## 2023-11-05 MED ORDER — ACETAMINOPHEN 10 MG/ML IV SOLN
1000.0000 mg | Freq: Once | INTRAVENOUS | Status: DC | PRN
Start: 2023-11-05 — End: 2023-11-05
  Administered 2023-11-05: 1000 mg via INTRAVENOUS

## 2023-11-05 MED ORDER — BUPIVACAINE-EPINEPHRINE (PF) 0.25% -1:200000 IJ SOLN
INTRAMUSCULAR | Status: AC
  Filled 2023-11-05: qty 60

## 2023-11-05 MED ORDER — OXYCODONE HCL 5 MG PO TABS
5.0000 mg | ORAL_TABLET | Freq: Once | ORAL | Status: AC | PRN
  Administered 2023-11-05: 5 mg via ORAL

## 2023-11-05 MED ORDER — DIPHENHYDRAMINE HCL 50 MG/ML IJ SOLN: 12.5000 mg | Freq: Four times a day (QID) | INTRAMUSCULAR | Status: DC | PRN

## 2023-11-05 MED ORDER — SODIUM CHLORIDE 0.9 % IV SOLN
2.0000 g | INTRAVENOUS | Status: AC
  Administered 2023-11-05: 2 g via INTRAVENOUS
  Filled 2023-11-05: qty 2

## 2023-11-05 MED ORDER — ENSURE SURGERY PO LIQD: 237.0000 mL | Freq: Two times a day (BID) | ORAL | Status: DC

## 2023-11-05 MED ORDER — ACETAMINOPHEN 500 MG PO TABS
1000.0000 mg | ORAL_TABLET | Freq: Four times a day (QID) | ORAL | Status: DC
  Administered 2023-11-06 – 2023-11-07 (×5): 1000 mg via ORAL
  Filled 2023-11-05 (×7): qty 2

## 2023-11-05 MED ORDER — HEPARIN SODIUM (PORCINE) 5000 UNIT/ML IJ SOLN
5000.0000 [IU] | Freq: Three times a day (TID) | INTRAMUSCULAR | Status: DC
  Administered 2023-11-05 – 2023-11-07 (×5): 5000 [IU] via SUBCUTANEOUS
  Filled 2023-11-05 (×5): qty 1

## 2023-11-05 MED ORDER — KETAMINE HCL 50 MG/5ML IJ SOSY
PREFILLED_SYRINGE | INTRAMUSCULAR | Status: AC
  Filled 2023-11-05: qty 5

## 2023-11-05 MED ORDER — DROPERIDOL 2.5 MG/ML IJ SOLN: 0.6250 mg | Freq: Once | INTRAMUSCULAR | Status: DC | PRN

## 2023-11-05 MED ORDER — ACETAMINOPHEN 500 MG PO TABS
ORAL_TABLET | ORAL | Status: AC
Start: 2023-11-05 — End: 2023-11-05
  Filled 2023-11-05: qty 2

## 2023-11-05 MED ORDER — ROCURONIUM BROMIDE 10 MG/ML (PF) SYRINGE
PREFILLED_SYRINGE | INTRAVENOUS | Status: AC
  Filled 2023-11-05: qty 10

## 2023-11-05 MED ORDER — HYDRALAZINE HCL 20 MG/ML IJ SOLN: 10.0000 mg | INTRAMUSCULAR | Status: DC | PRN

## 2023-11-05 MED ORDER — NEOMYCIN SULFATE 500 MG PO TABS: 1000.0000 mg | ORAL_TABLET | ORAL | Status: DC

## 2023-11-05 MED ORDER — ALBUTEROL SULFATE (2.5 MG/3ML) 0.083% IN NEBU: 2.5000 mg | INHALATION_SOLUTION | Freq: Four times a day (QID) | RESPIRATORY_TRACT | Status: DC | PRN

## 2023-11-05 MED ORDER — LOSARTAN POTASSIUM 50 MG PO TABS
50.0000 mg | ORAL_TABLET | Freq: Every day | ORAL | Status: DC
  Administered 2023-11-06 – 2023-11-07 (×2): 50 mg via ORAL
  Filled 2023-11-05 (×2): qty 1

## 2023-11-05 MED ORDER — METRONIDAZOLE 500 MG PO TABS: 1000.0000 mg | ORAL_TABLET | ORAL | Status: DC

## 2023-11-05 MED ORDER — HEPARIN SODIUM (PORCINE) 5000 UNIT/ML IJ SOLN
5000.0000 [IU] | Freq: Once | INTRAMUSCULAR | Status: AC
  Administered 2023-11-05: 5000 [IU] via SUBCUTANEOUS
  Filled 2023-11-05: qty 1

## 2023-11-05 MED ORDER — IOHEXOL 300 MG/ML  SOLN
INTRAMUSCULAR | Status: DC | PRN
  Administered 2023-11-05: 6 mL

## 2023-11-05 MED ORDER — STERILE WATER FOR IRRIGATION IR SOLN
Status: DC | PRN
Start: 2023-11-05 — End: 2023-11-05
  Administered 2023-11-05: 1000 mL

## 2023-11-05 MED ORDER — OXYCODONE HCL 5 MG/5ML PO SOLN: 5.0000 mg | Freq: Once | ORAL | Status: AC | PRN

## 2023-11-05 MED ORDER — ALVIMOPAN 12 MG PO CAPS
12.0000 mg | ORAL_CAPSULE | Freq: Two times a day (BID) | ORAL | Status: DC
  Administered 2023-11-06: 12 mg via ORAL
  Filled 2023-11-05 (×2): qty 1

## 2023-11-05 MED ORDER — TRAMADOL HCL 50 MG PO TABS
50.0000 mg | ORAL_TABLET | Freq: Four times a day (QID) | ORAL | Status: DC | PRN
  Administered 2023-11-06 – 2023-11-07 (×4): 50 mg via ORAL
  Filled 2023-11-05 (×4): qty 1

## 2023-11-05 MED ORDER — ONDANSETRON HCL 4 MG/2ML IJ SOLN: 4.0000 mg | Freq: Four times a day (QID) | INTRAMUSCULAR | Status: DC | PRN

## 2023-11-05 MED ORDER — PROPOFOL 10 MG/ML IV BOLUS
INTRAVENOUS | Status: DC | PRN
  Administered 2023-11-05: 200 mg via INTRAVENOUS

## 2023-11-05 MED ORDER — HYDROMORPHONE HCL 1 MG/ML IJ SOLN
0.2500 mg | INTRAMUSCULAR | Status: DC | PRN
  Administered 2023-11-05 (×4): 0.5 mg via INTRAVENOUS

## 2023-11-05 SURGICAL SUPPLY — 98 items
ADAPTER GOLDBERG URETERAL (ADAPTER) IMPLANT
BAG COUNTER SPONGE SURGICOUNT (BAG) IMPLANT
BAG URO CATCHER STRL LF (MISCELLANEOUS) ×2 IMPLANT
BLADE EXTENDED COATED 6.5IN (ELECTRODE) ×2 IMPLANT
CANNULA REDUCER 12-8 DVNC XI (CANNULA) ×2 IMPLANT
CATH URETL OPEN 5X70 (CATHETERS) IMPLANT
CELLS DAT CNTRL 66122 CELL SVR (MISCELLANEOUS) IMPLANT
CHLORAPREP W/TINT 26 (MISCELLANEOUS) ×2 IMPLANT
CLIP APPLIE 5 13 M/L LIGAMAX5 (MISCELLANEOUS) IMPLANT
CLIP APPLIE ROT 10 11.4 M/L (STAPLE) IMPLANT
CLIP LIGATING HEM O LOK PURPLE (MISCELLANEOUS) IMPLANT
CLIP LIGATING HEMO O LOK GREEN (MISCELLANEOUS) IMPLANT
CLOTH BEACON ORANGE TIMEOUT ST (SAFETY) ×2 IMPLANT
COVER SURGICAL LIGHT HANDLE (MISCELLANEOUS) ×4 IMPLANT
COVER TIP SHEARS 8 DVNC (MISCELLANEOUS) ×2 IMPLANT
DEFOGGER SCOPE WARM SEASHARP (MISCELLANEOUS) ×4 IMPLANT
DERMABOND ADVANCED .7 DNX12 (GAUZE/BANDAGES/DRESSINGS) IMPLANT
DEVICE TROCAR PUNCTURE CLOSURE (ENDOMECHANICALS) IMPLANT
DRAIN CHANNEL 19F RND (DRAIN) ×2 IMPLANT
DRAPE ARM DVNC X/XI (DISPOSABLE) ×8 IMPLANT
DRAPE COLUMN DVNC XI (DISPOSABLE) ×2 IMPLANT
DRAPE CV SPLIT W-CLR ANES SCRN (DRAPES) ×2 IMPLANT
DRAPE PERI GROIN 82X75IN TIB (DRAPES) ×2 IMPLANT
DRAPE SURG IRRIG POUCH 19X23 (DRAPES) ×2 IMPLANT
DRIVER NDL LRG 8 DVNC XI (INSTRUMENTS) ×2 IMPLANT
DRIVER NDLE LRG 8 DVNC XI (INSTRUMENTS) IMPLANT
DRSG OPSITE POSTOP 4X10 (GAUZE/BANDAGES/DRESSINGS) IMPLANT
DRSG OPSITE POSTOP 4X6 (GAUZE/BANDAGES/DRESSINGS) IMPLANT
DRSG OPSITE POSTOP 4X8 (GAUZE/BANDAGES/DRESSINGS) IMPLANT
DRSG TEGADERM 2-3/8X2-3/4 SM (GAUZE/BANDAGES/DRESSINGS) ×10 IMPLANT
DRSG TEGADERM 4X4.75 (GAUZE/BANDAGES/DRESSINGS) ×2 IMPLANT
ELECT REM PT RETURN 15FT ADLT (MISCELLANEOUS) ×2 IMPLANT
ENDOLOOP SUT PDS II 0 18 (SUTURE) IMPLANT
EVACUATOR SILICONE 100CC (DRAIN) ×2 IMPLANT
GAUZE SPONGE 2X2 8PLY STRL LF (GAUZE/BANDAGES/DRESSINGS) ×2 IMPLANT
GAUZE SPONGE 4X4 12PLY STRL (GAUZE/BANDAGES/DRESSINGS) IMPLANT
GLOVE BIO SURGEON STRL SZ7.5 (GLOVE) ×6 IMPLANT
GLOVE BIO SURGEON STRL SZ8 (GLOVE) ×2 IMPLANT
GLOVE INDICATOR 8.0 STRL GRN (GLOVE) ×6 IMPLANT
GOWN SRG XL LVL 4 BRTHBL STRL (GOWNS) ×2 IMPLANT
GOWN STRL REUS W/ TWL XL LVL3 (GOWN DISPOSABLE) ×12 IMPLANT
GRASPER SUT TROCAR 14GX15 (MISCELLANEOUS) IMPLANT
GRASPER TIP-UP FEN DVNC XI (INSTRUMENTS) ×2 IMPLANT
GUIDEWIRE ANG ZIPWIRE 038X150 (WIRE) IMPLANT
GUIDEWIRE STR DUAL SENSOR (WIRE) IMPLANT
HOLDER FOLEY CATH W/STRAP (MISCELLANEOUS) ×2 IMPLANT
IRRIGATION SUCT STRKRFLW 2 WTP (MISCELLANEOUS) ×2 IMPLANT
KIT IMAGING PINPOINTPAQ (MISCELLANEOUS) IMPLANT
KIT PROCEDURE DVNC SI (MISCELLANEOUS) ×2 IMPLANT
KIT TURNOVER KIT A (KITS) ×4 IMPLANT
MANIFOLD NEPTUNE II (INSTRUMENTS) ×2 IMPLANT
NDL INSUFFLATION 14GA 120MM (NEEDLE) ×2 IMPLANT
NEEDLE INSUFFLATION 14GA 120MM (NEEDLE) ×2 IMPLANT
PACK COLON (CUSTOM PROCEDURE TRAY) ×2 IMPLANT
PACK CYSTO (CUSTOM PROCEDURE TRAY) ×2 IMPLANT
PAD POSITIONING PINK XL (MISCELLANEOUS) ×2 IMPLANT
PENCIL SMOKE EVACUATOR (MISCELLANEOUS) IMPLANT
PROTECTOR NERVE ULNAR (MISCELLANEOUS) ×4 IMPLANT
RELOAD STAPLE 45 3.5 BLU DVNC (STAPLE) IMPLANT
RELOAD STAPLE 45 4.3 GRN DVNC (STAPLE) IMPLANT
RELOAD STAPLE 60 3.5 BLU DVNC (STAPLE) IMPLANT
RELOAD STAPLE 60 4.3 GRN DVNC (STAPLE) IMPLANT
RETRACTOR WND ALEXIS 18 MED (MISCELLANEOUS) IMPLANT
SCISSORS LAP 5X35 DISP (ENDOMECHANICALS) IMPLANT
SCISSORS MNPLR CVD DVNC XI (INSTRUMENTS) ×2 IMPLANT
SEAL UNIV 5-12 XI (MISCELLANEOUS) ×8 IMPLANT
SEALER VESSEL EXT DVNC XI (MISCELLANEOUS) ×2 IMPLANT
SLEEVE ADV FIXATION 5X100MM (TROCAR) IMPLANT
SOLUTION ELECTROSURG ANTI STCK (MISCELLANEOUS) ×2 IMPLANT
SPIKE FLUID TRANSFER (MISCELLANEOUS) ×2 IMPLANT
STAPLER 60 SUREFORM DVNC (STAPLE) IMPLANT
STAPLER ECHELON POWER CIR 29 (STAPLE) IMPLANT
STAPLER ECHELON POWER CIR 31 (STAPLE) IMPLANT
STOPCOCK 4 WAY LG BORE MALE ST (IV SETS) ×4 IMPLANT
SURGILUBE 2OZ TUBE FLIPTOP (MISCELLANEOUS) ×2 IMPLANT
SUT MNCRL AB 4-0 PS2 18 (SUTURE) ×2 IMPLANT
SUT PDS AB 1 CT1 27 (SUTURE) IMPLANT
SUT PDS AB 1 TP1 96 (SUTURE) IMPLANT
SUT PROLENE 0 CT 2 (SUTURE) IMPLANT
SUT PROLENE 2 0 KS (SUTURE) ×2 IMPLANT
SUT PROLENE 2 0 SH DA (SUTURE) IMPLANT
SUT SILK 2 0 SH CR/8 (SUTURE) IMPLANT
SUT SILK 2-0 18XBRD TIE 12 (SUTURE) IMPLANT
SUT SILK 3 0 SH CR/8 (SUTURE) ×2 IMPLANT
SUT SILK 3-0 18XBRD TIE 12 (SUTURE) ×2 IMPLANT
SUT VIC AB 3-0 SH 18 (SUTURE) IMPLANT
SUT VIC AB 3-0 SH 27XBRD (SUTURE) IMPLANT
SUT VICRYL 0 UR6 27IN ABS (SUTURE) ×2 IMPLANT
SUTURE V-LC BRB 180 2/0GR6GS22 (SUTURE) IMPLANT
SYR 10ML LL (SYRINGE) ×2 IMPLANT
SYSTEM LAPSCP GELPORT 120MM (MISCELLANEOUS) IMPLANT
SYSTEM WOUND ALEXIS 18CM MED (MISCELLANEOUS) ×2 IMPLANT
TAPE UMBILICAL 1/8 X36 TWILL (MISCELLANEOUS) ×2 IMPLANT
TRAY FOLEY MTR SLVR 16FR STAT (SET/KITS/TRAYS/PACK) ×2 IMPLANT
TROCAR ADV FIXATION 5X100MM (TROCAR) ×2 IMPLANT
TUBING CONNECTING 10 (TUBING) ×8 IMPLANT
TUBING INSUFFLATION 10FT LAP (TUBING) ×2 IMPLANT
TUBING UROLOGY SET (TUBING) IMPLANT

## 2023-11-05 NOTE — H&P (Addendum)
 CC: Here today for surgery  HPI: Billy Cummings is an 53 y.o. male with history of HTN, HLD, CAD with stents (on plavix ), whom is seen in the office today as a referral by Dr. Charlanne for evaluation of sigmoid diverticulitis.   CT A/P 12/23/22 1. Sigmoid colonic diverticulosis with 3.6 cm pericolonic collection and adjacent stranding. This likely represents acute diverticulitis with abscess. Follow-up after resolution of the acute process is recommended to exclude underlying neoplasm. 2. Diffuse fatty infiltration of the liver. 3. Mild thickening of bladder wall may indicate reactive inflammation or cystitis. Correlate with urinalysis. 4. Left solid pulmonary nodule measuring 5 mm. Per Fleischner Society Guidelines, no routine follow-up imaging is recommended. These guidelines do not apply to immunocompromised patients and patients with cancer. Follow up in patients with significant comorbidities as clinically warranted. For lung cancer screening, adhere to Lung-RADS guidelines. Reference: Radiology. 2017; 284(1):228-43. 5. Aortic atherosclerosis.   CT A/P 12/25/2022 1. Little interval change in the appearance of acute sigmoid diverticulitis with pericolonic abscess measuring approximately 3.7 x 2.8 x 3.6 cm. No extraluminal air. No new collections. 2. Reactive wall thickening at the bladder dome adjacent to the sigmoid colon inflammation. No air within the bladder to suggest a colovesical fistula. 3. Hepatomegaly and hepatic steatosis. 4. Mosaic attenuation within the bilateral lung bases, similar to prior. Findings are nonspecific but can be seen in the setting of small airways disease. 5. Aortic atherosclerosis (ICD10-I70.0).   IR Drainage 12/26/22    CT Pelvis 01/01/23 Stable position of percutaneous drainage catheter between urinary bladder and inflamed sigmoid colon. No significant residual fluid collection is noted.   CT A/P 05/18/23 1. Diverticulosis with sigmoid wall  thickening, likely related to muscular hypertrophy. No convincing evidence of residual or recurrent diverticulitis. Mild interstitial thickening anterior to the sigmoid, the site of an abscess on 12/25/2022, likely represents residual scarring. No recurrent drainable fluid collection. 2. The adjacent bladder inflammation has resolved. 3. Age advanced coronary artery atherosclerosis. Recommend assessment of coronary risk factors. 4. Aortic Atherosclerosis (ICD10-I70.0). This is also age advanced. 5. Hepatic steatosis and hepatomegaly. 6.  Emphysema (ICD10-J43.9).     Colonoscopy 07/20/23 - 4 polyps removed - Moderate to severe sigmoid diverticulosis - Nonbleeding internal hemorrhoids   He has been doing well and has recovered from his most recent attack.  He does report he first began experiencing diverticular attacks back in 2017 he has had approximately 3.  The most recent of which was what required a percutaneous drain.  Currently denies any abdominal pain.  He feels well.  He is here today with his girlfriend.  He denies any changes in health or health history since we met in the office. No new medications/allergies. He states he is ready for surgery today. Tolerated bowel prep with satisfactory result including oral antibiotics  Past Medical History:  Diagnosis Date   Anxiety    Arthritis    Cigarette smoker 08/29/2015   COPD (chronic obstructive pulmonary disease) (HCC)    Coronary artery disease involving native coronary artery of native heart without angina pectoris 08/29/2015   Overview:  STEMI -late restenosis ,inferior wall--stenting of RCA x 2 08/15/15 and old PCI and stent of RCA in 2012   Depression    Diverticulitis    Dyslipidemia 08/29/2015   Essential hypertension 02/23/2020   History of myocardial infarction    History of TIA (transient ischemic attack) 2021   Hypertriglyceridemia 05/11/2017   Leukocytosis 07/17/2020   Myocardial infarction (HCC)  OSA  (obstructive sleep apnea) 06/28/2020   Severe, AHI 40/hour-210 pounds    no cpap   Pre-diabetes    Restless leg syndrome     Past Surgical History:  Procedure Laterality Date   CARDIAC CATHETERIZATION     At [facility] regional s/p stent placment x2 followed by dr monetta   COLONOSCOPY  01/23/2014   Colonic polyps status post polypectomy. Moderate to severe sigmoid diverticulosis with evidence of recent diverticulitis and evidence of recurrent previous diverticulitis   CORONARY ANGIOPLASTY WITH STENT PLACEMENT Left 08/15/2015   CORONARY PRESSURE/FFR STUDY N/A 12/26/2021   Procedure: INTRAVASCULAR PRESSURE WIRE/FFR STUDY;  Surgeon: Verlin Lonni BIRCH, MD;  Location: [facility] INVASIVE CV LAB;  Service: Cardiovascular;  Laterality: N/A;   CORONARY STENT INTERVENTION N/A 01/23/2021   Procedure: CORONARY STENT INTERVENTION;  Surgeon: Wonda Sharper, MD;  Location: [facility] INVASIVE CV LAB;  Service: Cardiovascular;  Laterality: N/A;   LEFT HEART CATH AND CORONARY ANGIOGRAPHY N/A 01/23/2021   Procedure: LEFT HEART CATH AND CORONARY ANGIOGRAPHY;  Surgeon: Wonda Sharper, MD;  Location: [facility] INVASIVE CV LAB;  Service: Cardiovascular;  Laterality: N/A;   RIGHT/LEFT HEART CATH AND CORONARY ANGIOGRAPHY N/A 12/26/2021   Procedure: RIGHT/LEFT HEART CATH AND CORONARY ANGIOGRAPHY;  Surgeon: Verlin Lonni BIRCH, MD;  Location: [facility] INVASIVE CV LAB;  Service: Cardiovascular;  Laterality: N/A;    Family History  Problem Relation Age of Onset   Heart attack Mother    Heart disease Mother    Breast cancer Mother    Heart attack Father    Heart disease Father    Heart attack Brother    Heart disease Brother    Bladder Cancer Brother    Colon cancer Neg Hx    Stomach cancer Neg Hx    Pancreatic cancer Neg Hx    Esophageal cancer Neg Hx     Social:  reports that he has been smoking cigarettes. He has a 57 pack-year smoking history. He has never used smokeless tobacco. He reports that he does not drink alcohol and  does not use drugs.  Allergies: No Known Allergies  Medications: I have reviewed the patient's current medications.  No results found for this or any previous visit (from the past 48 hours).  No results found.   PE Blood pressure 129/87, pulse 68, temperature 97.7 F (36.5 C), temperature source Oral, resp. rate 16, height 6' 2 (1.88 m), weight 103 kg, SpO2 94%. Constitutional: NAD; conversant Eyes: Moist conjunctiva; no lid lag; anicteric Lungs: Normal respiratory effort CV: RRR GI: Abd soft, NT/ND; no palpable hepatosplenomegaly Psychiatric: Appropriate affect  No results found for this or any previous visit (from the past 48 hours).  No results found.  A/P: Broghan Pannone is an 53 y.o. male with hx of  HTN, HLD, CAD with stents (on plavix ), here for evaluation of recurrent diverticulitis with known hx of pericolic abscess requiring perc drainage   Cscope clear 06/2023   -Cardiac clearance, Dr. Kardie Tobb - plavix  off since 10/25/23 -I had strongly recommended he quit smoking and discussed rationale therein.  We spent time additionally reviewing the added risks of tobacco use including a high risk for heart attacks, stroke, pulmonary complications/pneumonia, infectious complications, anastomotic complications, need for ostomies, hernias, among others.  He expressed clear understanding of all this.  He reports he plans to quit smoking this week.   - The anatomy and physiology of the GI tract was reviewed with the patient. The pathophysiology of diverticulitis was discussed as well with associated  pictures. -We have discussed various different treatment options going forward including surgery (the most definitive) to address this -robotic assisted low anterior resection; flexible sigmoidoscopy -The planned procedure, material risks (including, but not limited to, pain, bleeding, infection, scarring, need for blood transfusion, damage to surrounding structures- blood  vessels/nerves/viscus/organs, damage to ureter, urine leak, leak from anastomosis, need for additional procedures, sexual dysfunction, low anterior resection syndrome (LARS) = increased fecal urgency and/or frequency, scenarios where a stoma may be necessary and where it may be permanent, worsening of pre-existing medical conditions, chronic diarrhea, constipation secondary to narcotic use, hernia, recurrence, pneumonia, heart attack, stroke, death) benefits and alternatives to surgery were discussed at length. The patient's questions were answered to his satisfaction, he voiced understanding and elected to proceed with surgery. Additionally, we discussed typical postoperative expectations and the recovery process.   Lonni Pizza, MD [facility], [facility]

## 2023-11-05 NOTE — Anesthesia Procedure Notes (Signed)
 Procedure Name: Intubation Date/Time: 11/05/2023 8:05 AM  Performed by: Gladis Honey, CRNAPre-anesthesia Checklist: Patient identified, Emergency Drugs available, Suction available, Patient being monitored and Timeout performed Patient Re-evaluated:Patient Re-evaluated prior to induction Oxygen Delivery Method: Circle System Utilized Preoxygenation: Pre-oxygenation with 100% oxygen Induction Type: IV induction Ventilation: Mask ventilation without difficulty Laryngoscope Size: Miller and 2 Grade View: Grade I Tube type: Oral Number of attempts: 1 Airway Equipment and Method: Stylet and Oral airway Placement Confirmation: ETT inserted through vocal cords under direct vision, positive ETCO2 and breath sounds checked- equal and bilateral Secured at: 23 cm Tube secured with: Tape Dental Injury: Teeth and Oropharynx as per pre-operative assessment

## 2023-11-05 NOTE — Transfer of Care (Signed)
 Immediate Anesthesia Transfer of Care Note  Patient: Billy Cummings  Procedure(s) Performed: CYSTOSCOPY WITH INDOCYANINE GREEN IMAGING (ICG) RESECTION, RECTUM, LOW ANTERIOR, ROBOT-ASSISTED WITH BILATERAL TAP BLOCK, INTRAOPERATIVE ASSESSMENT OF PERFUSION USING FIREFLY, (Abdomen) SIGMOIDOSCOPY, FLEXIBLE  Patient Location: PACU  Anesthesia Type:General  Level of Consciousness: sedated  Airway & Oxygen Therapy: Patient Spontanous Breathing and Patient connected to face mask oxygen  Post-op Assessment: Report given to RN and Post -op Vital signs reviewed and stable  Post vital signs: Reviewed and stable  Last Vitals:  Vitals Value Taken Time  BP 151/97 11/05/23 10:19  Temp    Pulse 86 11/05/23 10:24  Resp 20 11/05/23 10:24  SpO2 99 % 11/05/23 10:24  Vitals shown include unfiled device data.  Last Pain:  Vitals:   11/05/23 0606  TempSrc: Oral  PainSc: 0-No pain         Complications: No notable events documented.

## 2023-11-05 NOTE — Op Note (Signed)
 Operative Note  Preoperative diagnosis:  1.  Diverticulitis of the sigmoid   Postoperative diagnosis: 1.  Diverticulitis of the sigmoid   Procedure(s): 1.  Cystoscopy 2. Firefly instillation in bilateral ureters 3.  Bilateral retrograde pyelogram   Surgeon: Steffan Pea MD  Assistants:  None  Anesthesia:  General  Complications:  None  EBL:  None for my portion of the case  Specimens: 1. None for my portion of the case  Drains/Catheters: 1.  16 fr catheter   Intraoperative findings:   Bladder without suspicious bladder lesions.  Retrograde pyelogram interpretation: Bilateral retrograde pyelograms demonstrating that firefly reach the renal collecting system and fill the entirety of the ureter.  No filling defects noted.   Indication:  Sigmoid diverticulitis   Description of procedure: The patient was identified and surgical site verification was performed prior to obtaining consent.  The patient was brought to the operating suite.  Under adequate general anesthesia the patient was positioned in dorsal lithotomy and prepped and draped.  A preoperative Time Out was performed addressing the anticipated surgical site, procedure, and safety precautions.  The 21Fr cystoscope was inserted into the patient's urethral meatus and advanced to the bladder.   The bladder was inspected with the 30 degree lens.  The ureteral orifices were in their normal anatomic positions.  The bladder showed no trabeculation.  There were no bladder tumors noted.  The right orifice was intubated with a sensor wire and a 5 [place] open ended catheter was placed over the wire into the right ureter and advanced to 25 cm.  The firefly was mixed with the contrast I then slowly pulled back and injected 7.5ml of the firefly contrast.  Retrograde pyelogram was taken demonstrating that the firefly had reached the renal pelvis.  I subsequently turned my attention to the patient's left ureteral orifice and  performed a similar task, injecting 7.39ml of the firefly solution. in a retrograde fashion in the left ureter.   I then  placed a 16 [place] Foley.  The case was then turned over to Dr. Joleen team for the remainder of the procedure.  [facility]

## 2023-11-05 NOTE — Op Note (Signed)
 PATIENT: Billy Cummings  53 y.o. male  Patient Care Team: King, Jacqueline E, FNP as PCP - General (Family Medicine) Tobb, Kardie, DO as PCP - Cardiology (Cardiology)  PREOP DIAGNOSIS: DIVERTICULITIS  POSTOP DIAGNOSIS: DIVERTICULITIS  PROCEDURE:  Robotic assisted low anterior resection with double stapled colorectal anastomosis Intraoperative assessment of perfusion using ICG fluorescence imaging Flexible sigmoidoscopy Bilateral transversus abdominus plane (TAP) blocks  SURGEON: Lonni HERO. Cressie Betzler, MD  ASSISTANT: Bernarda Ned, MD  An experienced assistant was required given the complexity of this procedure and the standard of surgical care. My assistant helped with exposure through counter tension, suctioning, ligation and retraction to better visualize the surgical field. My assistant expedited sewing during the case by following my sutures. Wherever I use the term we in the report, my assistant actively helped me with that portion of the procedure.   ANESTHESIA: General endotracheal  EBL: 30 mL Total I/O In: 100 [IV Piggyback:100] Out: -   DRAINS: none  SPECIMEN: Rectosigmoid colon open and proximal  COUNTS: Sponge, needle and instrument counts were reported correct x2  FINDINGS: Mid and distal sigmoid colon with significant fibrosis and thickening consistent with history of diverticulitis.  It is densely adherent in 1 focal area to the back wall of the bladder.  We are able to take this down without getting into the bladder.  An abscess cavity was encountered at this location.  Bladder was backfilled with methylene blue  and demonstrates under tense feeling of the bladder no extravasation or evident colovesical fistula.  Fibrosis extends onto the proximal most aspect of the rectum necessitating going to resection to obtain healthy supple rectal tissue for an anastomosis.  A well perfused, tension free, hemostatic, air tight 29 mm EEA colorectal anastomosis fashioned 14 cm from  the anal verge by flexible sigmoidoscopy.   NARRATIVE: Informed consent was verified. The patient was taken to the operating room, placed supine on the operating table and SCD's were applied. General endotracheal anesthesia was induced without difficulty.  He was then positioned in the lithotomy position with Allen stirrups.  Pressure points were evaluated and padded.  A foley catheter was then placed by nursing under sterile conditions. Hair on the abdomen was clipped.  He was secured to the operating table.  Dr. Shane with [facility] scrubbed for his portion of the procedure.  Please refer to his note for details. The abdomen was then prepped and draped in the standard sterile fashion. Surgical timeout was called indicating the correct patient, procedure, positioning and need for preoperative antibiotics.   An OG tube was placed by anesthesia and confirmed to be to suction.  At Palmer's point, a stab incision was created and the Veress needle was introduced into the peritoneal cavity on the first attempt.  Intraperitoneal location was confirmed by the aspiration and saline drop test.  Pneumoperitoneum was established to a maximum pressure of 15 mmHg using CO2.  Following this, the abdomen was marked for planned trocar sites.  Just to the right and cephalad to the umbilicus, an 8 mm incision was created and an 8 mm blunt tipped robotic trocar was cautiously placed into the peritoneal cavity.  The laparoscope was inserted and demonstrated no evidence of trocar site nor Veress needle site complications.  The Veress needle was removed.  Bilateral transversus abdominis plane blocks were then created using a dilute mixture of Exparel  with Marcaine .  3 additional 8 mm robotic trochars were placed under direct visualization roughly in a line extending from the right ASIS towards  the left upper quadrant. The bladder was inspected and noted to be at/below the pubic symphysis.  Staying 3 fingerbreadths above  the pubic symphysis, an incision was created and the 12 mm robotic trocar inserted directed cephalad into the peritoneal cavity under direct visualization.  An additional 5 mm assist port was placed in the right lateral abdomen under direct visualization.  The abdomen was surveyed and there was sigmoidal adhesions in the pelvis but otherwise no intra-abdominal findings.  He was positioned in Trendelenburg with the left side tilted slightly up.  Small bowel was carefully retracted out of the pelvis.  The robot was then docked and I went to the console.   The sigmoid colon was readily identified.  Adhesions of the mid and distal sigmoid colon were taken down.  There are filmy type adhesions in the pelvis that were able to break up without difficulty.  In doing so, we mobilized much of the sigmoid colon and proximal rectum.  There is an evident area that is focal but on the dome of the bladder and densely adherent.  This was then carefully taken down sharply.  In doing so, we did encounter a small abscess cavity which drained approximately 2 cc of purulent fluid.  CASE DATA:  Type of patient?: Elective [facility] Private Case  Status of Case? Elective Scheduled  Infection Present At Time Of Surgery (PATOS)?  PURULENCE   Attachments of the sigmoid colon were then taken down from the intersigmoid fossa.  The rectosigmoid colon was grasped and elevated anteriorly.  Beginning with a medial to lateral approach, the peritoneum overlying the presacral space was carefully incised.  The TME plane was readily gained working in a plane between the fascia propria of the rectum and the presacral fascia.  Hypogastric nerves were seen going along the the presacral fascia and were protected free of injury.  Working more proximally, the mesorectum and sigmoid mesentery were carefully mobilized off of the peritoneum.  The left ureter was identified and protected free of injury.  The left gonadal vessels were identified and  protected.  These were both swept down.  The superior hemorrhoidal and IMA pedicles were identified. Further mesocolon was mobilized proximally staying in this plane between the retroperitoneum proper and the mesocolon. Attention was then turned to the lateral portion of dissection.  The sigmoid colon was then retracted to the right.  The sigmoid colon was fully mobilized. The descending colon was mobilized by incising the Troy Kanouse line of Toldt.  This was done all the way up to the level of the splenic flexure.  The associated mesocolon was also mobilized medially.  The left ureter again was confirmed to be well away from the vasculature which had been dissected medially.  The rectosigmoid colon was elevated anteriorly. The left ureter was re-identified. The IMA was clear of this. The IMA was then divided with the vessel sealer. The stump was inspected and noted to be completely hemostatic with a good seal.  The mesentery was divided out to the point of planned proximal division.  Working more distally, the rectum was identified where the tinea had splayed and there were loss of appendices epiploica.  The proximal most aspect of the rectum was also somewhat fibrotic from where the disease colon had laid up against it.  Therefore we opted to go just below this to healthy supple rectum.  This also corresponded to a location overlying the sacral promontory.  Anatomically, this clearly represents the proximal rectum.  The mesentery out  to this level was then cleared using the vessel sealer. The distal point of transection on the proximal rectum was identified.   A 60 mm green load robotic stapler was then placed through the 12 mm port and introduced into the peritoneal cavity.  The rectum was divided with essentially a single firing of the stapler.  There was 1 small corner and it was unclear if this actually included any of the rectum or if it was primarily remnant mesentery but this was also taken with the robotic  stapler using a green load.  The stump was intact and healthy in appearance.   Attention was turned to performing a perfusion test. ICG was administered by anesthesia and at the level of the cleared mesentery proximally, there was excellent uptake of the tracer.  The rectum was also well perfused in appearance.  There was a visible pulse in the mesentery out to the level of the cleared colon at the level of the proximal sigmoid/descending colon junction.  This colon is also supple and healthy in appearance without any thickening.  This reached into the pelvis without any difficulty and remained in that location without any tension. A locking grasper was then placed on the sigmoid staple line.   The bladder was then backfilled with 10 cc of methylene blue  diluted in 1 L of sterile saline.  The bladder was distended until it was fairly tense in appearance and this was approximately 400 cc of fluid.  With the bladder well-distended, the entire dome was inspected and found to be free of any evident injury or patent fistula.  Pressure was also applied to the back of the bladder and no extravasation of fluid is noted.  The Foley was placed back to gravity drainage and the bladder decompressed.  Attention was then turned to the extracorporeal portion of the procedure.  The robot was undocked.  I scrubbed back in.  Using the 12 mm trocar site, a Pfannenstiel incision was created and incorporated the fascial opening through the 12 mm port site.  The rectus fascia was incised and then elevated.  The rectus muscle was mobilized free of the overlying fascia.  The peritoneum was incised in the midline well above the location of the bladder.  An Alexis wound protector was placed.  Towels were placed around the field.  The divided colon was passed through the wound protector.  The point of proximal division was identified and was again on a healthy segment of supple colon with a palpable pulse in the mesentery. This was pink  in color.  A pursestring device was applied.  A 2-0 Prolene on a Keith needle was passed.  The colon was divided and passed off with the open end being proximal.  EEA sizers were then introduced and a 29 mm EEA selected.  Belt loops consisting of 3-0 silk were placed around the pursestring suture line.  The anvil was placed and the pursestring tied.  A small amount of fat was cleared from the planned anastomosis and no diverticula were apparent within this.  This was placed back into the abdomen and a cap placed over the wound protector port site.  Pneumoperitoneum was reestablished.  I then went below to pass the stapler.  My partner remained above.  EEA sizers were cautiously introduced via the anus and advanced under direct visualization.  The stapler was passed and the spike deployed just anterior to the staple line.  The components were then mated.  Orientation was confirmed such  that there is no twisting of the colon nor small bowel underneath the mesenteric defect. Care was taken to ensure no other structures were incorporated within this either.  The stapler was then closed, held, and fired. This was then removed. The donuts were inspected and noted to be complete.  The colon proximal to the anastomosis was then gently occluded. The pelvis was filled with sterile irrigation. Under direct visualization, I passed a flexible sigmoidoscope.  The anastomosis was under water .  With good distention of the anastomosis there was no air leak. The anastomosis pink in appearance.  This is located at 14 cm from the anal verge by flexible sigmoidoscopy.  It is hemostatic.  Additionally, looking from above, there is no tension on the colon or mesentery.  Sigmoidoscope was withdrawn.  Irrigation was evacuated from the pelvis.  The abdomen and pelvis are surveyed and noted to be completely hemostatic without any apparent injury.  Under direct visualization, all trochars are removed.  The Alexis wound protector was removed.   Gowns/gloves are changed and a fresh set of clean instruments utilized. Additional sterile drapes were placed around the field.   The Pfannenstiel peritoneum was closed with a running 2-0 Vicryl suture.  The rectus fascia was then closed using 2 running #1 PDS sutures.  The fascia was then palpated and noted to be completely closed.  Additional anesthetic was infiltrated at the Pfannenstiel site.  Sponge, needle, and instrument counts were reported correct x2. 4-0 Monocryl subcuticular suture was used to close the skin of all incision sites.  Dermabond was placed over all incisions.  A honeycomb dressing placed over the Pfannenstiel as well.   He was then taken out of lithotomy, awakened from anesthesia, extubated, and transferred to a stretcher for transport to PACU in satisfactory condition having tolerated the procedure well.

## 2023-11-05 NOTE — Anesthesia Postprocedure Evaluation (Addendum)
 Anesthesia Post Note  Patient: Billy Cummings  Procedure(s) Performed: CYSTOSCOPY WITH INDOCYANINE GREEN IMAGING (ICG) RESECTION, RECTUM, LOW ANTERIOR, ROBOT-ASSISTED WITH BILATERAL TAP BLOCK, INTRAOPERATIVE ASSESSMENT OF PERFUSION USING FIREFLY, (Abdomen) SIGMOIDOSCOPY, FLEXIBLE     Patient location during evaluation: PACU Anesthesia Type: General Level of consciousness: awake and alert Pain management: pain level controlled Vital Signs Assessment: post-procedure vital signs reviewed and stable Respiratory status: spontaneous breathing, nonlabored ventilation, respiratory function stable and patient connected to nasal cannula oxygen Cardiovascular status: blood pressure returned to baseline and stable Postop Assessment: no apparent nausea or vomiting Anesthetic complications: no   No notable events documented.  Last Vitals:  Vitals:   11/05/23 1145 11/05/23 1200  BP: (!) 141/85 124/88  Pulse: 77 92  Resp: 18 12  Temp:    SpO2: 98% 97%                 Billy Cummings

## 2023-11-06 ENCOUNTER — Encounter (HOSPITAL_COMMUNITY): Payer: Self-pay | Admitting: Urology

## 2023-11-06 LAB — CBC
HCT: 47.2 % (ref 39.0–52.0)
Hemoglobin: 15.5 g/dL (ref 13.0–17.0)
MCH: 29 pg (ref 26.0–34.0)
MCHC: 32.8 g/dL (ref 30.0–36.0)
MCV: 88.2 fL (ref 80.0–100.0)
Platelets: 291 K/uL (ref 150–400)
RBC: 5.35 MIL/uL (ref 4.22–5.81)
RDW: 14.4 % (ref 11.5–15.5)
WBC: 18 K/uL — ABNORMAL HIGH (ref 4.0–10.5)
nRBC: 0 % (ref 0.0–0.2)

## 2023-11-06 LAB — BASIC METABOLIC PANEL WITH GFR
Anion gap: 11 (ref 5–15)
BUN: 9 mg/dL (ref 6–20)
CO2: 22 mmol/L (ref 22–32)
Calcium: 8.6 mg/dL — ABNORMAL LOW (ref 8.9–10.3)
Chloride: 103 mmol/L (ref 98–111)
Creatinine, Ser: 0.69 mg/dL (ref 0.61–1.24)
GFR, Estimated: >60 mL/min (ref >60)
Glucose, Bld: 92 mg/dL (ref 70–99)
Potassium: 4 mmol/L (ref 3.5–5.1)
Sodium: 136 mmol/L (ref 135–145)

## 2023-11-06 MED ORDER — ASPIRIN 81 MG PO TBEC
81.0000 mg | DELAYED_RELEASE_TABLET | Freq: Every day | ORAL | Status: DC
  Administered 2023-11-06 – 2023-11-07 (×2): 81 mg via ORAL
  Filled 2023-11-06 (×2): qty 1

## 2023-11-06 MED ORDER — TRAMADOL HCL 50 MG PO TABS: 50.0000 mg | ORAL_TABLET | Freq: Four times a day (QID) | ORAL | 0 refills | Status: AC | PRN

## 2023-11-06 NOTE — Discharge Instructions (Signed)

## 2023-11-06 NOTE — Plan of Care (Signed)
   Problem: Coping: Goal: Level of anxiety will decrease Outcome: Progressing

## 2023-11-06 NOTE — Plan of Care (Signed)
  Problem: Coping: Goal: Level of anxiety will decrease Outcome: Progressing   Problem: Skin Integrity: Goal: Will show signs of wound healing Outcome: Progressing

## 2023-11-06 NOTE — Progress Notes (Signed)
   11/06/23 0938  TOC Brief Assessment  Insurance and Status Reviewed  Patient has primary care physician Yes  Home environment has been reviewed resides in private residence  Prior level of function: Independent  Prior/Current Home Services No current home services  Social Drivers of Health Review SDOH reviewed no interventions necessary  Readmission risk has been reviewed Yes  Transition of care needs no transition of care needs at this time

## 2023-11-06 NOTE — Progress Notes (Signed)
  Subjective No acute events. Doing well. Passing flatus, no bm yet. No n/v. Pain controlled. Voiding on his own s/p foley removal  Objective: Vital signs in last 24 hours: Temp:  [97.6 F (36.4 C)-98.6 F (37 C)] 98 F (36.7 C) (07/18 0620) Pulse Rate:  [61-92] 71 (07/18 0620) Resp:  [10-21] 16 (07/17 1825) BP: (110-151)/(64-99) 129/76 (07/18 0620) SpO2:  [93 %-100 %] 93 % (07/18 0620) Weight:  [103.1 kg] 103.1 kg (07/18 0709) Last BM Date : 11/04/23  Intake/Output from previous day: 07/17 0701 - 07/18 0700 In: 1843.6 [P.O.:800; I.V.:843.6; IV Piggyback:200] Out: 3030 [Urine:2830; Blood:200] Intake/Output this shift: No intake/output data recorded.  Gen: NAD, comfortable CV: RRR Pulm: Normal work of breathing Abd: Soft, NT/ND. Incisions c/d/I without erythema/drainage Ext: SCDs in place  Lab Results: CBC  Recent Labs    11/06/23 0413  WBC 18.0*  HGB 15.5  HCT 47.2  PLT 291   BMET Recent Labs    11/06/23 0413  NA 136  K 4.0  CL 103  CO2 22  GLUCOSE 92  BUN 9  CREATININE 0.69  CALCIUM  8.6*   PT/INR No results for input(s): LABPROT, INR in the last 72 hours. ABG No results for input(s): PHART, HCO3 in the last 72 hours.  Invalid input(s): PCO2, PO2  Studies/Results:  Anti-infectives: Anti-infectives (From admission, onward)    Start     Dose/Rate Route Frequency Ordered Stop   11/05/23 1400  neomycin  (MYCIFRADIN ) tablet 1,000 mg  Status:  Discontinued       Placed in And Linked Group   1,000 mg Oral 3 times per day 11/05/23 0550 11/05/23 0551   11/05/23 1400  metroNIDAZOLE  (FLAGYL ) tablet 1,000 mg  Status:  Discontinued       Placed in And Linked Group   1,000 mg Oral 3 times per day 11/05/23 0550 11/05/23 0551   11/05/23 0600  cefoTEtan  (CEFOTAN ) 2 g in sodium chloride  0.9 % 100 mL IVPB        2 g 200 mL/hr over 30 Minutes Intravenous On call to O.R. 11/05/23 0550 11/05/23 0816        Assessment/Plan: Patient Active  Problem List   Diagnosis Date Noted   S/P laparoscopic-assisted sigmoidectomy 11/05/2023   Acute diverticulitis 12/23/2022   Incidental pulmonary nodule 12/23/2022   Dyspnea on exertion    Angina pectoris (HCC) 01/23/2021   Hyperlipidemia 01/15/2021   Primary hypertension 01/15/2021   Obesity (BMI 30-39.9) 01/15/2021   Anxiety 01/11/2021   Arthritis 01/11/2021   Depression 01/11/2021   History of myocardial infarction 01/11/2021   Restless leg syndrome 01/11/2021   Leukocytosis 07/17/2020    Class: Chronic   OSA (obstructive sleep apnea) 06/28/2020   Essential hypertension 02/23/2020   History of TIA (transient ischemic attack) 2021   Hypertriglyceridemia 05/11/2017   Cigarette smoker 08/29/2015   Coronary artery disease involving native coronary artery of native heart without angina pectoris 08/29/2015   Dyslipidemia 08/29/2015   s/p Procedure(s): CYSTOSCOPY WITH INDOCYANINE GREEN IMAGING (ICG) RESECTION, RECTUM, LOW ANTERIOR, ROBOT-ASSISTED WITH BILATERAL TAP BLOCK, INTRAOPERATIVE ASSESSMENT OF PERFUSION USING FIREFLY, SIGMOIDOSCOPY, FLEXIBLE 11/05/2023  - Doing well - Adv to soft diet - MIVF until reliably tolerating - Ambulate 5x/day - Restart ASA today. Resume Plavix  7/19 if continues to do well - PPX: SQH, SCDs  - We spent time reviewing his procedure, findings, and plans.   LOS: 1 day   Lonni Pizza, MD [facility], [facility]

## 2023-11-07 LAB — CBC
HCT: 47.6 % (ref 39.0–52.0)
Hemoglobin: 15.1 g/dL (ref 13.0–17.0)
MCH: 28 pg (ref 26.0–34.0)
MCHC: 31.7 g/dL (ref 30.0–36.0)
MCV: 88.3 fL (ref 80.0–100.0)
Platelets: 273 K/uL (ref 150–400)
RBC: 5.39 MIL/uL (ref 4.22–5.81)
RDW: 14.3 % (ref 11.5–15.5)
WBC: 12.9 K/uL — ABNORMAL HIGH (ref 4.0–10.5)
nRBC: 0 % (ref 0.0–0.2)

## 2023-11-07 LAB — BASIC METABOLIC PANEL WITH GFR
Anion gap: 7 (ref 5–15)
BUN: 12 mg/dL (ref 6–20)
CO2: 27 mmol/L (ref 22–32)
Calcium: 8.7 mg/dL — ABNORMAL LOW (ref 8.9–10.3)
Chloride: 101 mmol/L (ref 98–111)
Creatinine, Ser: 1.04 mg/dL (ref 0.61–1.24)
GFR, Estimated: >60 mL/min (ref >60)
Glucose, Bld: 87 mg/dL (ref 70–99)
Potassium: 4.4 mmol/L (ref 3.5–5.1)
Sodium: 135 mmol/L (ref 135–145)

## 2023-11-07 NOTE — Progress Notes (Signed)
 2 Days Post-Op   Subjective/Chief Complaint: Wants to go home No n/v Having BMs Pain controlled Ambulating in halls independently   Objective: Vital signs in last 24 hours: Temp:  [97.7 F (36.5 C)-98 F (36.7 C)] 98 F (36.7 C) (07/19 0630) Pulse Rate:  [55-63] 63 (07/19 0630) Resp:  [14-16] 16 (07/19 0630) BP: (123-133)/(77-84) 133/84 (07/19 0630) SpO2:  [97 %] 97 % (07/19 0630) Weight:  [101.3 kg] 101.3 kg (07/19 0500) Last BM Date : 11/07/23  Intake/Output from previous day: 07/18 0701 - 07/19 0700 In: 2240.4 [P.O.:915; I.V.:1325.4] Out: 500 [Urine:500] Intake/Output this shift: Total I/O In: 240 [P.O.:240] Out: -   Alert, non toxic Nonlabored, symm chest rise Reg Soft, min to NT, incision c/d/I No edema  Lab Results:  Recent Labs    11/06/23 0413 11/07/23 0506  WBC 18.0* 12.9*  HGB 15.5 15.1  HCT 47.2 47.6  PLT 291 273   BMET Recent Labs    11/06/23 0413 11/07/23 0506  NA 136 135  K 4.0 4.4  CL 103 101  CO2 22 27  GLUCOSE 92 87  BUN 9 12  CREATININE 0.69 1.04  CALCIUM  8.6* 8.7*   PT/INR No results for input(s): LABPROT, INR in the last 72 hours. ABG No results for input(s): PHART, HCO3 in the last 72 hours.  Invalid input(s): PCO2, PO2  Studies/Results: No results found.  Anti-infectives: Anti-infectives (From admission, onward)    Start     Dose/Rate Route Frequency Ordered Stop   11/05/23 1400  neomycin  (MYCIFRADIN ) tablet 1,000 mg  Status:  Discontinued       Placed in And Linked Group   1,000 mg Oral 3 times per day 11/05/23 0550 11/05/23 0551   11/05/23 1400  metroNIDAZOLE  (FLAGYL ) tablet 1,000 mg  Status:  Discontinued       Placed in And Linked Group   1,000 mg Oral 3 times per day 11/05/23 0550 11/05/23 0551   11/05/23 0600  cefoTEtan  (CEFOTAN ) 2 g in sodium chloride  0.9 % 100 mL IVPB        2 g 200 mL/hr over 30 Minutes Intravenous On call to O.R. 11/05/23 0550 11/05/23 0816        Assessment/Plan: s/p Procedure(s) with comments: CYSTOSCOPY WITH INDOCYANINE GREEN  IMAGING (ICG) (N/A) RESECTION, RECTUM, LOW ANTERIOR, ROBOT-ASSISTED WITH BILATERAL TAP BLOCK, INTRAOPERATIVE ASSESSMENT OF PERFUSION USING FIREFLY, (N/A) - ROBOTIC LOW ANTERIOR RESECTION SIGMOIDOSCOPY, FLEXIBLE (N/A)  Vitals good Labs ok Tolerating diet Hgb stable Having Bowel function Ok to dc. Restart plavix  today  LOS: 2 days    Billy Cummings 11/07/2023

## 2023-11-07 NOTE — Progress Notes (Signed)
Reviewed written d/c instructions w pt and all questions answered. He verbalized understanding. D/ C via w/c w all belongings in stable condition.

## 2023-11-07 NOTE — Plan of Care (Signed)
  Problem: Skin Integrity: Goal: Will show signs of wound healing Outcome: Progressing   

## 2023-11-07 NOTE — Plan of Care (Signed)

## 2023-11-09 LAB — SURGICAL PATHOLOGY

## 2023-11-09 NOTE — Discharge Summary (Addendum)
 Patient ID: Billy Cummings MRN: [id number] DOB/AGE: November 25, 1970 53 y.o.  Admit date: 11/05/2023 Discharge date: 11/07/2023  Discharge Diagnoses Patient Active Problem List   Diagnosis Date Noted   S/P laparoscopic-assisted sigmoidectomy 11/05/2023   Acute diverticulitis 12/23/2022   Incidental pulmonary nodule 12/23/2022   Dyspnea on exertion    Angina pectoris (HCC) 01/23/2021   Hyperlipidemia 01/15/2021   Primary hypertension 01/15/2021   Obesity (BMI 30-39.9) 01/15/2021   Anxiety 01/11/2021   Arthritis 01/11/2021   Depression 01/11/2021   History of myocardial infarction 01/11/2021   Restless leg syndrome 01/11/2021   Leukocytosis 07/17/2020   OSA (obstructive sleep apnea) 06/28/2020   Essential hypertension 02/23/2020   History of TIA (transient ischemic attack) 2021   Hypertriglyceridemia 05/11/2017   Cigarette smoker 08/29/2015   Coronary artery disease involving native coronary artery of native heart without angina pectoris 08/29/2015   Dyslipidemia 08/29/2015    Consultants None  Procedures OR 11/05/23 Robotic assisted low anterior resection with double stapled colorectal anastomosis Intraoperative assessment of perfusion using ICG fluorescence imaging Flexible sigmoidoscopy Bilateral transversus abdominus plane (TAP) blocks  Hospital Course: He was admitted postoperatively and recovered well.  His diet was gradually advanced.  His Foley was removed.  He began having spontaneous bowel function and tolerated regular diet.  On 11/07/2023, he was seen and examined by my partner and was felt to be stable for discharge home.  Please refer to his note for details.  FINDINGS: Mid and distal sigmoid colon with significant fibrosis and thickening consistent with history of diverticulitis.  It is densely adherent in 1 focal area to the back wall of the bladder.  We are able to take this down without getting into the bladder.  An abscess cavity was encountered at this location.   Bladder was backfilled with methylene blue  and demonstrates under tense feeling of the bladder no extravasation or evident colovesical fistula.  Fibrosis extends onto the proximal most aspect of the rectum necessitating going to resection to obtain healthy supple rectal tissue for an anastomosis.  A well perfused, tension free, hemostatic, air tight 29 mm EEA colorectal anastomosis fashioned 14 cm from the anal verge by flexible sigmoidoscopy.   Allergies as of 11/07/2023   No Known Allergies      Medication List     TAKE these medications    aspirin  EC 81 MG tablet Take 81 mg by mouth at bedtime. Swallow whole.   clopidogrel  75 MG tablet Commonly known as: PLAVIX  TAKE ONE TABLET BY MOUTH DAILY AT 6 AM   ezetimibe  10 MG tablet Commonly known as: ZETIA  Take 10 mg by mouth daily.   folic acid  1 MG tablet Commonly known as: FOLVITE  Take 1 mg by mouth daily.   loratadine  10 MG tablet Commonly known as: CLARITIN  Take 10 mg by mouth daily.   losartan  50 MG tablet Commonly known as: COZAAR  Take 1 tablet (50 mg total) by mouth daily.   nitroGLYCERIN  0.4 MG SL tablet Commonly known as: NITROSTAT  Place 0.4 mg under the tongue every 5 (five) minutes as needed for chest pain.   rosuvastatin  40 MG tablet Commonly known as: CRESTOR  Take 40 mg by mouth.   tadalafil 5 MG tablet Commonly known as: CIALIS Take 5 mg by mouth daily as needed for erectile dysfunction.   traMADol  50 MG tablet Commonly known as: Ultram  Take 1 tablet (50 mg total) by mouth every 6 (six) hours as needed for up to 5 days (Postop pain not controlled with Tylenol  and ibuprofen   first).   Vitamin D  (Ergocalciferol ) 1.25 MG (50000 UNIT) Caps capsule Commonly known as: DRISDOL  Take 50,000 Units by mouth once a week.          Follow-up Information     Teresa Lonni HERO, MD Follow up on 11/24/2023.   Specialties: General Surgery, Colon and Rectal Surgery Why: Please arrive by 8:40 a.m. Contact  information: [address] [place] [address] [phone]                 Lonni HERO. Teresa, M.D. [facility]

## 2023-11-11 ENCOUNTER — Ambulatory Visit: Payer: Self-pay | Admitting: Surgery

## 2023-11-16 ENCOUNTER — Encounter (HOSPITAL_COMMUNITY): Payer: Self-pay | Admitting: Urology

## 2024-01-01 ENCOUNTER — Encounter: Payer: Self-pay | Admitting: Cardiology

## 2024-01-25 ENCOUNTER — Encounter (HOSPITAL_COMMUNITY): Payer: Self-pay

## 2024-01-25 ENCOUNTER — Other Ambulatory Visit: Payer: Self-pay

## 2024-01-25 ENCOUNTER — Emergency Department (HOSPITAL_COMMUNITY): Admission: EM | Admit: 2024-01-25 | Discharge: 2024-01-25 | Disposition: A | Discharge status: Home / Self Care

## 2024-01-25 ENCOUNTER — Emergency Department (HOSPITAL_COMMUNITY)

## 2024-01-25 DIAGNOSIS — Z7982 Long term (current) use of aspirin: Secondary | ICD-10-CM | POA: Insufficient documentation

## 2024-01-25 DIAGNOSIS — I2510 Atherosclerotic heart disease of native coronary artery without angina pectoris: Secondary | ICD-10-CM | POA: Diagnosis not present

## 2024-01-25 DIAGNOSIS — N390 Urinary tract infection, site not specified: Secondary | ICD-10-CM | POA: Diagnosis not present

## 2024-01-25 DIAGNOSIS — Z7902 Long term (current) use of antithrombotics/antiplatelets: Secondary | ICD-10-CM | POA: Insufficient documentation

## 2024-01-25 DIAGNOSIS — R310 Gross hematuria: Secondary | ICD-10-CM | POA: Diagnosis not present

## 2024-01-25 DIAGNOSIS — R319 Hematuria, unspecified: Secondary | ICD-10-CM | POA: Diagnosis present

## 2024-01-25 LAB — BASIC METABOLIC PANEL WITH GFR
Anion gap: 13 (ref 5–15)
BUN: 11 mg/dL (ref 6–20)
CO2: 22 mmol/L (ref 22–32)
Calcium: 9.1 mg/dL (ref 8.9–10.3)
Chloride: 103 mmol/L (ref 98–111)
Creatinine, Ser: 0.93 mg/dL (ref 0.61–1.24)
GFR, Estimated: >60 mL/min (ref >60)
Glucose, Bld: 84 mg/dL (ref 70–99)
Potassium: 4.1 mmol/L (ref 3.5–5.1)
Sodium: 137 mmol/L (ref 135–145)

## 2024-01-25 LAB — CBC
HCT: 49.6 % (ref 39.0–52.0)
Hemoglobin: 16.6 g/dL (ref 13.0–17.0)
MCH: 28.6 pg (ref 26.0–34.0)
MCHC: 33.5 g/dL (ref 30.0–36.0)
MCV: 85.5 fL (ref 80.0–100.0)
Platelets: 312 K/uL (ref 150–400)
RBC: 5.8 MIL/uL (ref 4.22–5.81)
RDW: 13.6 % (ref 11.5–15.5)
WBC: 11.8 K/uL — ABNORMAL HIGH (ref 4.0–10.5)
nRBC: 0 % (ref 0.0–0.2)

## 2024-01-25 LAB — URINALYSIS, MICROSCOPIC (REFLEX): RBC / HPF: >50 RBC/hpf (ref 0–5)

## 2024-01-25 LAB — URINALYSIS, ROUTINE W REFLEX MICROSCOPIC

## 2024-01-25 MED ORDER — CEPHALEXIN 500 MG PO CAPS: 500.0000 mg | ORAL_CAPSULE | Freq: Two times a day (BID) | ORAL | 0 refills | Status: AC

## 2024-01-25 NOTE — ED Provider Notes (Signed)
 [place] EMERGENCY DEPARTMENT AT [facility] Provider Note   CSN: [id number] Arrival date & time: 01/25/24  1734     Patient presents with: Hematuria   Billy Cummings is a 53 y.o. male with past medical history of diverticulitis, status post sigmoidectomy, obstructive sleep apnea, coronary artery disease presents to emergency room with complaint of hematuria.  Patient is on Plavix .  He reports that he has had 1 day of hematuria and passing very small clots.  He denies any significant trauma, denies fever, denies dysuria, denies abdominal pain or flank pain. No generalized weakness of syncope.     Hematuria       Prior to Admission medications   Medication Sig Start Date End Date Taking? Authorizing Provider  cephALEXin (KEFLEX) 500 MG capsule Take 1 capsule (500 mg total) by mouth 2 (two) times daily for 7 days. 01/25/24 02/01/24 Yes Odie Edmonds, Warren SAILOR, PA-C  aspirin  EC 81 MG tablet Take 81 mg by mouth at bedtime. Swallow whole.    [provider]  clopidogrel  (PLAVIX ) 75 MG tablet TAKE ONE TABLET BY MOUTH DAILY AT 6 AM 05/10/21   Tobb, Kardie, DO  ezetimibe  (ZETIA ) 10 MG tablet Take 10 mg by mouth daily. 08/03/23   [provider]  folic acid  (FOLVITE ) 1 MG tablet Take 1 mg by mouth daily. 08/03/23   [provider]  loratadine  (CLARITIN ) 10 MG tablet Take 10 mg by mouth daily. 08/11/23   [provider]  losartan  (COZAAR ) 50 MG tablet Take 1 tablet (50 mg total) by mouth daily. 07/17/23 10/26/23  Tobb, Kardie, DO  nitroGLYCERIN  (NITROSTAT ) 0.4 MG SL tablet Place 0.4 mg under the tongue every 5 (five) minutes as needed for chest pain. 01/15/23   [provider]  rosuvastatin  (CRESTOR ) 40 MG tablet Take 40 mg by mouth. 06/19/23   [provider]  tadalafil (CIALIS) 5 MG tablet Take 5 mg by mouth daily as needed for erectile dysfunction. 09/01/23   [provider]  Vitamin D , Ergocalciferol , (DRISDOL ) 1.25 MG (50000 UNIT)  CAPS capsule Take 50,000 Units by mouth once a week. 01/15/23   [provider]    Allergies: Patient has no known allergies.    Review of Systems  Genitourinary:  Positive for hematuria.    Updated Vital Signs BP 130/80 (BP Location: Left Arm)   Pulse 88   Temp 98 F (36.7 C) (Oral)   Resp 16   Ht 6' 2 (1.88 m)   Wt 95.3 kg   SpO2 95%   BMI 26.96 kg/m   Physical Exam Vitals and nursing note reviewed.  Constitutional:      General: He is not in acute distress.    Appearance: He is not toxic-appearing.  HENT:     Head: Normocephalic and atraumatic.  Eyes:     General: No scleral icterus.    Conjunctiva/sclera: Conjunctivae normal.  Cardiovascular:     Rate and Rhythm: Normal rate and regular rhythm.     Pulses: Normal pulses.     Heart sounds: Normal heart sounds.  Pulmonary:     Effort: Pulmonary effort is normal. No respiratory distress.     Breath sounds: Normal breath sounds.  Abdominal:     General: Abdomen is flat. Bowel sounds are normal.     Palpations: Abdomen is soft.     Tenderness: There is no abdominal tenderness.  Musculoskeletal:     Right lower leg: No edema.     Left lower leg: No  edema.  Skin:    General: Skin is warm and dry.     Findings: No lesion.  Neurological:     General: No focal deficit present.     Mental Status: He is alert and oriented to person, place, and time. Mental status is at baseline.     (all labs ordered are listed, but only abnormal results are displayed) Labs Reviewed  URINALYSIS, ROUTINE W REFLEX MICROSCOPIC - Abnormal; Notable for the following components:      Result Value   Color, Urine RED (*)    APPearance HAZY (*)    Glucose, UA   (*)    Value: TEST NOT REPORTED DUE TO COLOR INTERFERENCE OF URINE PIGMENT   Hgb urine dipstick   (*)    Value: TEST NOT REPORTED DUE TO COLOR INTERFERENCE OF URINE PIGMENT   Bilirubin Urine   (*)    Value: TEST NOT REPORTED DUE TO COLOR INTERFERENCE OF URINE PIGMENT    Ketones, ur   (*)    Value: TEST NOT REPORTED DUE TO COLOR INTERFERENCE OF URINE PIGMENT   Protein, ur   (*)    Value: TEST NOT REPORTED DUE TO COLOR INTERFERENCE OF URINE PIGMENT   Nitrite   (*)    Value: TEST NOT REPORTED DUE TO COLOR INTERFERENCE OF URINE PIGMENT   Leukocytes,Ua   (*)    Value: TEST NOT REPORTED DUE TO COLOR INTERFERENCE OF URINE PIGMENT   All other components within normal limits  CBC - Abnormal; Notable for the following components:   WBC 11.8 (*)    All other components within normal limits  URINALYSIS, MICROSCOPIC (REFLEX) - Abnormal; Notable for the following components:   Bacteria, UA RARE (*)    All other components within normal limits  URINE CULTURE  BASIC METABOLIC PANEL WITH GFR    EKG: None  Radiology: CT ABDOMEN PELVIS WO CONTRAST Result Date: 01/25/2024 CLINICAL DATA:  Abdominal pain, acute, nonlocalized Hematuria. EXAM: CT ABDOMEN AND PELVIS WITHOUT CONTRAST TECHNIQUE: Multidetector CT imaging of the abdomen and pelvis was performed following the standard protocol without IV contrast. RADIATION DOSE REDUCTION: This exam was performed according to the departmental dose-optimization program which includes automated exposure control, adjustment of the mA and/or kV according to patient size and/or use of iterative reconstruction technique. COMPARISON:  CT chest 05/18/2023 FINDINGS: Lower chest: Mosaic attenuation of the lung bases with interlobular septal wall thickening. Coronary artery calcification. Hepatobiliary: Mild hepatomegaly. Diffusely hypodense hepatic parenchyma compared to the spleen consistent with hepatic steatosis. Slight variant perfusion versus focal fatty sparing of left lateral hepatic lobe again noted (2:22). No focal liver abnormality. No gallstones, gallbladder wall thickening, or pericholecystic fluid. No biliary dilatation. Pancreas: No focal lesion. Normal pancreatic contour. No surrounding inflammatory changes. No main pancreatic  ductal dilatation. Spleen: Normal in size without focal abnormality. Adrenals/Urinary Tract: No adrenal nodule bilaterally. Bilateral kidneys enhance symmetrically. No hydronephrosis. No hydroureter. The urinary bladder is decompressed and grossly unremarkable. Stomach/Bowel: Rectosigmoid surgical changes. Stomach is within normal limits. No evidence of bowel wall thickening or dilatation. Stool throughout the ascending and descending colon. Few scattered colonic diverticula. Appendix appears normal. Vascular/Lymphatic: Stable aneurysmal infrarenal abdominal aorta aneurysm measuring 3.2 x 2.8 cm . Severe atherosclerotic plaque of the aorta and its branches. No abdominal, pelvic, or inguinal lymphadenopathy. Reproductive: Prostate is unremarkable. Other: No intraperitoneal free fluid. No intraperitoneal free gas. No organized fluid collection. Musculoskeletal: L5-S1 intrarenal disc space vacuum phenomenon posterior disc osteophyte complex formation. IMPRESSION: 1. Mosaic  attenuation of the lung bases with interlobular septal wall thickening. Question pulmonary edema versus smaller rated. Correlate with chest x-ray PA and lateral view further evaluation. 2. Under distended urinary bladder that is grossly unremarkable and incompletely evaluated. 3. Colonic diverticulosis with no acute diverticulitis. 4. Hepatomegaly and hepatic steatosis. 5. Stable aneurysmal infrarenal abdominal aorta aneurysm measuring 3.2 x 2.8 cm. Recommend follow-up ultrasound every 3 years. (Ref.: J Vasc Surg. 2018; 67:2-77 and J Am Coll Radiol 2013;10(10):789-794.). Aortic aneurysm NOS (ICD10-I71.9). 6. Aortic Atherosclerosis (ICD10-I70.0) -including coronary artery calcification. Electronically Signed   By: Morgane  Naveau M.D.   On: 01/25/2024 20:30     Procedures   Medications Ordered in the ED - No data to display                                  Medical Decision Making Amount and/or Complexity of Data Reviewed Labs:  ordered. Radiology: ordered.  Risk Prescription drug management.   This patient presents to the ED for concern of hematuria, this involves an extensive number of treatment options, and is a complaint that carries with it a high risk of complications and morbidity.  The differential diagnosis includes mass, UTI, medication side effect, fistula    Lab Tests:  I personally interpreted labs.  The pertinent results include:   WBC with mild white count of 11.8, hemoglobin is 16.6 and BMP unremarkable. UA is untestable due to gross hematuria.  It does show over 50 RBCs, 6-10 white blood cells and rare bacteria.  I did send this to culture.   Imaging Studies ordered:  I ordered imaging studies including CT scan of abdomen and pelvis I independently visualized and interpreted imaging which showed under distended urinary bladder that is grossly unremarkable, mosaic attenuation to lower lungs, diverticulosis with no diverticulitis, hepatomegaly and stable aneurysm. I agree with the radiologist interpretation Due to mosaic attenuation recommending correlation and chest x-ray.  I did discuss this with patient who does not endorse any chest pain shortness of breath or cough and prefers to follow-up outpatient with this.   Cardiac Monitoring: / EKG:  The patient was maintained on a cardiac monitor.     Problem List / ED Course / Critical interventions / Medication management  Patient presents emergency room with complaint of hematuria.  He is not having any significant abdominal pain or flank pain.  No urinary retention symptoms.  No dysuria or frequency.  Patient is hemodynamically stable and is well-appearing. No sign of systemic illness.  Hemoglobin is 16.6 here.  Urine shows gross hematuria and has rare bacteria.  I will send this for culture and treat with antibiotic although does not endorse any urinary symptoms at this time.  His CT scan is grossly unremarkable bladder, ureters and kidneys. I  did discuss other incidental findings.  I have reviewed the patients home medicines and have made adjustments as needed. I do feel the patient is stable for discharge with close outpatient follow-up.  I did discuss that he needs to follow-up with urology for his hematuria.        Final diagnoses:  Gross hematuria  Lower urinary tract infectious disease    ED Discharge Orders          Ordered    cephALEXin (KEFLEX) 500 MG capsule  2 times daily        01/25/24 2128  Shermon Warren LOISE DEVONNA 01/25/24 2324    Ula Prentice SAUNDERS, MD 01/25/24 [phone]

## 2024-01-25 NOTE — Discharge Instructions (Addendum)
 Your urine has blood and small amount of bacteria thus I will start you on an antibiotic, take as prescribed. CT scan overall is reassuring.  You do need to follow-up with urology for further evaluation of this.  Please call to schedule appointment. You could have Chest x-ray/CT chest in the outpatient setting with PCP.  Return to ER with new or worsening symptoms.

## 2024-01-25 NOTE — ED Triage Notes (Signed)
 Patient has been urinating blood since this morning at 10am. No abdominal pain. No burning urination. No history of prostate problems. Is taking a blood thinner. Clots in urine.

## 2024-01-27 LAB — URINE CULTURE: Culture: NO GROWTH
